# Patient Record
Sex: Female | Born: 1990 | Race: Black or African American | Hispanic: No | Marital: Single | Smoking: Current some day smoker
Health system: Southern US, Community
[De-identification: ages and names within clinical notes are randomized; demographics above are authoritative.]

## PROBLEM LIST (undated history)

## (undated) ENCOUNTER — Inpatient Hospital Stay (HOSPITAL_COMMUNITY): Payer: Self-pay

## (undated) DIAGNOSIS — O2340 Unspecified infection of urinary tract in pregnancy, unspecified trimester: Secondary | ICD-10-CM

## (undated) DIAGNOSIS — G8929 Other chronic pain: Secondary | ICD-10-CM

## (undated) DIAGNOSIS — F99 Mental disorder, not otherwise specified: Secondary | ICD-10-CM

## (undated) DIAGNOSIS — Z862 Personal history of diseases of the blood and blood-forming organs and certain disorders involving the immune mechanism: Secondary | ICD-10-CM

## (undated) DIAGNOSIS — IMO0001 Reserved for inherently not codable concepts without codable children: Secondary | ICD-10-CM

## (undated) DIAGNOSIS — F32A Depression, unspecified: Secondary | ICD-10-CM

## (undated) DIAGNOSIS — F329 Major depressive disorder, single episode, unspecified: Secondary | ICD-10-CM

## (undated) DIAGNOSIS — D649 Anemia, unspecified: Secondary | ICD-10-CM

## (undated) DIAGNOSIS — O139 Gestational [pregnancy-induced] hypertension without significant proteinuria, unspecified trimester: Secondary | ICD-10-CM

## (undated) DIAGNOSIS — M549 Dorsalgia, unspecified: Secondary | ICD-10-CM

## (undated) DIAGNOSIS — K219 Gastro-esophageal reflux disease without esophagitis: Secondary | ICD-10-CM

## (undated) DIAGNOSIS — Z5189 Encounter for other specified aftercare: Secondary | ICD-10-CM

## (undated) DIAGNOSIS — Z8759 Personal history of other complications of pregnancy, childbirth and the puerperium: Secondary | ICD-10-CM

## (undated) HISTORY — PX: FINGER SURGERY: SHX640

---
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
## 2011-01-03 IMAGING — US US OB COMP LESS 14 WK
1 series · 14 of 28 positions shown · non-contrast
Comparison: 09/24/2009

CLINICAL DATA: Early gestation with vaginal bleeding

OBSTETRIC <14 WK ULTRASOUND
TECHNIQUE: Transabdominal ultrasound was performed for evaluation
of the gestation as well as the maternal uterus and adnexal
regions.

[Series 1: us ob comp less 14 wk · 0.25mm/px · 14 of 34 slices shown]
[im 2/34]
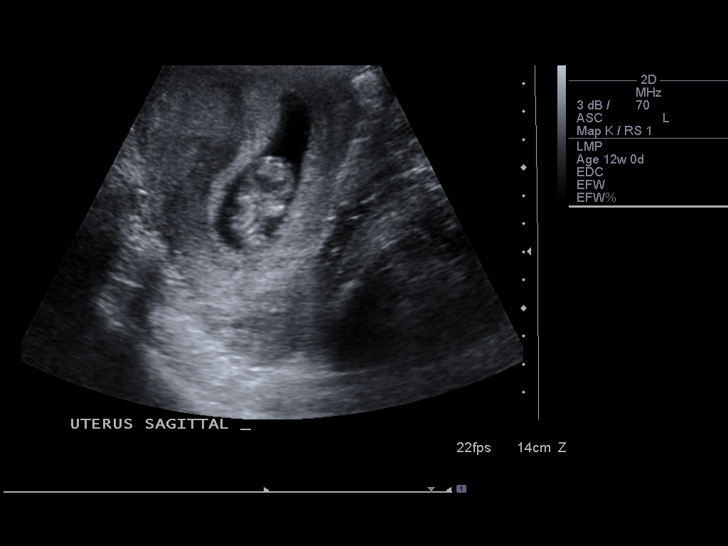
[im 4/34]
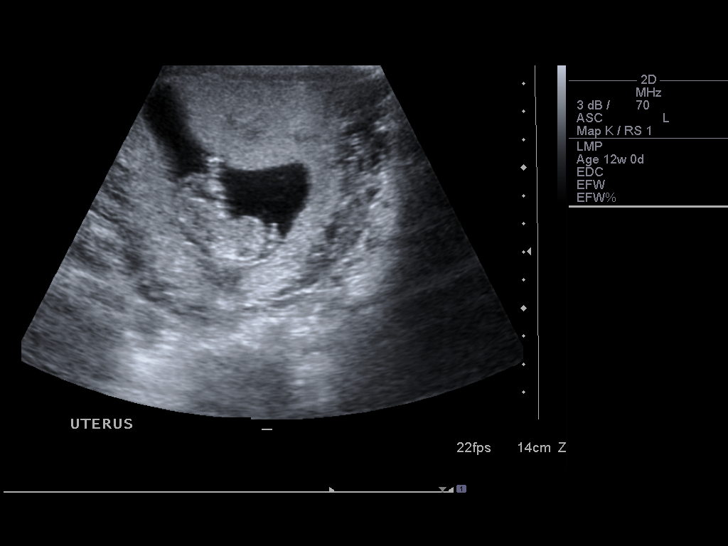
[im 7/34]
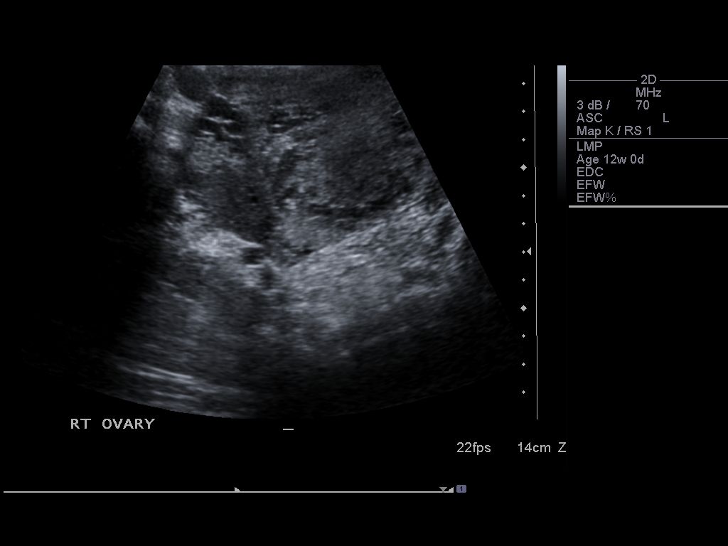
[im 9/34]
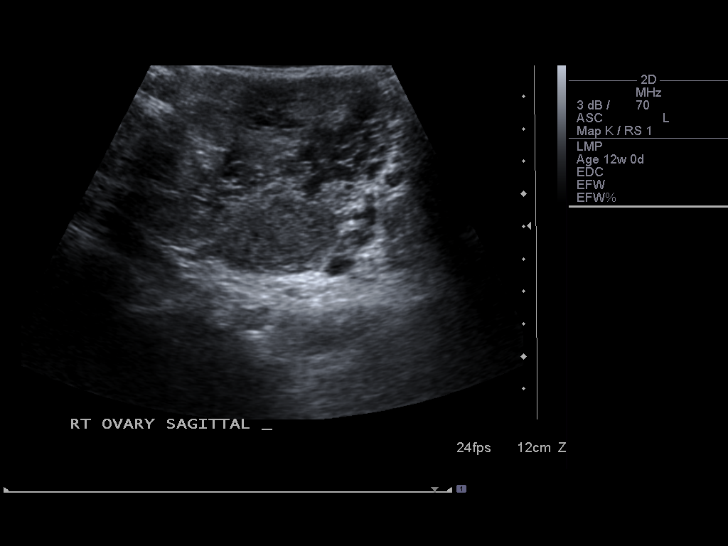
[im 12/34]
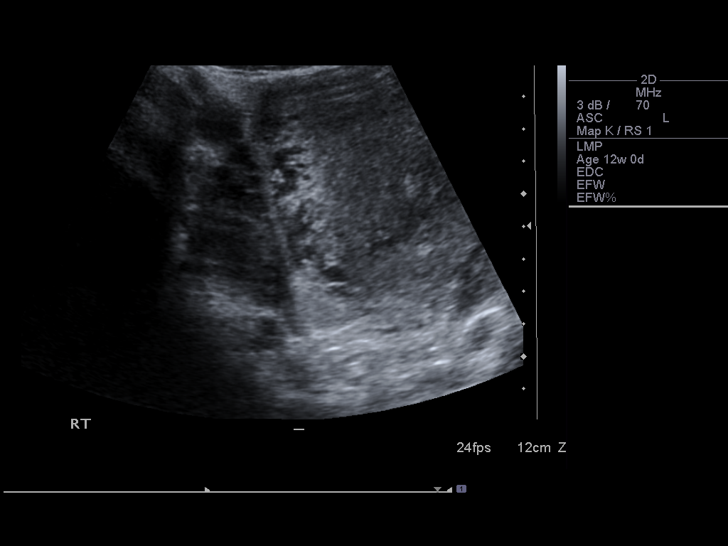
[im 14/34]
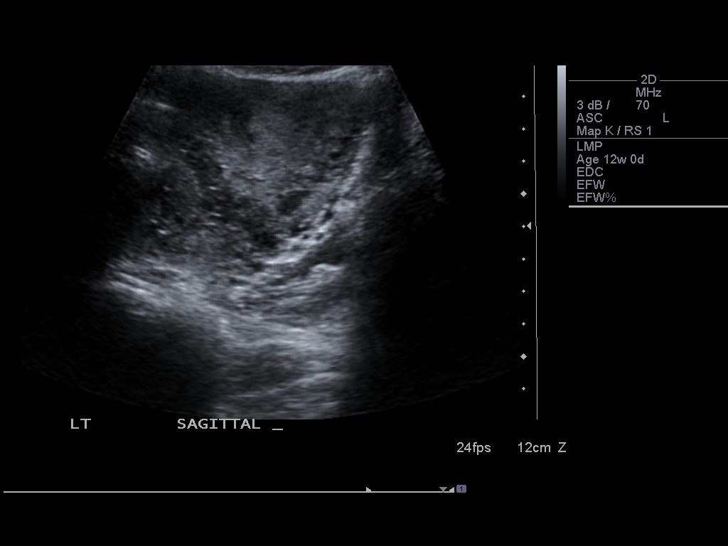
[im 16/34]
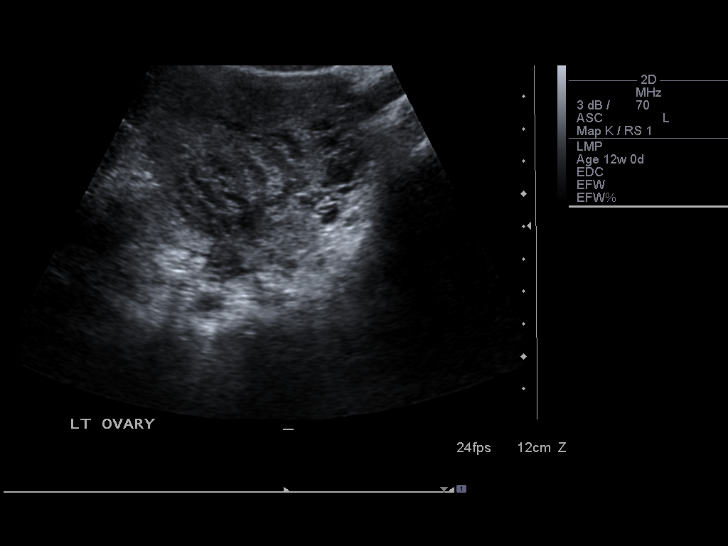
[im 19/34]
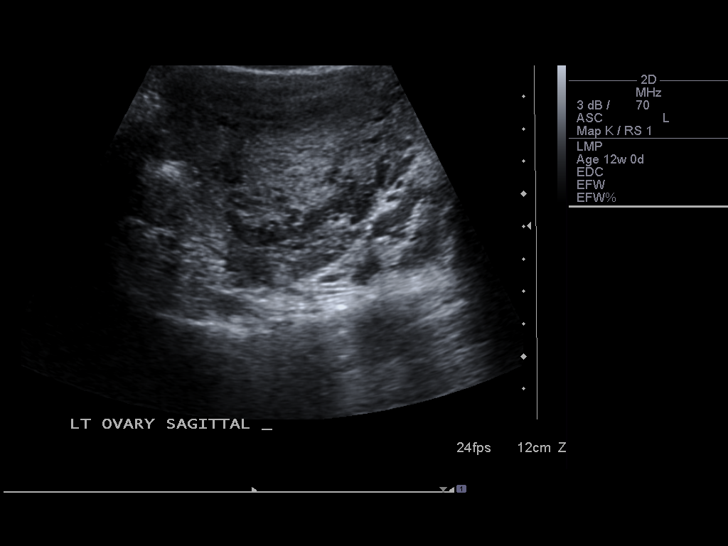
[im 21/34]
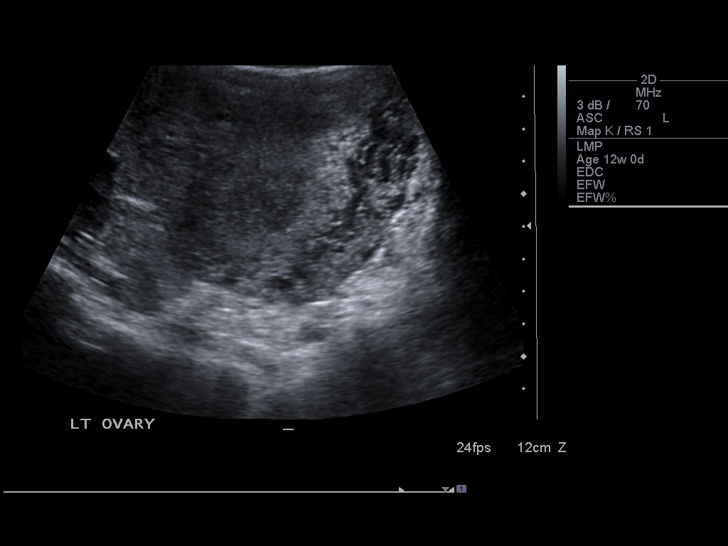
[im 24/34]
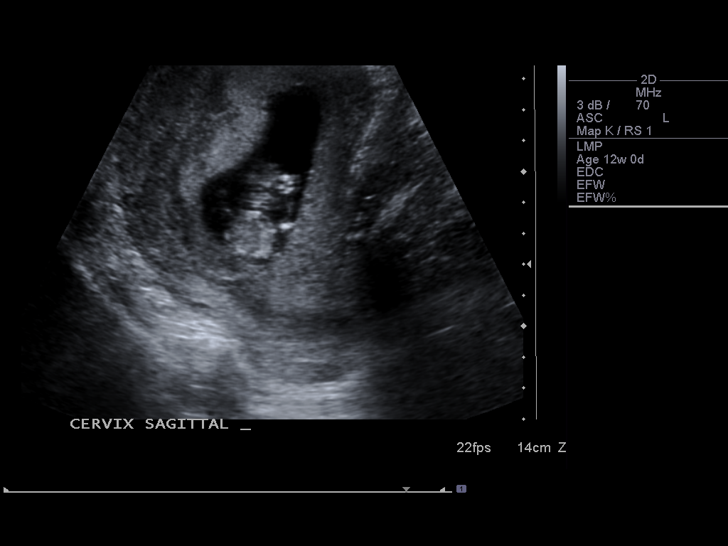
[im 26/34]
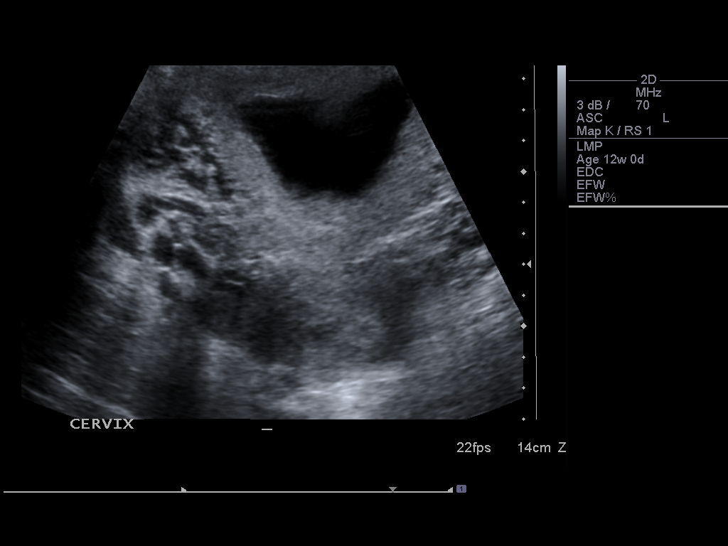
[im 29/34]
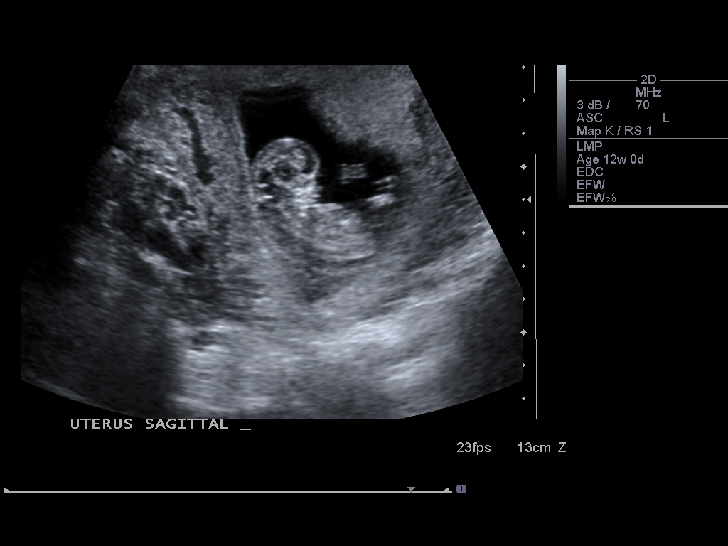
[im 31/34]
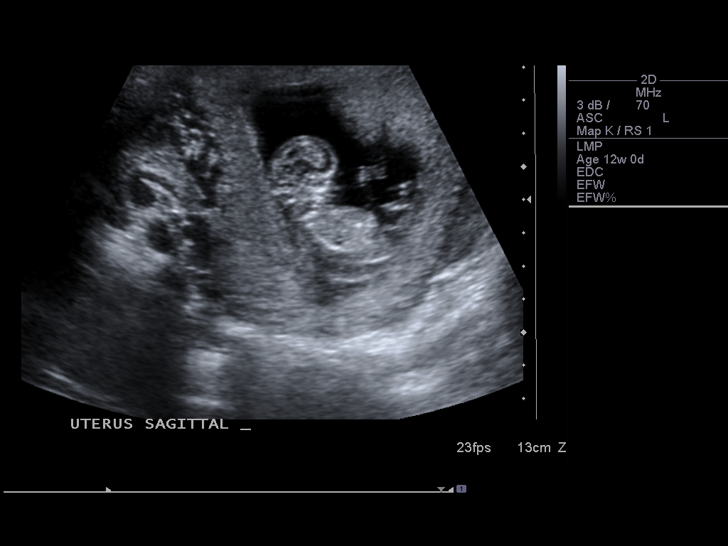
[im 34/34]
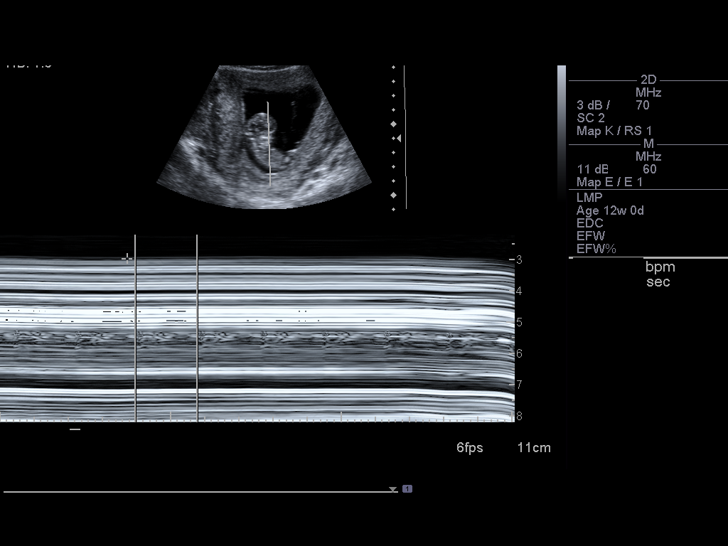

[14 of 28 positions shown; findings below may reference images not displayed]

Intrauterine gestational sac: Single
Yolk sac: Not seen
Embryo: Seen
Cardiac Activity: Seen
Heart Rate: 165 bpm

MSD:  mm  w  d
CRL:  44.8 mm         11w  2d       US EDC: 05/11/2010

Maternal uterus/Adnexae:
The ovaries appear normal with the left ovary measuring 3.5 x 1.1 x
.9 cm and the right ovary measuring 2.1 x 3.4 x 2.4 cm.  No pelvic
fluid is seen. No subchorionic hemorrhage is seen.
IMPRESSION: Single living intrauterine pregnancy demonstrating an estimated
gestational age by crown-rump length of 11 weeks 2 days.  This
correlates with expected EGA by LMP of 12w 0d.

No subchorionic hemorrhage is noted in this patient with history of
vaginal bleeding.

Normal ovaries.

## 2011-02-20 IMAGING — US US OB COMP +14 WK
1 series · 14 of 28 positions shown · non-contrast
Comparison: none

OBSTETRICAL ULTRASOUND:
 This ultrasound exam was performed in the [HOSPITAL] Ultrasound Department.  The OB US report was generated in the AS system, and faxed to the ordering physician.  This report is also available in [HOSPITAL]?s [employer] and in [REDACTED] PACS.

[Series 1: us ob comp +14 wk · 0.27mm/px · 61 acquisitions, 14 frames shown]
[im 3/61]
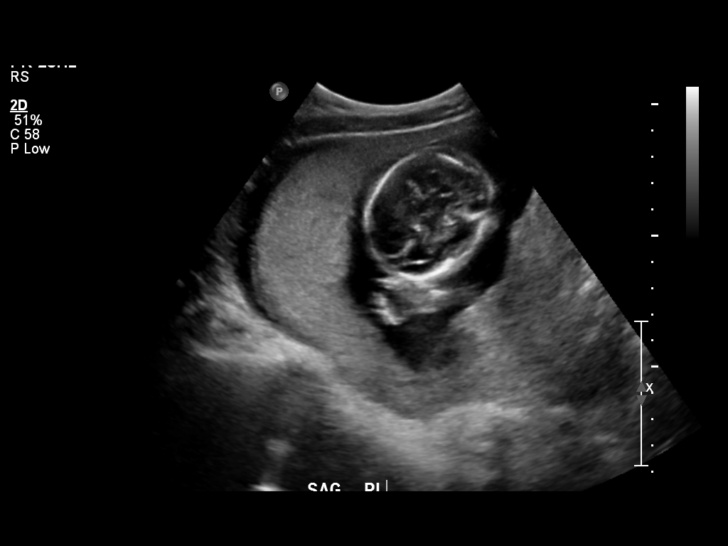
[im 7/61]
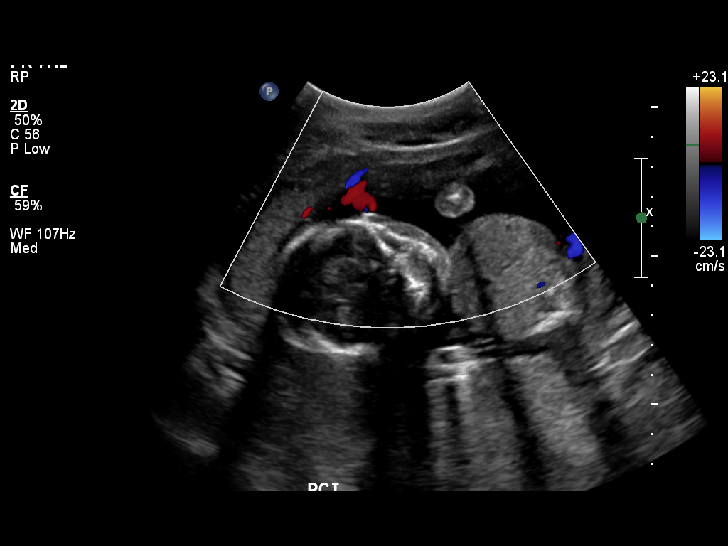
[im 12/61]
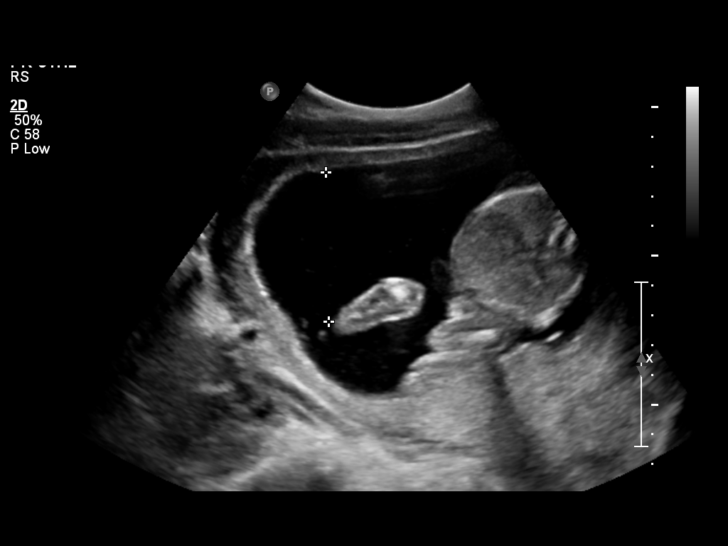
[im 16/61]
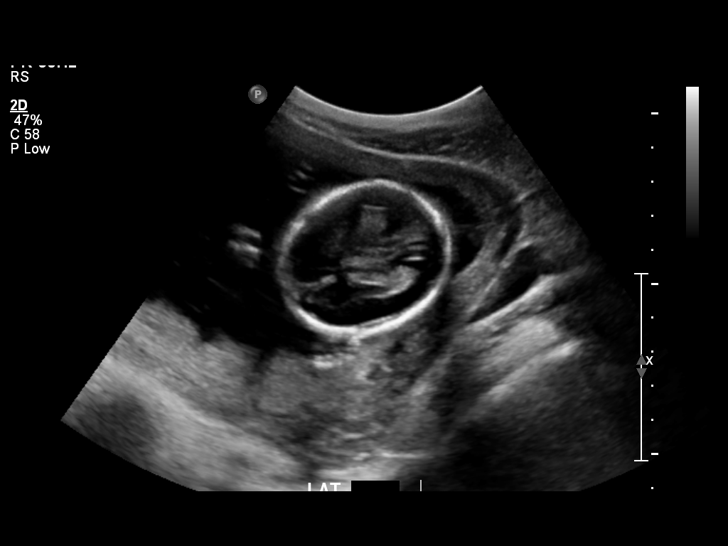
[im 21/61]
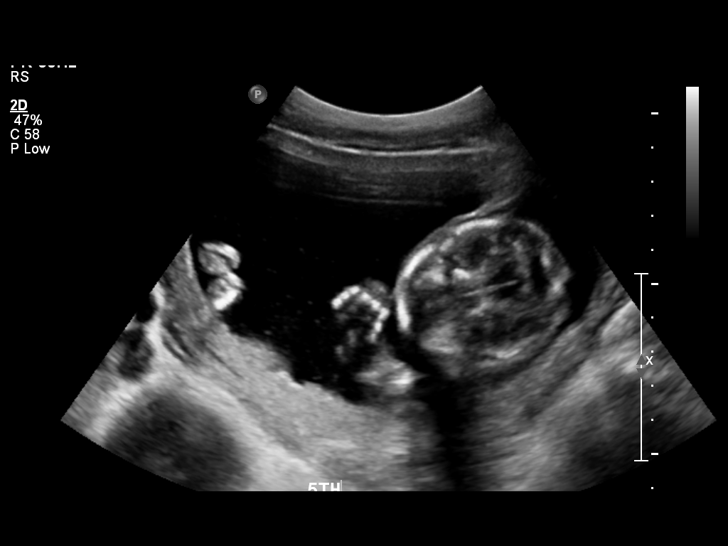
[im 25/61]
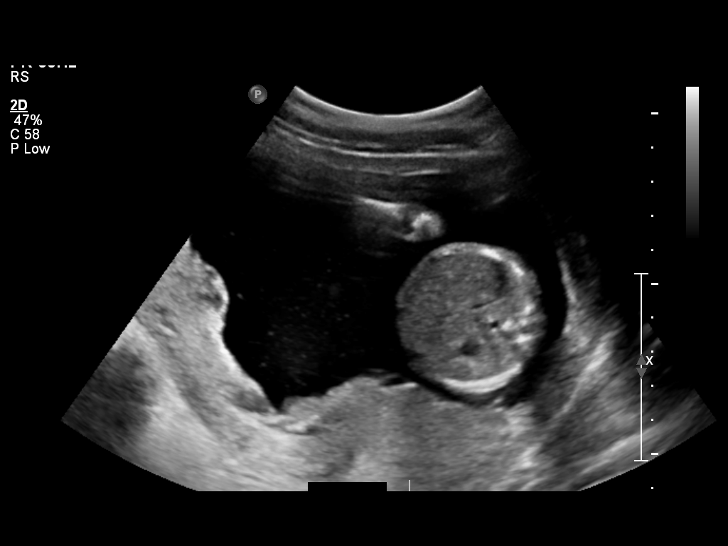
[im 29/61]
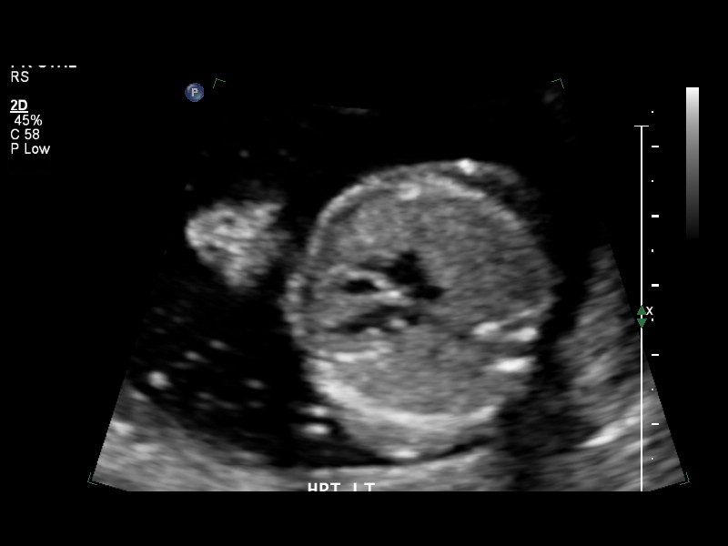
[im 34/61]
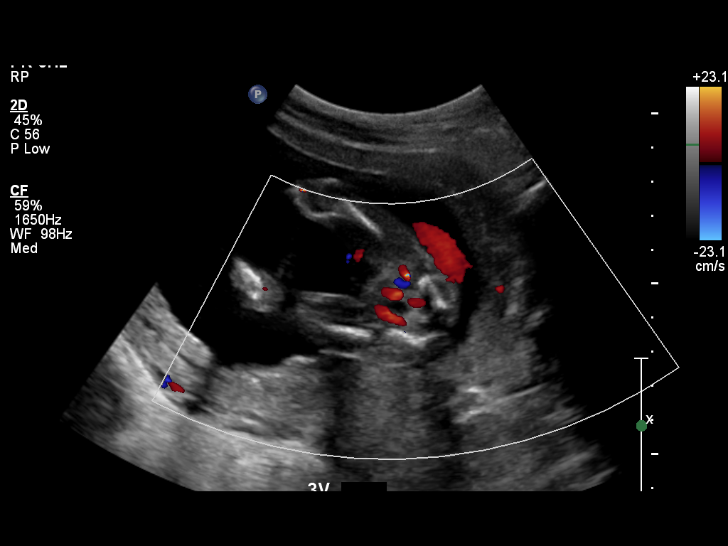
[im 38/61]
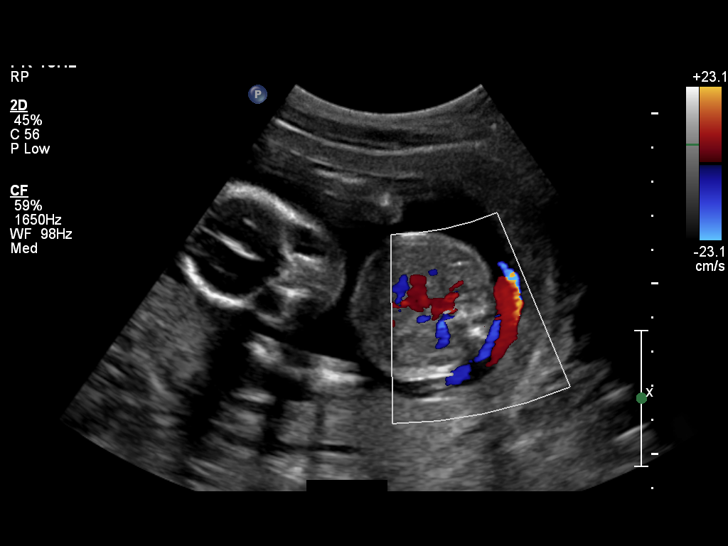
[im 43/61]
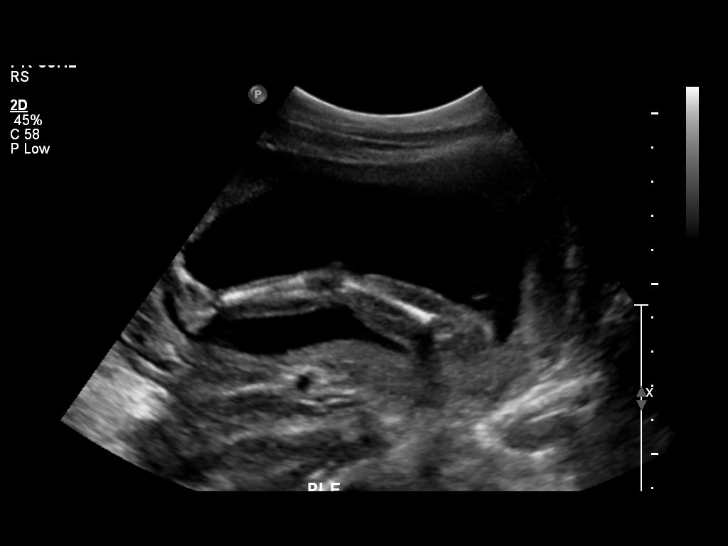
[im 47/61]
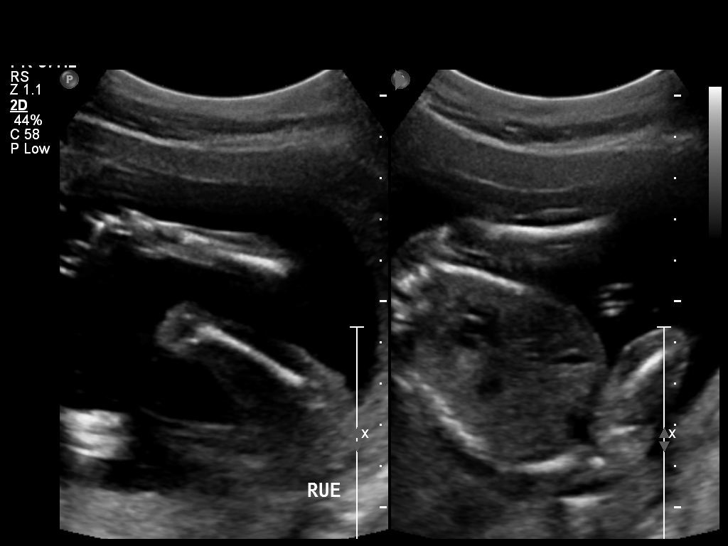
[im 52/61]
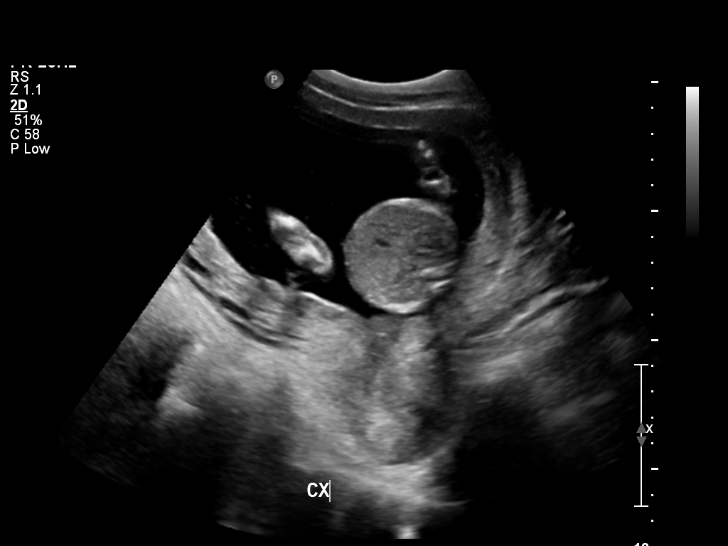
[im 56/61]
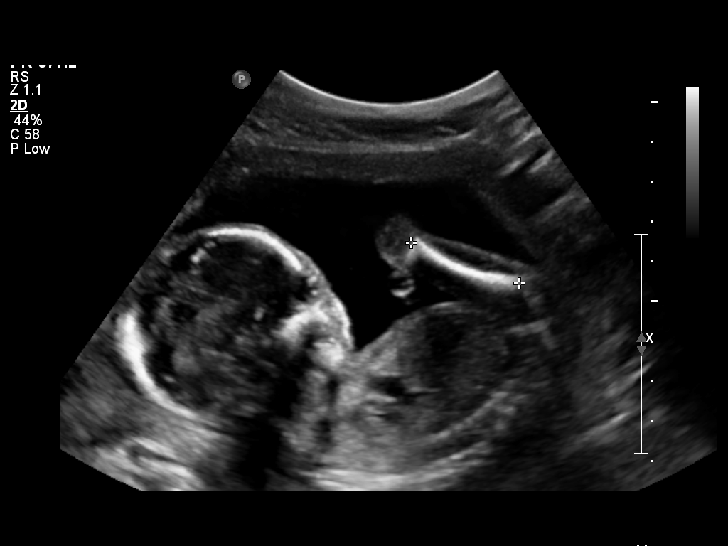
[im 61/61]
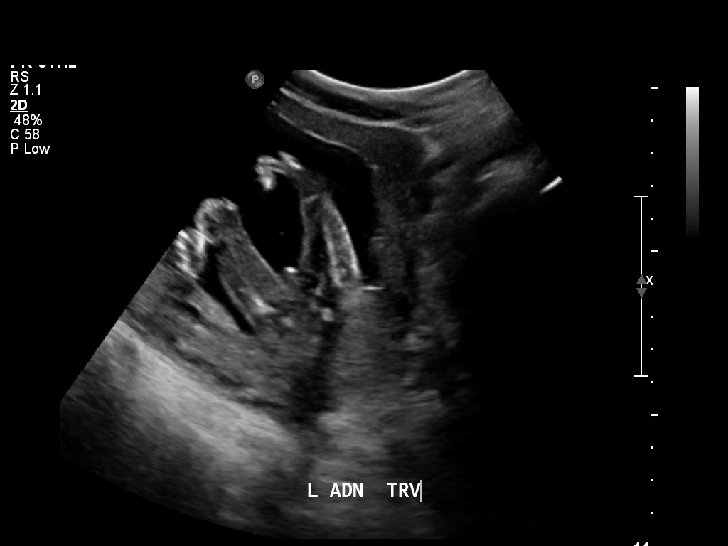

[14 of 28 positions shown; findings below may reference images not displayed]

IMPRESSION: See AS Obstetric US report.

## 2011-04-03 LAB — STREP A DNA PROBE

## 2011-04-08 LAB — URINE MICROSCOPIC-ADD ON

## 2011-04-08 LAB — URINALYSIS, ROUTINE W REFLEX MICROSCOPIC
Glucose, UA: NEGATIVE
Specific Gravity, Urine: 1.02
pH: 5.5

## 2011-04-08 LAB — DIFFERENTIAL
Basophils Relative: 0
Eosinophils Absolute: 0.1
Lymphs Abs: 2.9
Monocytes Absolute: 0.5
Monocytes Relative: 7
Neutro Abs: 3.4

## 2011-04-08 LAB — COMPREHENSIVE METABOLIC PANEL
ALT: 19
Albumin: 3.9
Alkaline Phosphatase: 97
Potassium: 3.9
Sodium: 137
Total Protein: 7.6

## 2011-04-08 LAB — CBC
Platelets: 313
RDW: 15.6 — ABNORMAL HIGH

## 2011-04-09 LAB — URINE MICROSCOPIC-ADD ON

## 2011-04-09 LAB — URINALYSIS, ROUTINE W REFLEX MICROSCOPIC
Glucose, UA: NEGATIVE
Hgb urine dipstick: NEGATIVE
Protein, ur: NEGATIVE
pH: 7

## 2011-04-15 LAB — URINALYSIS, ROUTINE W REFLEX MICROSCOPIC
Bilirubin Urine: NEGATIVE
Ketones, ur: NEGATIVE
Nitrite: NEGATIVE
Protein, ur: NEGATIVE
Urobilinogen, UA: 0.2
pH: 5.5

## 2011-06-20 ENCOUNTER — Emergency Department (HOSPITAL_COMMUNITY)
Admission: EM | Admit: 2011-06-20 | Discharge: 2011-06-21 | Discharge status: Against Medical Advice | Payer: Self-pay | Attending: Emergency Medicine | Admitting: Emergency Medicine

## 2011-06-20 ENCOUNTER — Encounter: Payer: Self-pay | Admitting: *Deleted

## 2011-06-20 DIAGNOSIS — M549 Dorsalgia, unspecified: Secondary | ICD-10-CM | POA: Insufficient documentation

## 2011-06-20 DIAGNOSIS — R109 Unspecified abdominal pain: Secondary | ICD-10-CM | POA: Insufficient documentation

## 2011-06-20 LAB — PREGNANCY, URINE: Preg Test, Ur: POSITIVE

## 2011-06-20 LAB — URINALYSIS, ROUTINE W REFLEX MICROSCOPIC
Bilirubin Urine: NEGATIVE
Glucose, UA: NEGATIVE mg/dL
Hgb urine dipstick: NEGATIVE
Specific Gravity, Urine: 1.024 (ref 1.005–1.030)
pH: 6 (ref 5.0–8.0)

## 2011-06-20 LAB — URINE MICROSCOPIC-ADD ON

## 2011-06-20 NOTE — ED Notes (Signed)
Pt c/o lower back and abd pain; denies dysuria; no period since Sept

## 2011-06-21 NOTE — ED Notes (Signed)
Pt called for rooming x5, no response.  Assumed that pt is in room as there is no response.  Advised that pt is not in room by Garrison, PA

## 2011-07-14 NOTE — L&D Delivery Note (Signed)
Delivery Note At 6:04 AM a viable and healthy female was delivered via Vaginal, Spontaneous Delivery (Presentation: ROA ).  APGAR: 8 ,9 ; weight pending .   Placenta status: Intact, Spontaneous.  Cord:  with the following complications:  Anesthesia:  None Episiotomy: None Lacerations: Intact Suture Repair: none Est. Blood Loss (mL): 300  Mom to postpartum.  Baby to nursery-stable.  Infant skin to skin with mother immediately following delivery.  LEFTWICH-KIRBY, Madhavi Hamblen 12/11/2011, 6:23 AM

## 2011-07-21 ENCOUNTER — Encounter (HOSPITAL_COMMUNITY): Payer: Self-pay

## 2011-07-21 ENCOUNTER — Inpatient Hospital Stay (HOSPITAL_COMMUNITY)
Admission: AD | Admit: 2011-07-21 | Discharge: 2011-07-21 | Disposition: A | Discharge status: Home / Self Care | Payer: Self-pay | Source: Ambulatory Visit | Attending: Obstetrics & Gynecology | Admitting: Obstetrics & Gynecology

## 2011-07-21 DIAGNOSIS — Z3201 Encounter for pregnancy test, result positive: Secondary | ICD-10-CM

## 2011-07-21 DIAGNOSIS — O2390 Unspecified genitourinary tract infection in pregnancy, unspecified trimester: Secondary | ICD-10-CM | POA: Insufficient documentation

## 2011-07-21 DIAGNOSIS — B9689 Other specified bacterial agents as the cause of diseases classified elsewhere: Secondary | ICD-10-CM

## 2011-07-21 DIAGNOSIS — R109 Unspecified abdominal pain: Secondary | ICD-10-CM | POA: Insufficient documentation

## 2011-07-21 DIAGNOSIS — A499 Bacterial infection, unspecified: Secondary | ICD-10-CM

## 2011-07-21 DIAGNOSIS — N760 Acute vaginitis: Secondary | ICD-10-CM

## 2011-07-21 HISTORY — DX: Gestational (pregnancy-induced) hypertension without significant proteinuria, unspecified trimester: O13.9

## 2011-07-21 LAB — URINE MICROSCOPIC-ADD ON

## 2011-07-21 LAB — URINALYSIS, ROUTINE W REFLEX MICROSCOPIC
Ketones, ur: NEGATIVE mg/dL
Nitrite: NEGATIVE
Protein, ur: NEGATIVE mg/dL
Urobilinogen, UA: 0.2 mg/dL (ref 0.0–1.0)

## 2011-07-21 LAB — URINE CULTURE: Culture  Setup Time: 201301090120

## 2011-07-21 LAB — WET PREP, GENITAL: Yeast Wet Prep HPF POC: NONE SEEN

## 2011-07-21 MED ORDER — METRONIDAZOLE 500 MG PO TABS: 500.0000 mg | ORAL_TABLET | Freq: Two times a day (BID) | ORAL | Status: AC

## 2011-07-21 NOTE — Progress Notes (Signed)
Patient states she has not had a period since September. Has been having lower abdominal pain off and on for three days. No bleeding or vaginal discharge at this time.

## 2011-07-21 NOTE — Progress Notes (Signed)
Pt states she has spotted intermittently since her LMP in September, last episode was Dec 2 when she spotted for 3 days, no bleeding today.
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
## 2011-07-22 NOTE — ED Provider Notes (Signed)
Attestation of Attending Supervision of Advanced Practitioner: Evaluation and management procedures were performed by the PA/NP/CNM/OB Fellow under my supervision/collaboration. Chart reviewed, and agree with management and plan.  Jaynie Collins, M.D. 07/22/2011 9:29 AM

## 2011-09-07 ENCOUNTER — Emergency Department (HOSPITAL_COMMUNITY): Payer: No Typology Code available for payment source

## 2011-09-07 ENCOUNTER — Emergency Department (HOSPITAL_COMMUNITY)
Admission: EM | Admit: 2011-09-07 | Discharge: 2011-09-07 | Disposition: A | Discharge status: Home / Self Care | Payer: No Typology Code available for payment source | Attending: Emergency Medicine | Admitting: Emergency Medicine

## 2011-09-07 ENCOUNTER — Encounter (HOSPITAL_COMMUNITY): Payer: Self-pay

## 2011-09-07 DIAGNOSIS — M545 Low back pain, unspecified: Secondary | ICD-10-CM | POA: Insufficient documentation

## 2011-09-07 DIAGNOSIS — M25559 Pain in unspecified hip: Secondary | ICD-10-CM | POA: Insufficient documentation

## 2011-09-07 DIAGNOSIS — M79609 Pain in unspecified limb: Secondary | ICD-10-CM | POA: Insufficient documentation

## 2011-09-07 DIAGNOSIS — M546 Pain in thoracic spine: Secondary | ICD-10-CM | POA: Insufficient documentation

## 2011-09-07 DIAGNOSIS — R109 Unspecified abdominal pain: Secondary | ICD-10-CM | POA: Insufficient documentation

## 2011-09-07 DIAGNOSIS — O99891 Other specified diseases and conditions complicating pregnancy: Secondary | ICD-10-CM | POA: Insufficient documentation

## 2011-09-07 DIAGNOSIS — R10819 Abdominal tenderness, unspecified site: Secondary | ICD-10-CM | POA: Insufficient documentation

## 2011-09-07 DIAGNOSIS — M542 Cervicalgia: Secondary | ICD-10-CM | POA: Insufficient documentation

## 2011-09-07 MED ORDER — ACETAMINOPHEN 325 MG PO TABS
650.0000 mg | ORAL_TABLET | Freq: Once | ORAL | Status: AC
  Administered 2011-09-07: 650 mg via ORAL
  Filled 2011-09-07: qty 2
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
## 2011-09-07 NOTE — ED Notes (Signed)
Ob nurse at bs. Pt fetal monitoring being done. Pt remains with c collar on. Voices no c/o any.
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
## 2011-09-07 NOTE — ED Notes (Signed)
Ob nurse to delivering fetal heart monitoring strip to ob MD.
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
## 2011-10-05 ENCOUNTER — Emergency Department (HOSPITAL_COMMUNITY)
Admission: EM | Admit: 2011-10-05 | Discharge: 2011-10-05 | Disposition: A | Discharge status: Home / Self Care | Payer: No Typology Code available for payment source | Attending: Emergency Medicine | Admitting: Emergency Medicine

## 2011-10-05 ENCOUNTER — Encounter (HOSPITAL_COMMUNITY): Payer: Self-pay | Admitting: Emergency Medicine

## 2011-10-05 DIAGNOSIS — F3289 Other specified depressive episodes: Secondary | ICD-10-CM | POA: Insufficient documentation

## 2011-10-05 DIAGNOSIS — R109 Unspecified abdominal pain: Secondary | ICD-10-CM

## 2011-10-05 DIAGNOSIS — F329 Major depressive disorder, single episode, unspecified: Secondary | ICD-10-CM | POA: Insufficient documentation

## 2011-10-05 DIAGNOSIS — R1110 Vomiting, unspecified: Secondary | ICD-10-CM | POA: Insufficient documentation

## 2011-10-05 DIAGNOSIS — O99891 Other specified diseases and conditions complicating pregnancy: Secondary | ICD-10-CM | POA: Insufficient documentation

## 2011-10-05 DIAGNOSIS — K219 Gastro-esophageal reflux disease without esophagitis: Secondary | ICD-10-CM | POA: Insufficient documentation

## 2011-10-05 HISTORY — DX: Depression, unspecified: F32.A

## 2011-10-05 HISTORY — DX: Major depressive disorder, single episode, unspecified: F32.9

## 2011-10-05 HISTORY — DX: Reserved for inherently not codable concepts without codable children: IMO0001

## 2011-10-05 HISTORY — DX: Gastro-esophageal reflux disease without esophagitis: K21.9

## 2011-10-05 HISTORY — DX: Encounter for other specified aftercare: Z51.89

## 2011-10-05 HISTORY — DX: Mental disorder, not otherwise specified: F99

## 2011-10-05 LAB — GLUCOSE, CAPILLARY: Glucose-Capillary: 119 mg/dL — ABNORMAL HIGH (ref 70–99)

## 2011-10-05 LAB — URINALYSIS, ROUTINE W REFLEX MICROSCOPIC
Bilirubin Urine: NEGATIVE
Glucose, UA: NEGATIVE mg/dL
Hgb urine dipstick: NEGATIVE
Ketones, ur: 15 mg/dL — AB
Nitrite: NEGATIVE
Protein, ur: NEGATIVE mg/dL
Specific Gravity, Urine: 1.024 (ref 1.005–1.030)
Urobilinogen, UA: 1 mg/dL (ref 0.0–1.0)
pH: 7.5 (ref 5.0–8.0)

## 2011-10-05 LAB — COMPREHENSIVE METABOLIC PANEL
ALT: 10 U/L (ref 0–35)
AST: 13 U/L (ref 0–37)
Albumin: 2.6 g/dL — ABNORMAL LOW (ref 3.5–5.2)
Alkaline Phosphatase: 82 U/L (ref 39–117)
BUN: 5 mg/dL — ABNORMAL LOW (ref 6–23)
CO2: 24 mEq/L (ref 19–32)
Calcium: 8.9 mg/dL (ref 8.4–10.5)
Chloride: 102 mEq/L (ref 96–112)
Creatinine, Ser: 0.52 mg/dL (ref 0.50–1.10)
GFR calc Af Amer: >90 mL/min (ref >90)
GFR calc non Af Amer: >90 mL/min (ref >90)
Glucose, Bld: 37 mg/dL — CL (ref 70–99)
Potassium: 3.4 mEq/L — ABNORMAL LOW (ref 3.5–5.1)
Sodium: 135 mEq/L (ref 135–145)
Total Bilirubin: 0.3 mg/dL (ref 0.3–1.2)
Total Protein: 6.6 g/dL (ref 6.0–8.3)

## 2011-10-05 LAB — CBC
HCT: 32.4 % — ABNORMAL LOW (ref 36.0–46.0)
Hemoglobin: 10.8 g/dL — ABNORMAL LOW (ref 12.0–15.0)
MCH: 27.1 pg (ref 26.0–34.0)
MCHC: 33.3 g/dL (ref 30.0–36.0)
MCV: 81.4 fL (ref 78.0–100.0)
Platelets: 230 10*3/uL (ref 150–400)
RBC: 3.98 MIL/uL (ref 3.87–5.11)
RDW: 14.6 % (ref 11.5–15.5)
WBC: 7 10*3/uL (ref 4.0–10.5)

## 2011-10-05 LAB — URINE MICROSCOPIC-ADD ON

## 2011-10-05 LAB — LIPASE, BLOOD: Lipase: 20 U/L (ref 11–59)

## 2011-10-05 MED ORDER — LIDOCAINE VISCOUS 2 % MT SOLN: 5.0000 mL | Freq: Once | OROMUCOSAL | Status: DC

## 2011-10-05 MED ORDER — CEPHALEXIN 500 MG PO CAPS: 500.0000 mg | ORAL_CAPSULE | Freq: Four times a day (QID) | ORAL | Status: AC

## 2011-10-05 MED ORDER — FAMOTIDINE IN NACL 20-0.9 MG/50ML-% IV SOLN: 20.0000 mg | Freq: Once | INTRAVENOUS | Status: DC

## 2011-10-05 MED ORDER — DEXTROSE 50 % IV SOLN
INTRAVENOUS | Status: AC
  Administered 2011-10-05: 50 mL via INTRAVENOUS
  Filled 2011-10-05: qty 50

## 2011-10-05 MED ORDER — MORPHINE SULFATE 4 MG/ML IJ SOLN: 6.0000 mg | Freq: Once | INTRAMUSCULAR | Status: DC

## 2011-10-05 MED ORDER — ACETAMINOPHEN 325 MG PO TABS
650.0000 mg | ORAL_TABLET | Freq: Once | ORAL | Status: AC
  Administered 2011-10-05: 650 mg via ORAL
  Filled 2011-10-05: qty 2

## 2011-10-05 MED ORDER — DEXTROSE 50 % IV SOLN
50.0000 mL | Freq: Once | INTRAVENOUS | Status: AC
  Administered 2011-10-05: 50 mL via INTRAVENOUS

## 2011-10-05 MED ORDER — ONDANSETRON HCL 4 MG/2ML IJ SOLN: 4.0000 mg | Freq: Once | INTRAMUSCULAR | Status: DC

## 2011-10-05 NOTE — ED Notes (Signed)
V.O taken from Dr. Judd Lien to give an amp of D50 for CBG of 37 per lab. Pt in no distress at this time. Vital signs stable.

## 2011-10-05 NOTE — ED Notes (Addendum)
Went to room to give pt medications and d/c papers and she was not in room.  Called to front desk, and they stated a pt with that description had left.

## 2011-10-05 NOTE — Progress Notes (Signed)
Plan of care discussed with pt at this time.  Pt's questions answered in full and to her verbalized understanding.  Pt verbalizes understanding of and agreement with her care plan. Pt instructed to keep her next regularly scheduled OB appointment.  Pt verbalizes intent to keep said appointment. PTL precautions and preeclampsia precautions reviewed with pt at this time.  Pt encouraged to go to [facility] to be evaluated should she experience any s/s of PTL, abdominal cramping, uterine contractions, vaginal bleeding, any leaking of fluid, or decreased fetal movement, sudden weight gain or swelling, HAs unrelieved by Tylenol, visual changes, epigastric pain unrelieved by antacids.  Pt verbalizes understanding of and agreement with both her dc instructions and her plan of care.

## 2011-10-05 NOTE — ED Notes (Signed)
Fetal Heart Rate monitored with doppler.  FHR ranged 140's to 160's.  Variability present. Accelerations present.
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
## 2011-10-05 NOTE — ED Provider Notes (Cosign Needed)
History    -year-old female with lower, pain. Gradual onset last night. Pain is crampy across lower abdomen and radiates to her left side into her back. Denies trauma. Patient is pregnant. Proximal to 20 weeks. No leakage of fluid or bleeding. Does not contractions. Is feeling the baby move. Patient has a history preterm labor. No urinary complaints. Nausea but no vomiting. No diarrhea. No sick contacts. CSN: [id number]  Arrival date & time 10/05/11  1308   First MD Initiated Contact with Patient 10/05/11 1600      Chief Complaint  Patient presents with  . Abdominal Pain    (Consider location/radiation/quality/duration/timing/severity/associated sxs/prior treatment) HPI  Past Medical History  Diagnosis Date  . Pregnancy induced hypertension     Past Surgical History  Procedure Date  . Finger surgery     History reviewed. No pertinent family history.  History  Substance Use Topics  . Smoking status: Never Smoker   . Smokeless tobacco: Not on file  . Alcohol Use: No     last alcohol in September    OB History    Grav Para Term Preterm Abortions TAB SAB Ect Mult Living   3 2 1 1      2       Review of Systems   Review of symptoms negative unless otherwise noted in HPI.   Allergies  Review of patient's allergies indicates no known allergies.  Home Medications   Current Outpatient Rx  Name Route Sig Dispense Refill  . ACETAMINOPHEN 325 MG PO TABS Oral Take 325 mg by mouth every 6 (six) hours as needed. As needed for pain.    Marland Kitchen PRENATAL MULTIVITAMIN CH Oral Take 1 tablet by mouth daily.      BP 129/64  Pulse 79  Temp(Src) 98.1 F (36.7 C) (Oral)  Resp 18  SpO2 100%  LMP 03/14/2011  Physical Exam  Nursing note and vitals reviewed. Constitutional: She appears well-developed and well-nourished. No distress.       Laying in bed. No acute distress.  HENT:  Head: Normocephalic and atraumatic.  Eyes: Conjunctivae are normal. Right eye exhibits no discharge.  Left eye exhibits no discharge.  Neck: Neck supple.  Cardiovascular: Normal rate, regular rhythm and normal heart sounds.  Exam reveals no gallop and no friction rub.   No murmur heard. Pulmonary/Chest: Effort normal and breath sounds normal. No respiratory distress.  Abdominal: Soft. There is tenderness.       Gravid uterus with fundus palpable above the umbilicus. Mild tenderness in the left lower quadrant without guarding or rebound.  Genitourinary:       No costovertebral angle tenderness to  Musculoskeletal: She exhibits no edema and no tenderness.  Neurological: She is alert.  Skin: Skin is warm and dry. She is not diaphoretic.  Psychiatric: She has a normal mood and affect. Her behavior is normal. Thought content normal.    ED Course  Procedures (including critical care time)  Labs Reviewed  URINALYSIS, ROUTINE W REFLEX MICROSCOPIC - Abnormal; Notable for the following:    APPearance TURBID (*)    Ketones, ur 15 (*)    Leukocytes, UA MODERATE (*)    All other components within normal limits  URINE MICROSCOPIC-ADD ON - Abnormal; Notable for the following:    Squamous Epithelial / LPF FEW (*)    Bacteria, UA FEW (*)    All other components within normal limits  CBC - Abnormal; Notable for the following:    Hemoglobin 10.8 (*)  HCT 32.4 (*)    All other components within normal limits  COMPREHENSIVE METABOLIC PANEL - Abnormal; Notable for the following:    Potassium 3.4 (*)    Glucose, Bld 37 (*)    BUN 5 (*)    Albumin 2.6 (*)    All other components within normal limits  GLUCOSE, CAPILLARY - Abnormal; Notable for the following:    Glucose-Capillary 119 (*)    All other components within normal limits  LIPASE, BLOOD  LAB REPORT - SCANNED   No results found.   1. Abdominal  pain, other specified site   2. Asymptomatic bacteriuria in pregnancy     5:52 PM Pt now with increased abdominal pain. L sided on initial exam but now currently more epigastric. Still  denies feeling contractions. No leakage of fluid or bleeding. RR team called to place on fetal monitoring. IV access. Labs ordered. Pain meds. Will move to CDU. Etiology unclear but will w/u further. Consider dissection given back pain initially and at increased risk for in pregnancy but suspicion still remains low.  Consider biliary colic. Doubt renal colic although no blood on UA. Remains normotensive with normal HR.   MDM  21 year old female with abdominal pain and back pain. Etiology not completely clear but very low suspicion for surgical abdomen. Patient is pregnant she is placed on monitoring for several hours in the emergency department and observation was reassuring. Reports feeling much better prior to discharge. No new complaints prior to DC. Abdominal exam markedly improved prior to DC. Term precautions were discussed.        Raeford Razor, MD 10/16/11 [id number]

## 2011-10-05 NOTE — Progress Notes (Addendum)
Call received at 1743.  Arrived to [facility] at 1759.  Pt awaiting bed.  Pt in no apparent distress at this time.  G3P2 EDC 12/19/11 [place] 29.2 PNC at [facility])  Last appt 09/25/2011.  Pt has appt scheduled for 04/13 but cannot remember date.  NKDA/NKFA/No latex allergies  Med hx:  Depression (not on meds)                Migraines (not on meds)                Blood Transfusion 2010  Surgeries:  Right thumb repair after nail was driven through it in childhood  OB/GYN:  SVD x 2                  PTD with first delivery (no infant complications)                  H/o PreE on mag with second pregnancy in 2010.  Pt states blood transfusion post delivery in 2010.  Meds:   PNV 1 PO daily last dose 10/05/11              Tylenol 1-2 tabs PO PRN for HA.Marland Kitchen  Last dose 10/04/11  Pt presents with c/o constant upper abdominal pain since 0930 this AM.  Pt states she does have a h/o GERD but has not taken any antacids and is not on any RX for GERD.  Pt states she vomited last night x 1, but has not had any nausea or vomiting since.  Abdomen soft and nontender upon palpation.  Pt states no visual disturbances, but does report a HA that was relieved by Tylenol last night.  No current HA.  DTRs 2+ BLE.  No clonus noted.  VSS.  Pt states normal fetal movement.  Pt denies UCs, abdominal cramping or tightness.  Pt denies LOF or vaginal bleeding.  FHT 125 upon placement of EFM.  Copious amounts of fetal movement noted upon palpation and audible with EFM.  RN inspection reveals no evidence of vaginal bleeding or LOF.  SVE deferred per RN judgement.  Dr Christell Constant updated re pt presence, status, OB/med hx, Meds, presenting complaint.  VSS.  Labs ordred.

## 2011-10-05 NOTE — Progress Notes (Signed)
Dr Christell Constant updated re FHT, absence of UCs, and lab results.  Pt cleared from OB standpoint by Dr Christell Constant at this time.

## 2011-10-05 NOTE — ED Notes (Signed)
Pt states she feels much better after D50.  ao x 4.  Given juice.  Awaiting d/c papers per Dr Juleen China.

## 2011-10-05 NOTE — ED Notes (Signed)
Papers taken to front desk

## 2011-10-05 NOTE — Discharge Instructions (Signed)
Abdominal Pain Abdominal pain can be caused by many things. Your caregiver decides the seriousness of your pain by an examination and possibly blood tests and X-rays. Many cases can be observed and treated at home. Most abdominal pain is not caused by a disease and will probably improve without treatment. However, in many cases, more time must pass before a clear cause of the pain can be found. Before that point, it may not be known if you need more testing, or if hospitalization or surgery is needed. HOME CARE INSTRUCTIONS   Do not take laxatives unless directed by your caregiver.   Take pain medicine only as directed by your caregiver.   Only take over-the-counter or prescription medicines for pain, discomfort, or fever as directed by your caregiver.   Try a clear liquid diet (broth, tea, or water) for as long as directed by your caregiver. Slowly move to a bland diet as tolerated.  SEEK IMMEDIATE MEDICAL CARE IF:   The pain does not go away.   You have a fever.   You keep throwing up (vomiting).   The pain is felt only in portions of the abdomen. Pain in the right side could possibly be appendicitis. In an adult, pain in the left lower portion of the abdomen could be colitis or diverticulitis.   You pass bloody or black tarry stools.  MAKE SURE YOU:   Understand these instructions.   Will watch your condition.   Will get help right away if you are not doing well or get worse.  Document Released: 04/08/2005 Document Revised: 06/18/2011 Document Reviewed: 02/15/2008 [facility] Patient Information 2012 [place], [place].Abdominal Pain During Pregnancy Belly (abdominal) pain is common during pregnancy. Most of the time, it is not a serious problem. Other times, it can be a sign that something is wrong with the pregnancy. Always tell your doctor if you have belly pain. HOME CARE For mild pain:  Do not have sex (intercourse) or put anything in your vagina until you feel better.   Rest  until your pain stops. If your pain lasts longer than 1 hour, call your doctor.   Drink clear fluids if you feel sick to your stomach (nauseous).   Do not eat solid food until you feel better.   Only take medicine as told by your doctor.   Keep all doctor visits as told.  GET HELP RIGHT AWAY IF:   You are bleeding, leaking fluid, or pieces of tissue come out of your vagina.   You have more pain or cramping.   You keep throwing up (vomiting).   You have pain when you pee (urinate) or have blood in your pee.   You have a fever.   You do not feel your baby moving as much.   You feel very weak or feel like passing out.   You have trouble breathing, with or without belly pain.   You have a very bad headache and belly pain.   You have fluid leaking from your vagina and belly pain.   You keep having watery poop (diarrhea).   Your belly pain does not go away after resting, or the pain gets worse.  MAKE SURE YOU:   Understand these instructions.   Will watch your condition.   Will get help right away if you are not doing well or get worse.  Document Released: 06/17/2009 Document Revised: 06/18/2011 Document Reviewed: 01/23/2011 [facility] Patient Information 2012 [place], [place].  RESOURCE GUIDE  Dental Problems  Patients with Medicaid:  [facility] [phone]                                           [id number] [employer] Phone:  [phone]                                                  Phone:  [phone]  If unable to pay or uninsured, contact:  [facility] or [facility]. to become qualified for the adult dental clinic.  Chronic Pain Problems Contact [facility]  [phone] Patients need to be referred by their primary care doctor.  Insufficient Money for Medicine Contact United Way:  call "211" or [facility] [phone].  No Primary Care Doctor Call [facility]  [phone] Other agencies that provide inexpensive medical care    [facility]  [phone]    [facility]  [phone]    [facility]  [phone]    [facility]  [phone]    [facility]  [phone]    [facility]  [phone]  Psychological Services [facility]  [phone] [facility]  [phone] [facility]   [phone] (emergency services [phone])  Substance Abuse Resources [facility] Drug Services  [phone] [facility] [phone] [facility] [place] [phone] Floydene Flock [phone] Residential & Outpatient Substance Abuse Program  [phone]  Abuse/Neglect [facility] Child Abuse Hotline [phone] [facility] Child Abuse Hotline 518-049-6010 (After Hours)  Emergency Shelter [facility] [phone]  Maternity Homes [facility] at the [place] [facility] [place] [phone] [facility] [phone]  MRSA Hotline #:   (630)555-9188    [facility] Resources  [facility]     [employer]                          [facility] Dept. [address] [place]                       [address]      [address]  Blondell Reveal Phone:  [phone]                                   Phone:  [phone]                 Phone:  [phone]  [facility] Mental Health Phone:  [phone]  [facility] Child Abuse Hotline 731-022-8030 [phone]  [phone] (After Hours)

## 2011-10-05 NOTE — ED Notes (Signed)
Called [facility] RN.  She will be here in about 15 minutes.

## 2011-10-16 ENCOUNTER — Encounter (HOSPITAL_COMMUNITY): Payer: Self-pay | Admitting: *Deleted

## 2011-10-16 ENCOUNTER — Inpatient Hospital Stay (HOSPITAL_COMMUNITY)
Admission: AD | Admit: 2011-10-16 | Discharge: 2011-10-18 | DRG: 778 | Disposition: A | Discharge status: Home / Self Care | Payer: Medicaid Other | Source: Ambulatory Visit | Attending: Obstetrics & Gynecology | Admitting: Obstetrics & Gynecology

## 2011-10-16 DIAGNOSIS — O4700 False labor before 37 completed weeks of gestation, unspecified trimester: Secondary | ICD-10-CM

## 2011-10-16 LAB — CBC
MCHC: 32.9 g/dL (ref 30.0–36.0)
Platelets: 274 10*3/uL (ref 150–400)
RDW: 14.3 % (ref 11.5–15.5)

## 2011-10-16 LAB — DIFFERENTIAL
Eosinophils Absolute: 0.1 10*3/uL (ref 0.0–0.7)
Lymphocytes Relative: 35 % (ref 12–46)
Lymphs Abs: 3.1 10*3/uL (ref 0.7–4.0)
Neutro Abs: 4.9 10*3/uL (ref 1.7–7.7)
Neutrophils Relative %: 56 % (ref 43–77)

## 2011-10-16 LAB — URINALYSIS, ROUTINE W REFLEX MICROSCOPIC
Protein, ur: NEGATIVE mg/dL
Urobilinogen, UA: 0.2 mg/dL (ref 0.0–1.0)

## 2011-10-16 LAB — WET PREP, GENITAL

## 2011-10-16 LAB — URINE MICROSCOPIC-ADD ON

## 2011-10-16 LAB — CULTURE, BETA STREP (GROUP B ONLY)

## 2011-10-16 MED ORDER — MAGNESIUM SULFATE BOLUS VIA INFUSION
4.0000 g | Freq: Once | INTRAVENOUS | Status: AC
  Administered 2011-10-16: 4 g via INTRAVENOUS
  Filled 2011-10-16: qty 500

## 2011-10-16 MED ORDER — MAGNESIUM SULFATE 40 G IN LACTATED RINGERS - SIMPLE
2.0000 g/h | INTRAVENOUS | Status: AC
  Filled 2011-10-16: qty 500

## 2011-10-16 MED ORDER — BETAMETHASONE SOD PHOS & ACET 6 (3-3) MG/ML IJ SUSP
12.0000 mg | INTRAMUSCULAR | Status: AC
  Administered 2011-10-16 – 2011-10-17 (×2): 12 mg via INTRAMUSCULAR
  Filled 2011-10-16 (×2): qty 2

## 2011-10-16 MED ORDER — DOCUSATE SODIUM 100 MG PO CAPS
100.0000 mg | ORAL_CAPSULE | Freq: Every day | ORAL | Status: DC
  Administered 2011-10-17 – 2011-10-18 (×2): 100 mg via ORAL
  Filled 2011-10-16 (×2): qty 1

## 2011-10-16 MED ORDER — LACTATED RINGERS IV SOLN
INTRAVENOUS | Status: DC
  Administered 2011-10-16 – 2011-10-18 (×5): via INTRAVENOUS

## 2011-10-16 MED ORDER — ACETAMINOPHEN 325 MG PO TABS: 650.0000 mg | ORAL_TABLET | ORAL | Status: DC | PRN

## 2011-10-16 MED ORDER — CALCIUM CARBONATE ANTACID 500 MG PO CHEW: 2.0000 | CHEWABLE_TABLET | ORAL | Status: DC | PRN

## 2011-10-16 MED ORDER — ZOLPIDEM TARTRATE 10 MG PO TABS
10.0000 mg | ORAL_TABLET | Freq: Every evening | ORAL | Status: DC | PRN
  Administered 2011-10-16: 10 mg via ORAL
  Filled 2011-10-16: qty 1

## 2011-10-16 MED ORDER — PRENATAL MULTIVITAMIN CH
1.0000 | ORAL_TABLET | Freq: Every day | ORAL | Status: DC
  Administered 2011-10-17 – 2011-10-18 (×2): 1 via ORAL
  Filled 2011-10-16 (×2): qty 1

## 2011-10-16 NOTE — H&P (Signed)
[name] is a 21 y.o. female presenting for preterm labor due to +FFM.  Maternal Medical History:  Reason for admission: Reason for Admission:   nauseaContractions and + FFN  Contractions: Onset was 6-12 hours ago.   Frequency: regular.   Perceived severity is moderate.   Contractions every 2-44min  Fetal activity: Perceived fetal activity is normal.   Last perceived fetal movement was within the past hour.    Prenatal complications: Preterm labor.   No substance abuse.     OB History    Grav Para Term Preterm Abortions TAB SAB Ect Mult Living   3 2 1 1      2      Past Medical History  Diagnosis Date  . Blood transfusion     2010 s/p SVD  . Preterm labor     first pregnancy  . Migraines     not on meds  . GERD (gastroesophageal reflux disease)   . Mental disorder   . Depression     not on meds  . Pregnancy induced hypertension 2010    required Mag Sulfate   Past Surgical History  Procedure Date  . Finger surgery     right thumb   Family History: family history includes Asthma in her mother; Diabetes in her father; Hypertension in her father; and Mental illness in her brother. Social History:  reports that she has never smoked. She has never used smokeless tobacco. She reports that she does not drink alcohol or use illicit drugs.  Review of Systems  Constitutional: Negative for fever and chills.  Eyes: Negative for blurred vision and double vision.  Respiratory: Negative for shortness of breath.   Cardiovascular: Negative for chest pain.  Gastrointestinal: Negative for nausea, vomiting, abdominal pain, diarrhea, constipation and blood in stool.  Genitourinary: Negative for dysuria.  Skin: Negative for rash.  Neurological: Negative for sensory change, speech change and headaches.    Dilation: 1.5 Effacement (%): Thick Station: Ballotable Exam by:: [name], CNM Blood pressure 120/68, pulse 79, temperature 98 F (36.7 C), temperature source Oral,  resp. rate 18, height 5\' 7"  (1.702 m), weight 77.111 kg (170 lb), last menstrual period 03/14/2011. Maternal Exam:  Abdomen: Patient reports no abdominal tenderness. Introitus: Normal vulva. Normal vagina.    Physical Exam  Physical Exam  BP 116/65  Pulse 76  Temp(Src) 98 F (36.7 C) (Oral)  Resp 18  Ht 5\' 7"  (1.702 m)  Wt 77.111 kg (170 lb)  BMI 26.63 kg/m2  LMP 03/14/2011  GENERAL: Well-developed, well-nourished female in no acute distress.  ABDOMEN: Soft, nontender, nondistended, gravid.  EXTREMITIES: Nontender, no edema, 2+ distal pulses.  (Below exam by Midwife [name] in MAU) SPECULUM EXAM: Cervix pink with strawberry-like red spots covering entire surface, moderate amount white discharge, vagina walls and external genitalia normal CERVIX: 1.5/long/high, posterior, soft  Prenatal labs: ABO, Rh:   O+ Antibody:    Rubella:   RPR:    HBsAg:    HIV:    GBS:     Assessment/Plan: 20yo [redacted]w[redacted]d w/ h/o previous preterm delivery here in labor w/ + FFN - Admit to Antenatal - Prenatal OB labs - GBS - Start Betamethasone - Start Mg Sulf for neuro prophylaxis - Consult Dr. Marice Sanchez.    [name] 10/16/2011, 6:16 PM

## 2011-10-16 NOTE — MAU Provider Note (Signed)
Chief Complaint:  Abdominal Pain   None    HPI  [name] is  21 y.o. [id number] at [redacted]w[redacted]d presents with cramping starting at 8 am today.  She reports the cramping was mild at first, then became more painful and more frequent but has started to ease off since arrival to MAU.  She reports good fetal movement, denies LOF, vaginal bleeding, vaginal itching/burning/unusual discharge, urinary symptoms, h/a, n/v, dizziness, or fever/chills.  She denies intercourse or anything in the vagina for >48 hours.  Pregnancy Course: uncomplicated  Past Medical History: Past Medical History  Diagnosis Date  . Blood transfusion     2010 s/p SVD  . Preterm labor     first pregnancy  . Migraines     not on meds  . GERD (gastroesophageal reflux disease)   . Mental disorder   . Depression     not on meds  . Pregnancy induced hypertension 2010    required Mag Sulfate    Past Surgical History: Past Surgical History  Procedure Date  . Finger surgery     right thumb    Family History: History reviewed. No pertinent family history.  Social History: History  Substance Use Topics  . Smoking status: Never Smoker   . Smokeless tobacco: Never Used  . Alcohol Use: No     last alcohol in September    Allergies: No Known Allergies  Meds:  Prescriptions prior to admission  Medication Sig Dispense Refill  . acetaminophen (TYLENOL) 325 MG tablet Take 325 mg by mouth every 6 (six) hours as needed. As needed for pain.      . Prenatal Vit-Fe Fumarate-FA (PRENATAL MULTIVITAMIN) TABS Take 1 tablet by mouth daily.      . cephALEXin (KEFLEX) 500 MG capsule Take 1 capsule (500 mg total) by mouth 4 (four) times daily.  20 capsule  0       Physical Exam  Blood pressure 118/66, pulse 84, last menstrual period 03/14/2011. GENERAL: Well-developed, well-nourished female in no acute distress.  ABDOMEN: Soft, nontender, nondistended, gravid.  EXTREMITIES: Nontender, no edema, 2+ distal  pulses. SPECULUM EXAM: Cervix pink with strawberry-like red spots covering entire surface, moderate amount white discharge, vagina walls and external genitalia normal CERVIX: 1.5/long/high, posterior, soft  FHT:  Baseline 135 , moderate variability, accelerations present, no decelerations Contractions: q 3-5 mins   Labs: Results for orders placed during the hospital encounter of 10/16/11 (from the past 24 hour(s))  URINALYSIS, ROUTINE W REFLEX MICROSCOPIC     Status: Abnormal   Collection Time   10/16/11  1:05 PM      Component Value Range   Color, Urine YELLOW  YELLOW    APPearance CLEAR  CLEAR    Specific Gravity, Urine 1.010  1.005 - 1.030    pH 7.5  5.0 - 8.0    Glucose, UA NEGATIVE  NEGATIVE (mg/dL)   Hgb urine dipstick NEGATIVE  NEGATIVE    Bilirubin Urine NEGATIVE  NEGATIVE    Ketones, ur NEGATIVE  NEGATIVE (mg/dL)   Protein, ur NEGATIVE  NEGATIVE (mg/dL)   Urobilinogen, UA 0.2  0.0 - 1.0 (mg/dL)   Nitrite NEGATIVE  NEGATIVE    Leukocytes, UA SMALL (*) NEGATIVE   URINE MICROSCOPIC-ADD ON     Status: Normal   Collection Time   10/16/11  1:05 PM      Component Value Range   Squamous Epithelial / LPF RARE  RARE    WBC, UA 0-2  <3 (WBC/hpf)  RBC / HPF 0-2  <3 (RBC/hpf)  FETAL FIBRONECTIN     Status: Abnormal   Collection Time   10/16/11  2:07 PM      Component Value Range   Fetal Fibronectin POSITIVE (*) NEGATIVE   WET PREP, GENITAL     Status: Abnormal   Collection Time   10/16/11  2:07 PM      Component Value Range   Yeast Wet Prep HPF POC NONE SEEN  NONE SEEN    Trich, Wet Prep NONE SEEN  NONE SEEN    Clue Cells Wet Prep HPF POC NONE SEEN  NONE SEEN    WBC, Wet Prep HPF POC TOO NUMEROUS TO COUNT (*) NONE SEEN    Care assumed by [place], CNM   LEFTWICH-KIRBY, Kody Vigil 4/5/20132:12 PM

## 2011-10-16 NOTE — MAU Note (Signed)
States Went to her mom's this morning, ate a corn dog around 0810, started playing a video game, then started having pain/tightening in her abdomen. States began getting worse and coming more, so she called EMS to bring her to MAU.

## 2011-10-16 NOTE — H&P (Signed)
I was present for the exam and agree with above.  Dorathy Kinsman 10/16/2011 6:54 PM

## 2011-10-17 ENCOUNTER — Inpatient Hospital Stay (HOSPITAL_COMMUNITY): Payer: Medicaid Other

## 2011-10-17 ENCOUNTER — Encounter (HOSPITAL_COMMUNITY): Payer: Self-pay | Admitting: Obstetrics and Gynecology

## 2011-10-17 DIAGNOSIS — O479 False labor, unspecified: Secondary | ICD-10-CM

## 2011-10-17 LAB — RPR: RPR Ser Ql: NONREACTIVE

## 2011-10-17 LAB — GC/CHLAMYDIA PROBE AMP, GENITAL
Chlamydia, DNA Probe: NEGATIVE
GC Probe Amp, Genital: NEGATIVE

## 2011-10-17 MED ORDER — NIFEDIPINE ER 30 MG PO TB24
30.0000 mg | ORAL_TABLET | Freq: Two times a day (BID) | ORAL | Status: DC
  Administered 2011-10-17 – 2011-10-18 (×3): 30 mg via ORAL
  Filled 2011-10-17 (×3): qty 1

## 2011-10-17 NOTE — Progress Notes (Signed)
FACULTY PRACTICE ANTEPARTUM(COMPREHENSIVE) NOTE  [name] is a 21 y.o. G3P1102 at [redacted]w[redacted]d by LMP, best clinical estimate who is admitted for Betamethasone Tx and Mg neuroprophylaxis. She Presented to MAU complaining of uterine tightening yesterday, and was admitted after +FFN obtained. Cervix was 1.5 cm/ long closed posterior   Fetal presentation is cephalic. Length of Stay:  1  Days  Subjective: Pt has had only one contraction while on Mag Sulfate. Patient reports the fetal movement as active. Patient reports uterine contraction  activity as only one overnight. Patient reports  vaginal bleeding as none. Patient describes fluid per vagina as None.  Vitals:  Blood pressure 137/53, pulse 91, temperature 98 F (36.7 C), temperature source Oral, resp. rate 18, height 5\' 7"  (1.702 m), weight 77.111 kg (170 lb), last menstrual period 03/14/2011. Physical Examination:  General appearance - alert, well appearing, and in no distress Heart - normal rate and regular rhythm Abdomen - soft, nontender, nondistended Fundal Height:  size equals dates Cervical Exam: Not evaluated. andpresentation is cephalic. Extremities: extremities normal, atraumatic, no cyanosis or edema and Homans sign is negative, no sign of DVT with DTRs 2+ bilaterally Membranes:intact  Fetal Monitoring:  Baseline: 140 bpm, Variability: Good {> 6 bpm), Accelerations: Reactive and Decelerations: Absent  Labs:  Recent Results (from the past 24 hour(s))  URINALYSIS, ROUTINE W REFLEX MICROSCOPIC   Collection Time   10/16/11  1:05 PM      Component Value Range   Color, Urine YELLOW  YELLOW    APPearance CLEAR  CLEAR    Specific Gravity, Urine 1.010  1.005 - 1.030    pH 7.5  5.0 - 8.0    Glucose, UA NEGATIVE  NEGATIVE (mg/dL)   Hgb urine dipstick NEGATIVE  NEGATIVE    Bilirubin Urine NEGATIVE  NEGATIVE    Ketones, ur NEGATIVE  NEGATIVE (mg/dL)   Protein, ur NEGATIVE  NEGATIVE (mg/dL)   Urobilinogen, UA 0.2  0.0 - 1.0  (mg/dL)   Nitrite NEGATIVE  NEGATIVE    Leukocytes, UA SMALL (*) NEGATIVE   URINE MICROSCOPIC-ADD ON   Collection Time   10/16/11  1:05 PM      Component Value Range   Squamous Epithelial / LPF RARE  RARE    WBC, UA 0-2  <3 (WBC/hpf)   RBC / HPF 0-2  <3 (RBC/hpf)  FETAL FIBRONECTIN   Collection Time   10/16/11  2:07 PM      Component Value Range   Fetal Fibronectin POSITIVE (*) NEGATIVE   WET PREP, GENITAL   Collection Time   10/16/11  2:07 PM      Component Value Range   Yeast Wet Prep HPF POC NONE SEEN  NONE SEEN    Trich, Wet Prep NONE SEEN  NONE SEEN    Clue Cells Wet Prep HPF POC NONE SEEN  NONE SEEN    WBC, Wet Prep HPF POC TOO NUMEROUS TO COUNT (*) NONE SEEN   CBC   Collection Time   10/16/11  6:18 PM      Component Value Range   WBC 8.9  4.0 - 10.5 (K/uL)   RBC 4.08  3.87 - 5.11 (MIL/uL)   Hemoglobin 11.0 (*) 12.0 - 15.0 (g/dL)   HCT 16.1 (*) 09.6 - 46.0 (%)   MCV 81.9  78.0 - 100.0 (fL)   MCH 27.0  26.0 - 34.0 (pg)   MCHC 32.9  30.0 - 36.0 (g/dL)   RDW 04.5  40.9 - 81.1 (%)  Platelets 274  150 - 400 (K/uL)  DIFFERENTIAL   Collection Time   10/16/11  6:18 PM      Component Value Range   Neutrophils Relative 56  43 - 77 (%)   Neutro Abs 4.9  1.7 - 7.7 (K/uL)   Lymphocytes Relative 35  12 - 46 (%)   Lymphs Abs 3.1  0.7 - 4.0 (K/uL)   Monocytes Relative 8  3 - 12 (%)   Monocytes Absolute 0.7  0.1 - 1.0 (K/uL)   Eosinophils Relative 1  0 - 5 (%)   Eosinophils Absolute 0.1  0.0 - 0.7 (K/uL)   Basophils Relative 0  0 - 1 (%)   Basophils Absolute 0.0  0.0 - 0.1 (K/uL)  HEPATITIS B SURFACE ANTIGEN   Collection Time   10/16/11  8:41 PM      Component Value Range   Hepatitis B Surface Ag NEGATIVE  NEGATIVE   RUBELLA SCREEN   Collection Time   10/16/11  8:41 PM      Component Value Range   Rubella 18.2 (*)   RPR   Collection Time   10/16/11  8:41 PM      Component Value Range   RPR NON REACTIVE  NON REACTIVE     Imaging Studies:    Marland Kitchen  Medications:  Scheduled     . betamethasone acetate-betamethasone sodium phosphate  12 mg Intramuscular Q24H  . docusate sodium  100 mg Oral Daily  . magnesium  4 g Intravenous Once  . NIFEdipine  30 mg Oral BID  . prenatal multivitamin  1 tablet Oral Daily   I have reviewed the patient's current medications.  ASSESSMENT: There is no problem list on file for this patient.   PLAN: D/c Mg sulfate now after 24 hrs, place on Procardia XL 30 bid,  Obtain ObU/s today, seek PNR from [facility] V 10/17/2011,6:52 AM

## 2011-10-17 NOTE — Progress Notes (Signed)
Pt up to shower

## 2011-10-18 DIAGNOSIS — O4700 False labor before 37 completed weeks of gestation, unspecified trimester: Secondary | ICD-10-CM | POA: Diagnosis present

## 2011-10-18 LAB — URINE CULTURE: Special Requests: NORMAL

## 2011-10-18 MED ORDER — NIFEDIPINE ER 30 MG PO TB24: 30.0000 mg | ORAL_TABLET | Freq: Two times a day (BID) | ORAL | Status: DC

## 2011-10-18 MED ORDER — PROGESTERONE 200 MG VA SUPP: 200.0000 mg | Freq: Every day | VAGINAL | Status: DC

## 2011-10-18 NOTE — Discharge Instructions (Signed)
Preventing Preterm Labor Preterm labor is when a pregnant woman has contractions that cause the cervix to open, shorten, and thin before 37 weeks of pregnancy. You will have regular contractions (tightening) 2 to 3 minutes apart. This usually causes discomfort or pain. HOME CARE  Eat a healthy diet.   Take your vitamins as told by your doctor.   Drink enough fluids to keep your pee (urine) clear or pale yellow every day.   Get rest and sleep.   Do not have sex if you are at high risk for preterm labor.   Follow your doctor's advice about activity, medicines, and tests.   Avoid stress.   Avoid hard labor or exercise that lasts for a long time.   Do not smoke.  GET HELP RIGHT AWAY IF:   You are having contractions.   You have belly (abdominal) pain.   You have bleeding from your vagina.   You have pain when you pee (urinate).   You have abnormal discharge from your vagina.   You have a temperature by mouth above 102 F (38.9 C).  MAKE SURE YOU:  Understand these instructions.   Will watch your condition.   Will get help if you are not doing well or get worse.  Document Released: 09/25/2008 Document Revised: 06/18/2011 Document Reviewed: 09/25/2008 Exit[employer] Patient Information 2012 [employer].  Preterm Labor Preterm labor is when labor starts at less than 37 weeks of pregnancy. The normal length of a pregnancy is 39 to 41 weeks. CAUSES Often, there is no identifiable underlying cause as to why a woman goes into preterm labor. However, one of the most common known causes of preterm labor is infection. Infections of the uterus, cervix, vagina, amniotic sac, bladder, kidney, or even the lungs (pneumonia) can cause labor to start. Other causes of preterm labor include:  Urogenital infections, such as yeast infections and bacterial vaginosis.   Uterine abnormalities (uterine shape, uterine septum, fibroids, bleeding from the placenta).   A cervix that has been  operated on and opens prematurely.   Malformations in the baby.   Multiple gestations (twins, triplets, and so on).   Breakage of the amniotic sac.  Additional risk factors for preterm labor include:  Previous history of preterm labor.   Premature rupture of membranes (PROM).   A placenta that covers the opening of the cervix (placenta previa).   A placenta that separates from the uterus (placenta abruption).   A cervix that is too weak to hold the baby in the uterus (incompetence cervix).   Having too much fluid in the amniotic sac (polyhydramnios).   Taking illegal drugs or smoking while pregnant.   Not gaining enough weight while pregnant.   Women younger than 18 and older than 20 years old.   Low socioeconomic status.   African-American ethnicity.  SYMPTOMS Signs and symptoms of preterm labor include:  Menstrual-like cramps.   Contractions that are 30 to 70 seconds apart, become very regular, closer together, and are more intense and painful.   Contractions that start on the top of the uterus and spread down to the lower abdomen and back.   A sense of increased pelvic pressure or back pain.   A watery or bloody discharge that comes from the vagina.  DIAGNOSIS  A diagnosis can be confirmed by:  A vaginal exam.   An ultrasound of the cervix.   Sampling (swabbing) cervico-vaginal secretions. These samples can be tested for the presence of fetal fibronectin. This is a   protein found in cervical discharge which is associated with preterm labor.   Fetal monitoring.  TREATMENT  Depending on the length of the pregnancy and other circumstances, a caregiver may suggest bed rest. If necessary, there are medicines that can be given to stop contractions and to quicken fetal lung maturity. If labor happens before 34 weeks of pregnancy, a prolonged hospital stay may be recommended. Treatment depends on the condition of both the mother and baby. PREVENTION There are some  things a mother can do to lower the risk of preterm labor in future pregnancies. A woman can:   Stop smoking.   Maintain healthy weight gain and avoid chemicals and drugs that are not necessary.   Be watchful for any type of infection.   Inform her caregiver if she has a known history of preterm labor.  Document Released: 09/19/2003 Document Revised: 06/18/2011 Document Reviewed: 10/24/2010 ExitCare Patient Information 2012 [employer]. 

## 2011-10-18 NOTE — Discharge Summary (Signed)
Antenatal Physician Discharge Summary  Patient ID: [name] MRN: [id number] DOB/AGE: July 19, 1990 20 y.o.  Admit date: 10/16/2011 Discharge date: 10/18/2011  Admission Diagnoses: Threatened preterm labor  Discharge Diagnoses:  The same  Prenatal Procedures: NST and ultrasound  Consults: None  Significant Diagnostic Studies: Results for orders placed during the hospital encounter of 10/16/11 (from the past 72 hour(s))  CULTURE, BETA STREP (GROUP B ONLY)     Status: Normal      Component Value Range Comment   Organism ID, Bacteria Pending     URINALYSIS, ROUTINE W REFLEX MICROSCOPIC     Status: Abnormal   Collection Time   10/16/11  1:05 PM      Component Value Range Comment   Color, Urine YELLOW  YELLOW     APPearance CLEAR  CLEAR     Specific Gravity, Urine 1.010  1.005 - 1.030     pH 7.5  5.0 - 8.0     Glucose, UA NEGATIVE  NEGATIVE (mg/dL)    Hgb urine dipstick NEGATIVE  NEGATIVE     Bilirubin Urine NEGATIVE  NEGATIVE     Ketones, ur NEGATIVE  NEGATIVE (mg/dL)    Protein, ur NEGATIVE  NEGATIVE (mg/dL)    Urobilinogen, UA 0.2  0.0 - 1.0 (mg/dL)    Nitrite NEGATIVE  NEGATIVE     Leukocytes, UA SMALL (*) NEGATIVE    URINE MICROSCOPIC-ADD ON     Status: Normal   Collection Time   10/16/11  1:05 PM      Component Value Range Comment   Squamous Epithelial / LPF RARE  RARE     WBC, UA 0-2  <3 (WBC/hpf)    RBC / HPF 0-2  <3 (RBC/hpf)   FETAL FIBRONECTIN     Status: Abnormal   Collection Time   10/16/11  2:07 PM      Component Value Range Comment   Fetal Fibronectin POSITIVE (*) NEGATIVE    WET PREP, GENITAL     Status: Abnormal   Collection Time   10/16/11  2:07 PM      Component Value Range Comment   Yeast Wet Prep HPF POC NONE SEEN  NONE SEEN     Trich, Wet Prep NONE SEEN  NONE SEEN     Clue Cells Wet Prep HPF POC NONE SEEN  NONE SEEN     WBC, Wet Prep HPF POC TOO NUMEROUS TO COUNT (*) NONE SEEN  MODERATE BACTERIA SEEN  GC/CHLAMYDIA PROBE AMP, GENITAL     Status: Normal    Collection Time   10/16/11  2:07 PM      Component Value Range Comment   GC Probe Amp, Genital NEGATIVE  NEGATIVE     Chlamydia, DNA Probe NEGATIVE  NEGATIVE    CBC     Status: Abnormal   Collection Time   10/16/11  6:18 PM      Component Value Range Comment   WBC 8.9  4.0 - 10.5 (K/uL)    RBC 4.08  3.87 - 5.11 (MIL/uL)    Hemoglobin 11.0 (*) 12.0 - 15.0 (g/dL)    HCT 82.9 (*) 56.2 - 46.0 (%)    MCV 81.9  78.0 - 100.0 (fL)    MCH 27.0  26.0 - 34.0 (pg)    MCHC 32.9  30.0 - 36.0 (g/dL)    RDW 13.0  86.5 - 78.4 (%)    Platelets 274  150 - 400 (K/uL)   DIFFERENTIAL     Status: Normal  Collection Time   10/16/11  6:18 PM      Component Value Range Comment   Neutrophils Relative 56  43 - 77 (%)    Neutro Abs 4.9  1.7 - 7.7 (K/uL)    Lymphocytes Relative 35  12 - 46 (%)    Lymphs Abs 3.1  0.7 - 4.0 (K/uL)    Monocytes Relative 8  3 - 12 (%)    Monocytes Absolute 0.7  0.1 - 1.0 (K/uL)    Eosinophils Relative 1  0 - 5 (%)    Eosinophils Absolute 0.1  0.0 - 0.7 (K/uL)    Basophils Relative 0  0 - 1 (%)    Basophils Absolute 0.0  0.0 - 0.1 (K/uL)   HEPATITIS B SURFACE ANTIGEN     Status: Normal   Collection Time   10/16/11  8:41 PM      Component Value Range Comment   Hepatitis B Surface Ag NEGATIVE  NEGATIVE    RUBELLA SCREEN     Status: Abnormal   Collection Time   10/16/11  8:41 PM      Component Value Range Comment   Rubella 18.2 (*)    RPR     Status: Normal   Collection Time   10/16/11  8:41 PM      Component Value Range Comment   RPR NON REACTIVE  NON REACTIVE    GBS pending  10/17/11 OB U/S EFW 1813g/61%, AFI 13.74, cephalic, 2.03 cm  Treatments: IV hydration, steroids: BMZ; Magnesium sulfate -> Procardia, Prometrium  Hospital Course:  This is a [name] y.o. Z6X0960 with IUP at [redacted]w[redacted]d admitted for concern about preterm labor. She was observed,gien steroids, started on magnesium sulfate for tocolysis/neuroprotection which was transitioned to Procardia.  The fetal  heart rate monitoring remained reassuring, and she had no signs/symptoms of progressing preterm labor or other maternal-fetal concerns. She was deemed stable for discharge to home with outpatient follow up.  Discharged Condition: stable  Disposition: 01-Home or Self Care  Discharge Orders    Future Orders Please Complete By Expires   Culture, beta strep (group b only)      Comments:   This external order was created through the Results Console.     Medication List  As of 10/18/2011  7:53 AM   STOP taking these medications         cephALEXin 500 MG capsule         TAKE these medications         acetaminophen 325 MG tablet   Commonly known as: TYLENOL   Take 325 mg by mouth every 6 (six) hours as needed. As needed for pain.      NIFEdipine 30 MG 24 hr tablet   Commonly known as: PROCARDIA-XL/ADALAT CC   Take 1 tablet (30 mg total) by mouth 2 (two) times daily.      prenatal multivitamin Tabs   Take 1 tablet by mouth daily.      progesterone 200 MG Supp   Place 1 suppository (200 mg total) vaginally at bedtime.           Follow-up Information    Follow up with [facility]. (You will be called with appointment time and date.  Come to MAU if symptoms worsen)    Contact information:   [address] [place] [address]          SignedTereso Newcomer 10/18/2011, 7:53 AM

## 2011-10-18 NOTE — Progress Notes (Signed)
FACULTY PRACTICE ANTEPARTUM(COMPREHENSIVE) NOTE  [name] is a 21 y.o. Z6X0960 at [redacted]w[redacted]d by LMP, best clinical estimate who is admitted for Betamethasone Tx and Mg neuroprophylaxis  For preterm labor. She presented to MAU on 10/16/11 complaining of uterine tightening yesterday, and was admitted after +FFN obtained. Cervix was 1.5 cm/ long closed posterior   Fetal presentation is cephalic. Length of Stay:  2  Days  Subjective: Pt has had only one contraction while on Mag Sulfate. Patient reports the fetal movement as active. Patient reports uterine contraction  activity as rare Patient reports  vaginal bleeding as none. Patient describes fluid per vagina as None.  Vitals:  Blood pressure 111/52, pulse 85, temperature 98 F (36.7 C), temperature source Oral, resp. rate 16, height 5\' 7"  (1.702 m), weight 77.111 kg (170 lb), last menstrual period 03/14/2011, unknown if currently breastfeeding. Physical Examination:  General appearance - alert, well appearing, and in no distress Heart - normal rate and regular rhythm Abdomen - soft, nontender, nondistended Fundal Height:  size equals dates Cervical Exam: FT/50/0 and presentation is cephalic. Extremities: extremities normal, atraumatic, no cyanosis or edema and Homans sign is negative, no sign of DVT with DTRs 2+ bilaterally Membranes:intact  Fetal Monitoring:  Baseline: 140 bpm, Variability: Good {> 6 bpm), Accelerations: Reactive and Decelerations: Absent  Labs:  No results found for this or any previous visit (from the past 24 hour(s)).  Imaging Studies:    10/17/11 OB U/S EFW 1813g/61%, AFI 13.74, cephalic, 2.03 cm  Medications:  Scheduled    . betamethasone acetate-betamethasone sodium phosphate  12 mg Intramuscular Q24H  . docusate sodium  100 mg Oral Daily  . NIFEdipine  30 mg Oral BID  . prenatal multivitamin  1 tablet Oral Daily   I have reviewed the patient's current medications.  ASSESSMENT: Threatened Preterm  labor No cervical change since admission  PLAN: Continue  Procardia XL 30 bid, start Prometrium for shortened cervix Discharge to home today with PTL/FM precautions  Kaien Pezzullo A 10/18/2011,7:36 AM

## 2011-10-19 ENCOUNTER — Telehealth: Payer: Self-pay | Admitting: *Deleted

## 2011-10-19 ENCOUNTER — Encounter: Payer: Self-pay | Admitting: *Deleted

## 2011-10-19 DIAGNOSIS — O4700 False labor before 37 completed weeks of gestation, unspecified trimester: Secondary | ICD-10-CM

## 2011-10-19 MED ORDER — PROGESTERONE MICRONIZED 200 MG PO CAPS: ORAL_CAPSULE | ORAL | Status: DC

## 2011-10-19 NOTE — Telephone Encounter (Signed)
Called patient and left a message we are returning your call , sorry that we missed you, we will call again at another time

## 2011-10-19 NOTE — Telephone Encounter (Signed)
Pt states that she was just discharged from the hospital  And was prescribed  Medicine to put in her vagina but she cannot afford it. Would like to know what she should do.

## 2011-10-19 NOTE — Telephone Encounter (Signed)
Called pt and notified her that the Rx (progesterone)  has been changed and should now be affordable.  If not, please call back to notify us.

## 2011-10-20 ENCOUNTER — Inpatient Hospital Stay (HOSPITAL_COMMUNITY): Payer: Medicaid Other

## 2011-10-20 ENCOUNTER — Observation Stay (HOSPITAL_COMMUNITY)
Admission: EM | Admit: 2011-10-20 | Discharge: 2011-10-21 | Disposition: A | Discharge status: Home / Self Care | Payer: Medicaid Other | Attending: Obstetrics & Gynecology | Admitting: Obstetrics & Gynecology

## 2011-10-20 ENCOUNTER — Encounter (HOSPITAL_COMMUNITY): Payer: Self-pay | Admitting: *Deleted

## 2011-10-20 DIAGNOSIS — Y92009 Unspecified place in unspecified non-institutional (private) residence as the place of occurrence of the external cause: Secondary | ICD-10-CM | POA: Insufficient documentation

## 2011-10-20 DIAGNOSIS — S3991XA Unspecified injury of abdomen, initial encounter: Secondary | ICD-10-CM | POA: Diagnosis present

## 2011-10-20 DIAGNOSIS — O368190 Decreased fetal movements, unspecified trimester, not applicable or unspecified: Principal | ICD-10-CM | POA: Insufficient documentation

## 2011-10-20 DIAGNOSIS — O99891 Other specified diseases and conditions complicating pregnancy: Secondary | ICD-10-CM | POA: Insufficient documentation

## 2011-10-20 DIAGNOSIS — O4700 False labor before 37 completed weeks of gestation, unspecified trimester: Secondary | ICD-10-CM

## 2011-10-20 HISTORY — DX: Anemia, unspecified: D64.9

## 2011-10-20 HISTORY — DX: Unspecified infection of urinary tract in pregnancy, unspecified trimester: O23.40

## 2011-10-20 LAB — DIFFERENTIAL
Basophils Absolute: 0 10*3/uL (ref 0.0–0.1)
Lymphocytes Relative: 36 % (ref 12–46)
Monocytes Absolute: 0.9 10*3/uL (ref 0.1–1.0)
Neutro Abs: 4.4 10*3/uL (ref 1.7–7.7)

## 2011-10-20 LAB — URINALYSIS, ROUTINE W REFLEX MICROSCOPIC
Bilirubin Urine: NEGATIVE
Protein, ur: 30 mg/dL — AB
Urobilinogen, UA: 0.2 mg/dL (ref 0.0–1.0)

## 2011-10-20 LAB — CBC
HCT: 34.1 % — ABNORMAL LOW (ref 36.0–46.0)
RBC: 4.18 MIL/uL (ref 3.87–5.11)
RDW: 14.5 % (ref 11.5–15.5)
WBC: 8.3 10*3/uL (ref 4.0–10.5)

## 2011-10-20 LAB — COMPREHENSIVE METABOLIC PANEL
ALT: 11 U/L (ref 0–35)
AST: 13 U/L (ref 0–37)
CO2: 21 mEq/L (ref 19–32)
Chloride: 105 mEq/L (ref 96–112)
GFR calc non Af Amer: >90 mL/min (ref >90)
Sodium: 136 mEq/L (ref 135–145)
Total Bilirubin: 0.2 mg/dL — ABNORMAL LOW (ref 0.3–1.2)

## 2011-10-20 LAB — KLEIHAUER-BETKE STAIN
Fetal Cells %: 0 %
Quantitation Fetal Hemoglobin: 0 mL

## 2011-10-20 LAB — URINE MICROSCOPIC-ADD ON

## 2011-10-20 LAB — APTT: aPTT: 27 seconds (ref 24–37)

## 2011-10-20 MED ORDER — PRENATAL MULTIVITAMIN CH
1.0000 | ORAL_TABLET | Freq: Every day | ORAL | Status: DC
  Administered 2011-10-21: 1 via ORAL
  Filled 2011-10-20: qty 1

## 2011-10-20 MED ORDER — DOCUSATE SODIUM 100 MG PO CAPS
100.0000 mg | ORAL_CAPSULE | Freq: Every day | ORAL | Status: DC
Start: 2011-10-20 — End: 2011-10-21
  Administered 2011-10-20 – 2011-10-21 (×2): 100 mg via ORAL
  Filled 2011-10-20 (×2): qty 1

## 2011-10-20 MED ORDER — CALCIUM CARBONATE ANTACID 500 MG PO CHEW: 2.0000 | CHEWABLE_TABLET | ORAL | Status: DC | PRN

## 2011-10-20 MED ORDER — ZOLPIDEM TARTRATE 10 MG PO TABS
10.0000 mg | ORAL_TABLET | Freq: Every evening | ORAL | Status: DC | PRN
  Filled 2011-10-20: qty 1

## 2011-10-20 MED ORDER — HYDROCODONE-ACETAMINOPHEN 5-325 MG PO TABS
1.0000 | ORAL_TABLET | Freq: Once | ORAL | Status: AC
  Administered 2011-10-20: 1 via ORAL
  Filled 2011-10-20: qty 1

## 2011-10-20 MED ORDER — ACETAMINOPHEN 325 MG PO TABS
650.0000 mg | ORAL_TABLET | ORAL | Status: DC | PRN
  Filled 2011-10-20: qty 2

## 2011-10-20 NOTE — ED Notes (Signed)
[facility] has been called, await them at this time
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
## 2011-10-20 NOTE — ED Notes (Signed)
Pt is being seen and assessed by OB RN, she is completing a medical history at this time.
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
## 2011-10-20 NOTE — Progress Notes (Signed)
Report to [facility]  
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
## 2011-10-20 NOTE — ED Notes (Signed)
Dr. Brooke Dare and Dr. Criss Alvine in the room on pt arrival at 16:04, they were paged at 15:42
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
## 2011-10-20 NOTE — ED Notes (Signed)
[facility] is here to take pt to [facility]

## 2011-10-20 NOTE — ED Notes (Addendum)
[employer] officer is at the bedside.  Also OB RN and ED RN

## 2011-10-20 NOTE — Progress Notes (Signed)
Removed from E[facility]/toco for transport to MAU via [facility]
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
## 2011-10-21 DIAGNOSIS — S3991XA Unspecified injury of abdomen, initial encounter: Secondary | ICD-10-CM

## 2011-10-21 DIAGNOSIS — S3690XA Unspecified injury of unspecified intra-abdominal organ, initial encounter: Secondary | ICD-10-CM

## 2011-10-21 HISTORY — DX: Unspecified injury of abdomen, initial encounter: S39.91XA

## 2011-10-21 LAB — URINE CULTURE
Colony Count: NO GROWTH
Culture  Setup Time: 201304100153
Culture: NO GROWTH
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
## 2011-10-21 NOTE — Progress Notes (Signed)
UR chart review completed.  

## 2011-10-22 ENCOUNTER — Encounter: Payer: Self-pay | Admitting: Obstetrics & Gynecology

## 2011-10-22 ENCOUNTER — Encounter: Payer: Self-pay | Admitting: *Deleted

## 2011-10-22 ENCOUNTER — Ambulatory Visit (INDEPENDENT_AMBULATORY_CARE_PROVIDER_SITE_OTHER): Payer: Medicaid Other | Admitting: Obstetrics & Gynecology

## 2011-10-22 VITALS — Temp 97.7°F | Wt 166.0 lb

## 2011-10-22 DIAGNOSIS — K029 Dental caries, unspecified: Secondary | ICD-10-CM

## 2011-10-22 DIAGNOSIS — O4700 False labor before 37 completed weeks of gestation, unspecified trimester: Secondary | ICD-10-CM

## 2011-10-22 DIAGNOSIS — O0990 Supervision of high risk pregnancy, unspecified, unspecified trimester: Secondary | ICD-10-CM | POA: Insufficient documentation

## 2011-10-22 DIAGNOSIS — O09899 Supervision of other high risk pregnancies, unspecified trimester: Secondary | ICD-10-CM

## 2011-10-22 DIAGNOSIS — O99820 Streptococcus B carrier state complicating pregnancy: Secondary | ICD-10-CM | POA: Insufficient documentation

## 2011-10-22 DIAGNOSIS — O09219 Supervision of pregnancy with history of pre-term labor, unspecified trimester: Secondary | ICD-10-CM

## 2011-10-22 HISTORY — DX: Dental caries, unspecified: K02.9

## 2011-10-22 HISTORY — DX: Supervision of other high risk pregnancies, unspecified trimester: O09.899

## 2011-10-22 HISTORY — DX: Supervision of high risk pregnancy, unspecified, unspecified trimester: O09.90

## 2011-10-22 LAB — POCT URINALYSIS DIP (DEVICE)
Protein, ur: 30 mg/dL — AB
Specific Gravity, Urine: 1.025 (ref 1.005–1.030)
Urobilinogen, UA: 0.2 mg/dL (ref 0.0–1.0)
pH: 6 (ref 5.0–8.0)

## 2011-10-22 LAB — CULTURE, BETA STREP (GROUP B ONLY)

## 2011-10-22 MED ORDER — HYDROCODONE-ACETAMINOPHEN 5-500 MG PO TABS: 1.0000 | ORAL_TABLET | Freq: Four times a day (QID) | ORAL | Status: AC | PRN

## 2011-10-22 MED ORDER — NIFEDIPINE 20 MG PO CAPS: 20.0000 mg | ORAL_CAPSULE | Freq: Two times a day (BID) | ORAL | Status: DC

## 2011-10-22 NOTE — Progress Notes (Signed)
Rx Procardia 20mg  BID pending start of Prometrium

## 2011-10-22 NOTE — Progress Notes (Signed)
Large carie in left lower molar, dental pain. Rx vicodin for pain, refer to dentist for eval.

## 2011-10-22 NOTE — Progress Notes (Incomplete)
Nutrition Note:  (1st consult) Dx. Hx of PTD at 34 weeks, depression, hx PIH, current- preterm labor. Consult initiated.  Pt begin to cry and would not communicate fully with me due to severe tooth pain.  Did report good appetite before tooth pain started in past 2-3 days.   Current wt gain 31# @ [redacted]w[redacted]d gestation plots 4 # > expected.  No more information obtained. Follow up if referred.  Cy Blamer, RD

## 2011-10-22 NOTE — Progress Notes (Signed)
Pt complains of headaches and toothaches. No vaginal discharge. Pulse 92. Pt to see Child psychotherapist and nutrition today.

## 2011-10-22 NOTE — Progress Notes (Signed)
Addended by: Adam Phenix on: 10/22/2011 11:46 AM   Modules accepted: Orders
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
## 2011-10-22 NOTE — Patient Instructions (Signed)

## 2011-10-22 NOTE — Progress Notes (Signed)
Addended by: Adam Phenix on: 10/22/2011 10:46 AM   Modules accepted: Orders, SmartSet

## 2011-10-26 ENCOUNTER — Encounter: Payer: No Typology Code available for payment source | Admitting: Obstetrics & Gynecology

## 2011-10-29 ENCOUNTER — Ambulatory Visit (INDEPENDENT_AMBULATORY_CARE_PROVIDER_SITE_OTHER): Payer: Self-pay | Admitting: Obstetrics and Gynecology

## 2011-10-29 VITALS — BP 113/66 | Temp 98.0°F | Wt 164.6 lb

## 2011-10-29 DIAGNOSIS — O09219 Supervision of pregnancy with history of pre-term labor, unspecified trimester: Secondary | ICD-10-CM

## 2011-10-29 LAB — POCT URINALYSIS DIP (DEVICE)
Hgb urine dipstick: NEGATIVE
Ketones, ur: NEGATIVE mg/dL
Protein, ur: NEGATIVE mg/dL
Specific Gravity, Urine: 1.015 (ref 1.005–1.030)
Urobilinogen, UA: 0.2 mg/dL (ref 0.0–1.0)
pH: 7 (ref 5.0–8.0)

## 2011-10-29 NOTE — Progress Notes (Signed)
Pulse: 86

## 2011-10-29 NOTE — Progress Notes (Signed)
Never filled Procardia or Prometrium Rx - see SW.  Not compliant with pelvic rest - encouraged to start. Tooth pain much improved, no longer on Vicodin. RLP discussed. Occ UC. [place] 4/10: cx 2.3 cm long with mild funneling (previously 2.1 cm)
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
## 2011-11-09 ENCOUNTER — Encounter: Payer: Self-pay | Admitting: Obstetrics & Gynecology

## 2011-11-09 NOTE — Progress Notes (Signed)
UR chart review completed.  

## 2011-11-16 ENCOUNTER — Encounter: Payer: Self-pay | Admitting: Advanced Practice Midwife

## 2011-11-23 ENCOUNTER — Encounter: Payer: Self-pay | Admitting: Family Medicine

## 2011-11-24 ENCOUNTER — Telehealth: Payer: Self-pay | Admitting: *Deleted

## 2011-11-24 ENCOUNTER — Inpatient Hospital Stay (HOSPITAL_COMMUNITY): Payer: Medicaid Other | Admitting: Anesthesiology

## 2011-11-24 ENCOUNTER — Inpatient Hospital Stay (HOSPITAL_COMMUNITY)
Admission: AD | Admit: 2011-11-24 | Discharge: 2011-11-24 | DRG: 778 | Disposition: A | Discharge status: Home / Self Care | Payer: Medicaid Other | Source: Ambulatory Visit | Attending: Obstetrics & Gynecology | Admitting: Obstetrics & Gynecology

## 2011-11-24 ENCOUNTER — Encounter (HOSPITAL_COMMUNITY): Payer: Self-pay | Admitting: Anesthesiology

## 2011-11-24 ENCOUNTER — Encounter (HOSPITAL_COMMUNITY): Payer: Self-pay | Admitting: *Deleted

## 2011-11-24 DIAGNOSIS — O09899 Supervision of other high risk pregnancies, unspecified trimester: Secondary | ICD-10-CM | POA: Diagnosis present

## 2011-11-24 DIAGNOSIS — O0930 Supervision of pregnancy with insufficient antenatal care, unspecified trimester: Secondary | ICD-10-CM

## 2011-11-24 DIAGNOSIS — O4700 False labor before 37 completed weeks of gestation, unspecified trimester: Secondary | ICD-10-CM

## 2011-11-24 DIAGNOSIS — IMO0002 Reserved for concepts with insufficient information to code with codable children: Secondary | ICD-10-CM

## 2011-11-24 DIAGNOSIS — O99820 Streptococcus B carrier state complicating pregnancy: Secondary | ICD-10-CM

## 2011-11-24 LAB — CBC
Hemoglobin: 10.6 g/dL — ABNORMAL LOW (ref 12.0–15.0)
MCH: 25.7 pg — ABNORMAL LOW (ref 26.0–34.0)
MCHC: 32.5 g/dL (ref 30.0–36.0)
Platelets: 248 10*3/uL (ref 150–400)
RDW: 15.1 % (ref 11.5–15.5)

## 2011-11-24 LAB — RPR: RPR Ser Ql: NONREACTIVE

## 2011-11-24 LAB — OB RESULTS CONSOLE GBS: GBS: POSITIVE

## 2011-11-24 LAB — OB RESULTS CONSOLE HIV ANTIBODY (ROUTINE TESTING): HIV: NONREACTIVE

## 2011-11-24 MED ORDER — LIDOCAINE HCL (PF) 1 % IJ SOLN
INTRAMUSCULAR | Status: DC | PRN
  Administered 2011-11-24 (×2): 5 mL

## 2011-11-24 MED ORDER — IBUPROFEN 600 MG PO TABS: 600.0000 mg | ORAL_TABLET | Freq: Four times a day (QID) | ORAL | Status: DC | PRN

## 2011-11-24 MED ORDER — OXYTOCIN BOLUS FROM INFUSION
500.0000 mL | Freq: Once | INTRAVENOUS | Status: DC
  Filled 2011-11-24: qty 500

## 2011-11-24 MED ORDER — PENICILLIN G POTASSIUM 5000000 UNITS IJ SOLR
2.5000 10*6.[IU] | INTRAVENOUS | Status: DC
  Administered 2011-11-24: 2.5 10*6.[IU] via INTRAVENOUS
  Filled 2011-11-24 (×4): qty 2.5

## 2011-11-24 MED ORDER — EPHEDRINE 5 MG/ML INJ
10.0000 mg | INTRAVENOUS | Status: DC | PRN
  Filled 2011-11-24: qty 4

## 2011-11-24 MED ORDER — PENICILLIN G POTASSIUM 5000000 UNITS IJ SOLR
5.0000 10*6.[IU] | Freq: Once | INTRAVENOUS | Status: AC
  Administered 2011-11-24: 5 10*6.[IU] via INTRAVENOUS
  Filled 2011-11-24: qty 5

## 2011-11-24 MED ORDER — DIPHENHYDRAMINE HCL 50 MG/ML IJ SOLN: 12.5000 mg | INTRAMUSCULAR | Status: DC | PRN

## 2011-11-24 MED ORDER — ACETAMINOPHEN 325 MG PO TABS: 650.0000 mg | ORAL_TABLET | ORAL | Status: DC | PRN

## 2011-11-24 MED ORDER — LACTATED RINGERS IV SOLN: 500.0000 mL | Freq: Once | INTRAVENOUS | Status: DC

## 2011-11-24 MED ORDER — CITRIC ACID-SODIUM CITRATE 334-500 MG/5ML PO SOLN: 30.0000 mL | ORAL | Status: DC | PRN

## 2011-11-24 MED ORDER — EPHEDRINE 5 MG/ML INJ: 10.0000 mg | INTRAVENOUS | Status: DC | PRN

## 2011-11-24 MED ORDER — FLEET ENEMA 7-19 GM/118ML RE ENEM: 1.0000 | ENEMA | RECTAL | Status: DC | PRN

## 2011-11-24 MED ORDER — FENTANYL 2.5 MCG/ML BUPIVACAINE 1/10 % EPIDURAL INFUSION (WH - ANES)
14.0000 mL/h | INTRAMUSCULAR | Status: DC
  Administered 2011-11-24: 14 mL/h via EPIDURAL
  Filled 2011-11-24: qty 60

## 2011-11-24 MED ORDER — BUTORPHANOL TARTRATE 2 MG/ML IJ SOLN: 1.0000 mg | INTRAMUSCULAR | Status: DC | PRN

## 2011-11-24 MED ORDER — OXYCODONE-ACETAMINOPHEN 5-325 MG PO TABS
1.0000 | ORAL_TABLET | ORAL | Status: DC | PRN
Start: 2011-11-24 — End: 2011-11-24

## 2011-11-24 MED ORDER — HYDROXYZINE HCL 50 MG PO TABS: 50.0000 mg | ORAL_TABLET | Freq: Four times a day (QID) | ORAL | Status: DC | PRN

## 2011-11-24 MED ORDER — OXYTOCIN 20 UNITS IN LACTATED RINGERS INFUSION - SIMPLE: 125.0000 mL/h | Freq: Once | INTRAVENOUS | Status: DC

## 2011-11-24 MED ORDER — LACTATED RINGERS IV SOLN
INTRAVENOUS | Status: DC
  Administered 2011-11-24 (×4): via INTRAVENOUS

## 2011-11-24 MED ORDER — PHENYLEPHRINE 40 MCG/ML (10ML) SYRINGE FOR IV PUSH (FOR BLOOD PRESSURE SUPPORT): 80.0000 ug | PREFILLED_SYRINGE | INTRAVENOUS | Status: DC | PRN

## 2011-11-24 MED ORDER — LACTATED RINGERS IV SOLN: 500.0000 mL | INTRAVENOUS | Status: DC | PRN

## 2011-11-24 MED ORDER — ONDANSETRON HCL 4 MG/2ML IJ SOLN: 4.0000 mg | Freq: Four times a day (QID) | INTRAMUSCULAR | Status: DC | PRN

## 2011-11-24 MED ORDER — PHENYLEPHRINE 40 MCG/ML (10ML) SYRINGE FOR IV PUSH (FOR BLOOD PRESSURE SUPPORT)
80.0000 ug | PREFILLED_SYRINGE | INTRAVENOUS | Status: DC | PRN
  Filled 2011-11-24: qty 5

## 2011-11-24 MED ORDER — HYDROXYZINE HCL 50 MG/ML IM SOLN
50.0000 mg | Freq: Four times a day (QID) | INTRAMUSCULAR | Status: DC | PRN
  Filled 2011-11-24: qty 1

## 2011-11-24 NOTE — Progress Notes (Signed)
Pt ready for discharge per Philipp Deputy CNM. Pincus Badder CNM reviewed FHR strip and stated no further monitoring needed before discharge. Reviewed discharge instructions with patient, removed IV and epidural. Pt ready for discharge at 2130, At this time pt stated she did not have a ride home. Remaining time patient on unit attempts were made to find transportation home. Ultimately bus fare gotten for pt to leave via bus. Pt in good condition, denied pain, with no further questions and ambulatory upon departure at 22:40

## 2011-11-24 NOTE — Telephone Encounter (Signed)
Pt left message stating that she is having pain in abd and back. She also has pelvic pressure and feels that she cannot close her legs. Please call back. I returned pt call and she said that the pain started yesterday, it is intermittent and is stronger that yesterday. She cannot say how far apart the pains are but when asked if they were occurring more that 6 times in an hour, she responded yes. She also states that the pelvic pressure started today and also is getting stronger. I advised pt to go to MAU for evaluation of possible labor. Pt states that she has someone who can bring her to the hospital.  I also informed the pt that we will need to see her next week for prenatal visit if she has not had the baby- she has missed several appts. I asked her to call us if she is sent home from MAU in order to schedule follow up appt. Pt voiced understanding.
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
## 2011-11-24 NOTE — Anesthesia Procedure Notes (Signed)
Epidural Patient location during procedure: OB Start time: 11/24/2011 2:14 PM  Staffing Anesthesiologist: Brayton Caves R Performed by: anesthesiologist   Preanesthetic Checklist Completed: patient identified, site marked, surgical consent, pre-op evaluation, timeout performed, IV checked, risks and benefits discussed and monitors and equipment checked  Epidural Patient position: sitting Prep: site prepped and draped and DuraPrep Patient monitoring: continuous pulse ox and blood pressure Approach: midline Injection technique: LOR air and LOR saline  Needle:  Needle type: Tuohy  Needle gauge: 17 G Needle length: 9 cm Needle insertion depth: 5 cm cm Catheter type: closed end flexible Catheter size: 19 Gauge Catheter at skin depth: 10 cm Test dose: negative  Assessment Events: blood not aspirated, injection not painful, no injection resistance, negative IV test and no paresthesia  Additional Notes Patient identified.  Risk benefits discussed including failed block, incomplete pain control, headache, nerve damage, paralysis, blood pressure changes, nausea, vomiting, reactions to medication both toxic or allergic, and postpartum back pain.  Patient expressed understanding and wished to proceed.  All questions were answered.  Sterile technique used throughout procedure and epidural site dressed with sterile barrier dressing. No paresthesia or other complications noted.The patient did not experience any signs of intravascular injection such as tinnitus or metallic taste in mouth nor signs of intrathecal spread such as rapid motor block. Please see nursing notes for vital signs.

## 2011-11-24 NOTE — Progress Notes (Signed)
ALLEYNE LAC is a 21 y.o. [id number] at [redacted]w[redacted]d   Subjective: Doing well. Pain well controlled on epidural  Objective: BP 133/82  Pulse 98  Temp(Src) 98.2 F (36.8 C) (Oral)  Resp 18  Ht 5\' 7"  (1.702 m)  Wt 77.111 kg (170 lb)  BMI 26.63 kg/m2  SpO2 100%  LMP 03/14/2011      FHT:  FHR: 130s bpm, variability: moderate,  accelerations:  Present,  decelerations:  Absent UC:   none SVE:   Dilation: 4 Effacement (%): 60 Station: -2 Exam by:: Raliegh Ip RN  Labs: Lab Results  Component Value Date   WBC 5.6 11/24/2011   HGB 10.6* 11/24/2011   HCT 32.6* 11/24/2011   MCV 79.1 11/24/2011   PLT 248 11/24/2011    Assessment / Plan: 21yo [id number] [redacted]w[redacted]d w/ epidural w/o cervical change.  - continue to monitor - recheck in 2-3hrs - possible DC epidural and DC home if not progressing  Kalista Laguardia, MD 11/24/2011, 3:28 PM
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
## 2011-11-24 NOTE — Anesthesia Preprocedure Evaluation (Signed)
Anesthesia Evaluation  Patient identified by MRN, date of birth, ID band Patient awake    Reviewed: Allergy & Precautions, H&P , Patient's Chart, lab work & pertinent test results  Airway Mallampati: II TM Distance: >3 FB Neck ROM: full    Dental No notable dental hx.    Pulmonary neg pulmonary ROS,  breath sounds clear to auscultation  Pulmonary exam normal       Cardiovascular negative cardio ROS  Rhythm:regular Rate:Normal     Neuro/Psych  Headaches, negative neurological ROS  negative psych ROS   GI/Hepatic negative GI ROS, Neg liver ROS, GERD-  ,  Endo/Other  negative endocrine ROS  Renal/GU negative Renal ROS     Musculoskeletal   Abdominal   Peds  Hematology negative hematology ROS (+)   Anesthesia Other Findings Blood transfusion   2010 s/p SVD Migraines   not on meds    GERD (gastroesophageal reflux disease)     Mental disorder        Depression   not on meds Pregnancy induced hypertension 2010 required Mag Sulfate    Preterm labor   first pregnancy/current pregnancy Anemia        UTI (urinary tract infection) in pregnancy, antepartum    Reproductive/Obstetrics (+) Pregnancy                           Anesthesia Physical Anesthesia Plan  ASA: II  Anesthesia Plan: Epidural   Post-op Pain Management:    Induction:   Airway Management Planned:   Additional Equipment:   Intra-op Plan:   Post-operative Plan:   Informed Consent: I have reviewed the patients History and Physical, chart, labs and discussed the procedure including the risks, benefits and alternatives for the proposed anesthesia with the patient or authorized representative who has indicated his/her understanding and acceptance.     Plan Discussed with:   Anesthesia Plan Comments:         Anesthesia Quick Evaluation

## 2011-11-24 NOTE — Progress Notes (Signed)
[name] is a 21 y.o. Z6X0960 at [redacted]w[redacted]d   Subjective: Comfortable with epid  Objective: BP 117/68  Pulse 91  Temp(Src) 98.8 F (37.1 C) (Oral)  Resp 20  Ht 5\' 7"  (1.702 m)  Wt 77.111 kg (170 lb)  BMI 26.63 kg/m2  SpO2 100%  LMP 03/14/2011      FHT:  FHR: 145 bpm, variability: moderate,  accelerations:  Present,  decelerations:  Absent UC:   irregular, every 4-11+ minutes SVE:   Dilation: 4 Effacement (%): 60 Station: -2 Exam by:: Pincus Badder CNM  Labs: Lab Results  Component Value Date   WBC 5.6 11/24/2011   HGB 10.6* 11/24/2011   HCT 32.6* 11/24/2011   MCV 79.1 11/24/2011   PLT 248 11/24/2011    Assessment / Plan: IUP at 36.3 False labor  Discussed turning off epidural and allowing to eat/ambulate, then reeval for cx change. If no change, will d/c home as we cannot induce labor at this gestation. Pt agreeable.  Cam Hai 11/24/2011, 6:04 PM

## 2011-11-24 NOTE — Discharge Summary (Signed)
Attestation of Attending Supervision of Advanced Practitioner: Evaluation and management procedures were performed by the OB Fellow/PA/CNM/NP under my supervision and collaboration. Chart reviewed, and agree with management and plan.  Sherial Ebrahim, M.D. 11/24/2011 10:37 PM   

## 2011-11-24 NOTE — Discharge Instructions (Signed)
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
## 2011-11-25 NOTE — Anesthesia Postprocedure Evaluation (Signed)
Anesthesia Post Note  Patient: [name]  Procedure(s) Performed: * No procedures listed *  Anesthesia type: Epidural  Patient location: L and D  Post pain: Pain level controlled  Post assessment: Post-op Vital signs reviewed  Last Vitals: There were no vitals filed for this visit.  Post vital signs: Reviewed  Level of consciousness: awake  Complications: No apparent anesthesia complications. Patient not in labor.  Epidural DCd for discharge home

## 2011-11-26 ENCOUNTER — Encounter (HOSPITAL_COMMUNITY): Payer: Self-pay | Admitting: *Deleted

## 2011-11-26 ENCOUNTER — Inpatient Hospital Stay (HOSPITAL_COMMUNITY)
Admission: AD | Admit: 2011-11-26 | Discharge: 2011-11-26 | Disposition: A | Discharge status: Home / Self Care | Payer: Medicaid Other | Source: Ambulatory Visit | Attending: Obstetrics and Gynecology | Admitting: Obstetrics and Gynecology

## 2011-11-26 DIAGNOSIS — K219 Gastro-esophageal reflux disease without esophagitis: Secondary | ICD-10-CM | POA: Insufficient documentation

## 2011-11-26 DIAGNOSIS — O4700 False labor before 37 completed weeks of gestation, unspecified trimester: Secondary | ICD-10-CM

## 2011-11-26 DIAGNOSIS — O99891 Other specified diseases and conditions complicating pregnancy: Secondary | ICD-10-CM | POA: Insufficient documentation

## 2011-11-26 LAB — URINALYSIS, ROUTINE W REFLEX MICROSCOPIC
Ketones, ur: NEGATIVE mg/dL
Nitrite: NEGATIVE
Specific Gravity, Urine: 1.01 (ref 1.005–1.030)
pH: 6.5 (ref 5.0–8.0)

## 2011-11-26 LAB — URINE MICROSCOPIC-ADD ON

## 2011-11-26 MED ORDER — ACETAMINOPHEN 325 MG PO TABS
650.0000 mg | ORAL_TABLET | Freq: Once | ORAL | Status: AC
  Administered 2011-11-26: 650 mg via ORAL
  Filled 2011-11-26: qty 2

## 2011-11-26 MED ORDER — ACETAMINOPHEN 325 MG PO TABS: 650.0000 mg | ORAL_TABLET | Freq: Once | ORAL | Status: DC

## 2011-11-26 MED ORDER — CYCLOBENZAPRINE HCL 10 MG PO TABS
10.0000 mg | ORAL_TABLET | Freq: Once | ORAL | Status: AC
  Administered 2011-11-26: 10 mg via ORAL
  Filled 2011-11-26: qty 1

## 2011-11-26 MED ORDER — GI COCKTAIL ~~LOC~~
30.0000 mL | Freq: Once | ORAL | Status: AC
  Administered 2011-11-26: 30 mL via ORAL
  Filled 2011-11-26: qty 30

## 2011-11-26 MED ORDER — FAMOTIDINE 40 MG PO TABS: 40.0000 mg | ORAL_TABLET | Freq: Two times a day (BID) | ORAL | Status: DC | PRN

## 2011-11-26 MED ORDER — GI COCKTAIL ~~LOC~~: 30.0000 mL | Freq: Once | ORAL | Status: DC

## 2011-11-26 NOTE — MAU Note (Signed)
Patient was seen on 5/14 dilated to 4 cm spotting [redacted]w[redacted]d.

## 2011-11-26 NOTE — Discharge Instructions (Signed)
Follow up with your [facility] provider as previously scheduled.  Please see attached references for more information.  I have sent in a prescription to your pharmacy for Pepcid.  This will help your heart burn and works better if you take it on a regular basis before your heartburn is severe.  You may also take tums (anti-acids) as needed
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
## 2011-11-26 NOTE — MAU Note (Signed)
Was here on Tues, had started dilated, abd pain has increased and now feeling pressure.

## 2011-11-27 NOTE — MAU Provider Note (Signed)
Agree with above note.  [name] 11/27/2011 9:00 AM

## 2011-11-28 LAB — TYPE AND SCREEN
Antibody Screen: POSITIVE
DAT, IgG: NEGATIVE

## 2011-11-29 ENCOUNTER — Inpatient Hospital Stay (HOSPITAL_COMMUNITY)
Admission: AD | Admit: 2011-11-29 | Discharge: 2011-11-30 | DRG: 780 | Disposition: A | Discharge status: Home / Self Care | Payer: Medicaid Other | Source: Ambulatory Visit | Attending: Obstetrics and Gynecology | Admitting: Obstetrics and Gynecology

## 2011-11-29 ENCOUNTER — Encounter (HOSPITAL_COMMUNITY): Payer: Self-pay | Admitting: *Deleted

## 2011-11-29 DIAGNOSIS — O0990 Supervision of high risk pregnancy, unspecified, unspecified trimester: Secondary | ICD-10-CM

## 2011-11-29 DIAGNOSIS — O479 False labor, unspecified: Secondary | ICD-10-CM

## 2011-11-29 LAB — CBC
MCH: 25.7 pg — ABNORMAL LOW (ref 26.0–34.0)
MCHC: 32.2 g/dL (ref 30.0–36.0)
MCV: 79.6 fL (ref 78.0–100.0)
Platelets: 221 10*3/uL (ref 150–400)
RDW: 15.3 % (ref 11.5–15.5)

## 2011-11-29 MED ORDER — LACTATED RINGERS IV SOLN: 500.0000 mL | INTRAVENOUS | Status: DC | PRN

## 2011-11-29 MED ORDER — EPHEDRINE 5 MG/ML INJ: 10.0000 mg | INTRAVENOUS | Status: DC | PRN

## 2011-11-29 MED ORDER — DIPHENHYDRAMINE HCL 50 MG/ML IJ SOLN: 12.5000 mg | INTRAMUSCULAR | Status: DC | PRN

## 2011-11-29 MED ORDER — ACETAMINOPHEN 325 MG PO TABS
650.0000 mg | ORAL_TABLET | ORAL | Status: DC | PRN
  Administered 2011-11-30: 650 mg via ORAL
  Filled 2011-11-29: qty 2

## 2011-11-29 MED ORDER — ONDANSETRON HCL 4 MG/2ML IJ SOLN: 4.0000 mg | Freq: Four times a day (QID) | INTRAMUSCULAR | Status: DC | PRN

## 2011-11-29 MED ORDER — PHENYLEPHRINE 40 MCG/ML (10ML) SYRINGE FOR IV PUSH (FOR BLOOD PRESSURE SUPPORT): 80.0000 ug | PREFILLED_SYRINGE | INTRAVENOUS | Status: DC | PRN

## 2011-11-29 MED ORDER — FENTANYL 2.5 MCG/ML BUPIVACAINE 1/10 % EPIDURAL INFUSION (WH - ANES): 14.0000 mL/h | INTRAMUSCULAR | Status: DC

## 2011-11-29 MED ORDER — FLEET ENEMA 7-19 GM/118ML RE ENEM: 1.0000 | ENEMA | RECTAL | Status: DC | PRN

## 2011-11-29 MED ORDER — OXYTOCIN BOLUS FROM INFUSION
500.0000 mL | Freq: Once | INTRAVENOUS | Status: DC
  Filled 2011-11-29: qty 500

## 2011-11-29 MED ORDER — LACTATED RINGERS IV SOLN
INTRAVENOUS | Status: DC
  Administered 2011-11-29 – 2011-11-30 (×2): via INTRAVENOUS

## 2011-11-29 MED ORDER — PENICILLIN G POTASSIUM 5000000 UNITS IJ SOLR
5.0000 10*6.[IU] | Freq: Once | INTRAMUSCULAR | Status: AC
  Administered 2011-11-30: 5 10*6.[IU] via INTRAVENOUS
  Filled 2011-11-29: qty 5

## 2011-11-29 MED ORDER — OXYTOCIN 20 UNITS IN LACTATED RINGERS INFUSION - SIMPLE: 125.0000 mL/h | Freq: Once | INTRAVENOUS | Status: DC

## 2011-11-29 MED ORDER — CITRIC ACID-SODIUM CITRATE 334-500 MG/5ML PO SOLN: 30.0000 mL | ORAL | Status: DC | PRN

## 2011-11-29 MED ORDER — PENICILLIN G POTASSIUM 5000000 UNITS IJ SOLR
2.5000 10*6.[IU] | INTRAVENOUS | Status: DC
  Administered 2011-11-30 (×2): 2.5 10*6.[IU] via INTRAVENOUS
  Filled 2011-11-29 (×4): qty 2.5

## 2011-11-29 MED ORDER — LACTATED RINGERS IV SOLN: 500.0000 mL | Freq: Once | INTRAVENOUS | Status: DC

## 2011-11-29 MED ORDER — OXYCODONE-ACETAMINOPHEN 5-325 MG PO TABS: 1.0000 | ORAL_TABLET | ORAL | Status: DC | PRN

## 2011-11-29 MED ORDER — IBUPROFEN 600 MG PO TABS: 600.0000 mg | ORAL_TABLET | Freq: Four times a day (QID) | ORAL | Status: DC | PRN

## 2011-11-29 MED ORDER — LIDOCAINE HCL (PF) 1 % IJ SOLN: 30.0000 mL | INTRAMUSCULAR | Status: DC | PRN

## 2011-11-29 MED ORDER — NALBUPHINE SYRINGE 5 MG/0.5 ML: 10.0000 mg | INJECTION | INTRAMUSCULAR | Status: DC | PRN

## 2011-11-29 NOTE — MAU Note (Signed)
Pt G3 P2 at 37.1wks having contractions and bloody show.  Arrived via EMS.

## 2011-11-29 NOTE — MAU Note (Signed)
Pt up to walk.

## 2011-11-29 NOTE — MAU Provider Note (Signed)
  History     CSN: [id number]  Arrival date and time: 11/29/11 2039   None     Chief Complaint  Patient presents with  . Labor Eval   HPI [id number]@ 37 2 in with c/o abd pain and contractions.  OB History    Grav Para Term Preterm Abortions TAB SAB Ect Mult Living   3 2 1 1  0 0 0 0 0 2      Past Medical History  Diagnosis Date  . Blood transfusion     2010 s/p SVD  . GERD (gastroesophageal reflux disease)   . Mental disorder   . Pregnancy induced hypertension 2010    required Mag Sulfate  . Preterm labor     first pregnancy/current pregnancy  . Anemia   . UTI (urinary tract infection) in pregnancy, antepartum     09/2011 tx with abx  . Depression     not on meds    Past Surgical History  Procedure Date  . Finger surgery     right thumb in childhood    Family History  Problem Relation Age of Onset  . Asthma Mother   . Diabetes Father   . Hypertension Father   . Mental illness Brother   . Anesthesia problems Neg Hx     History  Substance Use Topics  . Smoking status: Never Smoker   . Smokeless tobacco: Never Used  . Alcohol Use: No     last alcohol in September    Allergies: No Known Allergies  Prescriptions prior to admission  Medication Sig Dispense Refill  . acetaminophen (TYLENOL) 325 MG tablet Take 650 mg by mouth every 6 (six) hours as needed. For pain.      . Prenatal Vit-Fe Fumarate-FA (PRENATAL MULTIVITAMIN) TABS Take 1 tablet by mouth every morning.       . famotidine (PEPCID) 40 MG tablet Take 1 tablet (40 mg total) by mouth 2 (two) times daily as needed for heartburn.  60 tablet  0    Review of Systems  Constitutional: Negative.   HENT: Negative.   Eyes: Negative.   Respiratory: Negative.   Cardiovascular: Negative.   Gastrointestinal: Positive for abdominal pain.  Genitourinary: Negative.   Musculoskeletal: Negative.   Skin: Negative.   Neurological: Negative.   Endo/Heme/Allergies: Negative.   Psychiatric/Behavioral:  Negative.    Physical Exam   Blood pressure 118/73, pulse 108, temperature 98.1 F (36.7 C), temperature source Oral, resp. rate 20, last menstrual period 03/14/2011, unknown if currently breastfeeding.  Physical Exam  Constitutional: She is oriented to person, place, and time. She appears well-developed and well-nourished.  HENT:  Head: Normocephalic.  Cardiovascular: Normal rate, regular rhythm, normal heart sounds and intact distal pulses.   Respiratory: Effort normal and breath sounds normal.  GI: Soft. Bowel sounds are normal.  Genitourinary: Vagina normal and uterus normal.  Musculoskeletal: Normal range of motion.  Neurological: She is alert and oriented to person, place, and time. She has normal reflexes.  Skin: Skin is warm and dry.  Psychiatric: She has a normal mood and affect. Her behavior is normal. Judgment and thought content normal.    MAU Course  Procedures  MDM No change in cervical exam. Will ambulate and re evaluate.  Assessment and Plan  Ambulate and re evaluate.  Nashayla Telleria DARLENE 11/29/2011, 9:00 PM

## 2011-11-29 NOTE — MAU Note (Signed)
D. Lawson CNM notified of pt.  

## 2011-11-29 NOTE — H&P (Signed)
CSN: [id number]  Arrival date and time: 11/29/11 2039  None  Chief Complaint   Patient presents with   .  Labor Eval    HPI  [id number]@ 37 2 in with c/o abd pain and contractions.  OB History    Grav  Para  Term  Preterm  Abortions  TAB  SAB  Ect  Mult  Living    3  2  1  1   0  0  0  0  0  2      Past Medical History   Diagnosis  Date   .  Blood transfusion      2010 s/p SVD   .  GERD (gastroesophageal reflux disease)    .  Mental disorder    .  Pregnancy induced hypertension  2010     required Mag Sulfate   .  Preterm labor      first pregnancy/current pregnancy   .  Anemia    .  UTI (urinary tract infection) in pregnancy, antepartum      09/2011 tx with abx   .  Depression      not on meds    Past Surgical History   Procedure  Date   .  Finger surgery      right thumb in childhood    Family History   Problem  Relation  Age of Onset   .  Asthma  Mother    .  Diabetes  Father    .  Hypertension  Father    .  Mental illness  Brother    .  Anesthesia problems  Neg Hx     History   Substance Use Topics   .  Smoking status:  Never Smoker   .  Smokeless tobacco:  Never Used   .  Alcohol Use:  No      last alcohol in September    Allergies: No Known Allergies  Prescriptions prior to admission   Medication  Sig  Dispense  Refill   .  acetaminophen (TYLENOL) 325 MG tablet  Take 650 mg by mouth every 6 (six) hours as needed. For pain.     .  Prenatal Vit-Fe Fumarate-FA (PRENATAL MULTIVITAMIN) TABS  Take 1 tablet by mouth every morning.     .  famotidine (PEPCID) 40 MG tablet  Take 1 tablet (40 mg total) by mouth 2 (two) times daily as needed for heartburn.  60 tablet  0    Review of Systems  Constitutional: Negative.  HENT: Negative.  Eyes: Negative.  Respiratory: Negative.  Cardiovascular: Negative.  Gastrointestinal: Positive for abdominal pain.  Genitourinary: Negative.  Musculoskeletal: Negative.  Skin: Negative.  Neurological: Negative.    Endo/Heme/Allergies: Negative.  Psychiatric/Behavioral: Negative.   Physical Exam   Blood pressure 118/73, pulse 108, temperature 98.1 F (36.7 C), temperature source Oral, resp. rate 20, last menstrual period 03/14/2011, unknown if currently breastfeeding.  Physical Exam  Constitutional: She is oriented to person, place, and time. She appears well-developed and well-nourished.  HENT:  Head: Normocephalic.  Cardiovascular: Normal rate, regular rhythm, normal heart sounds and intact distal pulses.  Respiratory: Effort normal and breath sounds normal.  GI: Soft. Bowel sounds are normal.  Genitourinary: Vagina normal and uterus normal.  Musculoskeletal: Normal range of motion.  Neurological: She is alert and oriented to person, place, and time. She has normal reflexes.  Skin: Skin is warm and dry.  Psychiatric: She has a normal mood and affect. Her behavior is normal.  Judgment and thought content normal.   MAU Course   Procedures  MDM  No change in cervical exam. Will ambulate and re evaluate.  Assessment and Plan   Ambulate and re evaluate. SVE 5/80/-1 cervical change noted will admit pt. Maliek Schellhorn DARLENE  11/29/2011, 9:00 PM

## 2011-11-29 NOTE — MAU Note (Signed)
Marlynn Perking in to see pt.

## 2011-11-30 DIAGNOSIS — O479 False labor, unspecified: Secondary | ICD-10-CM | POA: Diagnosis present

## 2011-11-30 LAB — RPR: RPR Ser Ql: NONREACTIVE

## 2011-11-30 MED ORDER — HYDROCODONE-ACETAMINOPHEN 5-500 MG PO TABS: 1.0000 | ORAL_TABLET | Freq: Four times a day (QID) | ORAL | Status: AC | PRN
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
## 2011-11-30 NOTE — Progress Notes (Signed)
[name] is a 21 y.o. [id number] at [redacted]w[redacted]d by ultrasound admitted for active labor  Subjective:   Objective: BP 130/71  Pulse 93  Temp(Src) 98.1 F (36.7 C) (Oral)  Resp 18  Ht 5\' 7"  (1.702 m)  Wt 173 lb (78.472 kg)  BMI 27.10 kg/m2  LMP 03/14/2011  Breastfeeding? Unknown      FHT:  FHR: 130's bpm, variability: moderate,  accelerations:  Present,  decelerations:  Absent UC:  Reg 2 2-3 SVE:   Dilation: 5 Effacement (%): 80 Station: -1 Exam by:: Marlynn Perking CNM  Labs: Lab Results  Component Value Date   WBC 9.1 11/29/2011   HGB 9.7* 11/29/2011   HCT 30.1* 11/29/2011   MCV 79.6 11/29/2011   PLT 221 11/29/2011    Assessment / Plan: Protracted active phase  Labor: Progressing normally Preeclampsia:  no signs or symptoms of toxicity Fetal Wellbeing:  Category I Pain Control:  Labor support without medications I/D:  n/a Anticipated MOD:  NSVD  Kristene Liberati DARLENE 11/30/2011, 1:29 AM

## 2011-11-30 NOTE — MAU Provider Note (Signed)
Agree with above note.  [name] 11/30/2011 6:12 AM   

## 2011-11-30 NOTE — H&P (Signed)
Agree with above note.  [name] 11/30/2011 6:12 AM

## 2011-11-30 NOTE — Discharge Instructions (Signed)

## 2011-11-30 NOTE — Progress Notes (Signed)
1050  Discharge instructions given and pt verbalized understanding.  Pt waiting on ride to come for discharge.  Instructed to notify staff when her ride arrives and she is leaving.  Pt expressed understanding.

## 2011-11-30 NOTE — Progress Notes (Signed)
Patient ID: [name], female   DOB: 1991/04/25, 21 y.o.   MRN: [id number] Contractions have spaced out during course of night. FHR pattern remains reassuring with accels noted no decels. Will discuss POC with attending.

## 2011-12-03 ENCOUNTER — Encounter: Payer: Self-pay | Admitting: Obstetrics and Gynecology

## 2011-12-03 ENCOUNTER — Encounter: Payer: Self-pay | Admitting: Obstetrics & Gynecology

## 2011-12-11 ENCOUNTER — Encounter (HOSPITAL_COMMUNITY): Payer: Self-pay | Admitting: *Deleted

## 2011-12-11 ENCOUNTER — Inpatient Hospital Stay (HOSPITAL_COMMUNITY)
Admission: AD | Admit: 2011-12-11 | Discharge: 2011-12-12 | DRG: 775 | Disposition: A | Discharge status: Home / Self Care | Payer: Medicaid Other | Source: Ambulatory Visit | Attending: Obstetrics & Gynecology | Admitting: Obstetrics & Gynecology

## 2011-12-11 DIAGNOSIS — O479 False labor, unspecified: Secondary | ICD-10-CM

## 2011-12-11 DIAGNOSIS — O9989 Other specified diseases and conditions complicating pregnancy, childbirth and the puerperium: Secondary | ICD-10-CM

## 2011-12-11 DIAGNOSIS — O4700 False labor before 37 completed weeks of gestation, unspecified trimester: Secondary | ICD-10-CM

## 2011-12-11 DIAGNOSIS — O99892 Other specified diseases and conditions complicating childbirth: Principal | ICD-10-CM | POA: Diagnosis present

## 2011-12-11 DIAGNOSIS — Z22330 Carrier of Group B streptococcus: Secondary | ICD-10-CM

## 2011-12-11 DIAGNOSIS — O99820 Streptococcus B carrier state complicating pregnancy: Secondary | ICD-10-CM

## 2011-12-11 DIAGNOSIS — IMO0002 Reserved for concepts with insufficient information to code with codable children: Secondary | ICD-10-CM

## 2011-12-11 DIAGNOSIS — O0930 Supervision of pregnancy with insufficient antenatal care, unspecified trimester: Secondary | ICD-10-CM

## 2011-12-11 LAB — CBC
MCH: 24.6 pg — ABNORMAL LOW (ref 26.0–34.0)
MCV: 78.9 fL (ref 78.0–100.0)
Platelets: 302 10*3/uL (ref 150–400)
RDW: 15.4 % (ref 11.5–15.5)

## 2011-12-11 MED ORDER — CITRIC ACID-SODIUM CITRATE 334-500 MG/5ML PO SOLN: 30.0000 mL | ORAL | Status: DC | PRN

## 2011-12-11 MED ORDER — LANOLIN HYDROUS EX OINT: TOPICAL_OINTMENT | CUTANEOUS | Status: DC | PRN

## 2011-12-11 MED ORDER — LACTATED RINGERS IV SOLN: 500.0000 mL | INTRAVENOUS | Status: DC | PRN

## 2011-12-11 MED ORDER — ONDANSETRON HCL 4 MG/2ML IJ SOLN: 4.0000 mg | Freq: Four times a day (QID) | INTRAMUSCULAR | Status: DC | PRN

## 2011-12-11 MED ORDER — OXYTOCIN 10 UNIT/ML IJ SOLN: 10.0000 [IU] | Freq: Once | INTRAMUSCULAR | Status: DC

## 2011-12-11 MED ORDER — LACTATED RINGERS IV SOLN: INTRAVENOUS | Status: DC

## 2011-12-11 MED ORDER — SIMETHICONE 80 MG PO CHEW: 80.0000 mg | CHEWABLE_TABLET | ORAL | Status: DC | PRN

## 2011-12-11 MED ORDER — OXYCODONE-ACETAMINOPHEN 5-325 MG PO TABS
1.0000 | ORAL_TABLET | ORAL | Status: DC | PRN
  Administered 2011-12-11 – 2011-12-12 (×2): 1 via ORAL
  Filled 2011-12-11 (×2): qty 1

## 2011-12-11 MED ORDER — IBUPROFEN 600 MG PO TABS: 600.0000 mg | ORAL_TABLET | Freq: Four times a day (QID) | ORAL | Status: DC | PRN

## 2011-12-11 MED ORDER — IBUPROFEN 600 MG PO TABS
600.0000 mg | ORAL_TABLET | Freq: Four times a day (QID) | ORAL | Status: DC | PRN
  Administered 2011-12-11: 600 mg via ORAL
  Filled 2011-12-11: qty 1

## 2011-12-11 MED ORDER — BENZOCAINE-MENTHOL 20-0.5 % EX AERO
1.0000 "application " | INHALATION_SPRAY | CUTANEOUS | Status: DC | PRN
  Filled 2011-12-11 (×2): qty 56

## 2011-12-11 MED ORDER — ACETAMINOPHEN 325 MG PO TABS: 650.0000 mg | ORAL_TABLET | ORAL | Status: DC | PRN

## 2011-12-11 MED ORDER — OXYCODONE-ACETAMINOPHEN 5-325 MG PO TABS: 1.0000 | ORAL_TABLET | ORAL | Status: DC | PRN

## 2011-12-11 MED ORDER — OXYTOCIN 20 UNITS IN LACTATED RINGERS INFUSION - SIMPLE: 125.0000 mL/h | Freq: Once | INTRAVENOUS | Status: DC

## 2011-12-11 MED ORDER — PRENATAL MULTIVITAMIN CH
1.0000 | ORAL_TABLET | Freq: Every day | ORAL | Status: DC
  Administered 2011-12-11 – 2011-12-12 (×2): 1 via ORAL
  Filled 2011-12-11 (×2): qty 1

## 2011-12-11 MED ORDER — WITCH HAZEL-GLYCERIN EX PADS: 1.0000 "application " | MEDICATED_PAD | CUTANEOUS | Status: DC | PRN

## 2011-12-11 MED ORDER — ONDANSETRON HCL 4 MG/2ML IJ SOLN: 4.0000 mg | INTRAMUSCULAR | Status: DC | PRN

## 2011-12-11 MED ORDER — OXYTOCIN BOLUS FROM INFUSION
500.0000 mL | Freq: Once | INTRAVENOUS | Status: DC
  Filled 2011-12-11: qty 500

## 2011-12-11 MED ORDER — LIDOCAINE HCL (PF) 1 % IJ SOLN: 30.0000 mL | INTRAMUSCULAR | Status: DC | PRN

## 2011-12-11 MED ORDER — IBUPROFEN 600 MG PO TABS
600.0000 mg | ORAL_TABLET | Freq: Four times a day (QID) | ORAL | Status: DC
  Administered 2011-12-11 – 2011-12-12 (×5): 600 mg via ORAL
  Filled 2011-12-11 (×5): qty 1

## 2011-12-11 MED ORDER — FLEET ENEMA 7-19 GM/118ML RE ENEM: 1.0000 | ENEMA | RECTAL | Status: DC | PRN

## 2011-12-11 MED ORDER — ZOLPIDEM TARTRATE 5 MG PO TABS: 5.0000 mg | ORAL_TABLET | Freq: Every evening | ORAL | Status: DC | PRN

## 2011-12-11 MED ORDER — DIPHENHYDRAMINE HCL 25 MG PO CAPS: 25.0000 mg | ORAL_CAPSULE | Freq: Four times a day (QID) | ORAL | Status: DC | PRN

## 2011-12-11 MED ORDER — DIBUCAINE 1 % RE OINT
1.0000 "application " | TOPICAL_OINTMENT | RECTAL | Status: DC | PRN
  Filled 2011-12-11: qty 28

## 2011-12-11 MED ORDER — ONDANSETRON HCL 4 MG PO TABS: 4.0000 mg | ORAL_TABLET | ORAL | Status: DC | PRN

## 2011-12-11 MED ORDER — TETANUS-DIPHTH-ACELL PERTUSSIS 5-2.5-18.5 LF-MCG/0.5 IM SUSP
0.5000 mL | Freq: Once | INTRAMUSCULAR | Status: AC
Start: 2011-12-12 — End: 2011-12-12
  Administered 2011-12-12: 0.5 mL via INTRAMUSCULAR
  Filled 2011-12-11: qty 0.5

## 2011-12-11 MED ORDER — FENTANYL CITRATE 0.05 MG/ML IJ SOLN: 100.0000 ug | INTRAMUSCULAR | Status: AC

## 2011-12-11 MED ORDER — OXYCODONE-ACETAMINOPHEN 5-325 MG PO TABS
1.0000 | ORAL_TABLET | ORAL | Status: DC | PRN
  Administered 2011-12-11: 2 via ORAL
  Filled 2011-12-11: qty 2

## 2011-12-11 MED ORDER — SENNOSIDES-DOCUSATE SODIUM 8.6-50 MG PO TABS
2.0000 | ORAL_TABLET | Freq: Every day | ORAL | Status: DC
  Administered 2011-12-11: 2 via ORAL

## 2011-12-11 NOTE — MAU Note (Signed)
PT ARRIVED VIA EMS - HOLL[place].  ON MONITOR- VE , IV

## 2011-12-11 NOTE — Progress Notes (Signed)
UR chart review completed.  

## 2011-12-11 NOTE — H&P (Signed)
Agree with above note.  Ysmael Hires H. 12/11/2011 8:00 AM  

## 2011-12-11 NOTE — H&P (Signed)
[name] is a 21 y.o. female presenting for contractions at term.  She reports onset of painful contractions 1 hour ago and is breathing with contractions and crying upon arrival to the MAU via EMS.  She reports good fetal movement, denies LOF, vaginal bleeding, vaginal itching/burning, urinary symptoms, h/a, dizziness, n/v, or fever/chills.    Maternal Medical History:  Reason for admission: Reason for admission: contractions.  Reason for Admission:   nauseaContractions: Onset was less than 1 hour ago.   Frequency: regular.   Duration is approximately 1 minute.   Perceived severity is strong.    Fetal activity: Perceived fetal activity is normal.   Last perceived fetal movement was within the past hour.    Prenatal complications: Preterm labor.   Prenatal Complications - Diabetes: none.    OB History    Grav Para Term Preterm Abortions TAB SAB Ect Mult Living   3 3 2 1  0 0 0 0 0 3     Past Medical History  Diagnosis Date  . Blood transfusion     2010 s/p SVD  . GERD (gastroesophageal reflux disease)   . Mental disorder   . Pregnancy induced hypertension 2010    required Mag Sulfate  . Preterm labor     first pregnancy/current pregnancy  . Anemia   . UTI (urinary tract infection) in pregnancy, antepartum     09/2011 tx with abx  . Depression     not on meds   Past Surgical History  Procedure Date  . Finger surgery     right thumb in childhood   Family History: family history includes Asthma in her mother; Diabetes in her father; Hypertension in her father; and Mental illness in her brother.  There is no history of Anesthesia problems. Social History:  reports that she has never smoked. She has never used smokeless tobacco. She reports that she does not drink alcohol or use illicit drugs.  Review of Systems  Constitutional: Negative for fever, chills and malaise/fatigue.  Eyes: Negative for blurred vision.  Respiratory: Negative for cough and shortness of  breath.   Cardiovascular: Negative for chest pain.  Gastrointestinal: Negative for heartburn, nausea and vomiting.  Genitourinary: Negative for dysuria, urgency and frequency.  Musculoskeletal: Negative.   Neurological: Negative for dizziness and headaches.  Psychiatric/Behavioral: Negative for depression.    Dilation: 7 Exam by:: T. STALLING, RN Blood pressure 127/86, pulse 84, resp. rate 16, height 5\' 7"  (1.702 m), last menstrual period 03/14/2011, unknown if currently breastfeeding. Maternal Exam:  Uterine Assessment: Contraction strength is firm.  Contraction duration is 1 minute. Contraction frequency is regular.   Abdomen: Patient reports no abdominal tenderness. Fetal presentation: vertex  Pelvis: adequate for delivery.   Cervix: Cervix evaluated by digital exam.     Fetal Exam Fetal Monitor Review: Mode: ultrasound.   Baseline rate: 135.  Variability: moderate (6-25 bpm).   Pattern: no accelerations and no decelerations.    Fetal State Assessment: Category I - tracings are normal.     Physical Exam  Nursing note and vitals reviewed. Constitutional: She is oriented to person, place, and time. She appears well-developed and well-nourished.  Neck: Normal range of motion.  Cardiovascular: Normal rate.   Respiratory: Effort normal.  GI: Soft.  Musculoskeletal: Normal range of motion.  Neurological: She is alert and oriented to person, place, and time.  Skin: Skin is warm and dry.  Psychiatric: She has a normal mood and affect. Her behavior is normal. Judgment and thought  content normal.   Dilation: 7 Presentation: Vertex Exam by:: Cline Crock, RN  Prenatal labs: ABO, Rh: --/--/O POS (05/14 1442) Antibody: POS (05/14 1442) Rubella: Immune (05/14 0000) RPR: NON REACTIVE (05/19 2300)  HBsAg: NEGATIVE (04/05 2041)  HIV: Non-reactive (05/14 0000)  GBS: Positive (05/14 0000)   Assessment/Plan: Active labor GBS positive  Admit to labor and delivery PCN for GBS  prophylaxis Anticipate NSVD   LEFTWICH-KIRBY, Aerion Bagdasarian 12/11/2011, 7:30 AM
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
## 2011-12-12 LAB — CBC
MCHC: 31.8 g/dL (ref 30.0–36.0)
Platelets: 271 10*3/uL (ref 150–400)
RDW: 15.4 % (ref 11.5–15.5)
WBC: 9.5 10*3/uL (ref 4.0–10.5)

## 2011-12-12 LAB — RAPID URINE DRUG SCREEN, HOSP PERFORMED
Benzodiazepines: NOT DETECTED
Cocaine: NOT DETECTED

## 2011-12-12 MED ORDER — IBUPROFEN 600 MG PO TABS: 600.0000 mg | ORAL_TABLET | Freq: Four times a day (QID) | ORAL | Status: AC

## 2011-12-12 NOTE — Discharge Instructions (Signed)

## 2011-12-12 NOTE — Progress Notes (Signed)
Post Partum Day 1 Subjective: no complaints  Objective: Blood pressure 115/74, pulse 71, temperature 98 F (36.7 C), temperature source Oral, resp. rate 16, height 5\' 7"  (1.702 m), last menstrual period 03/14/2011, SpO2 99.00%, unknown if currently breastfeeding.  Physical Exam:  General: alert, cooperative and appears stated age Lochia: appropriate Uterine Fundus: firm DVT Evaluation: No evidence of DVT seen on physical exam.   Basename 12/12/11 0525 12/11/11 0525  HGB 9.2* 10.7*  HCT 28.9* 34.3*    Assessment/Plan: Discharge home, Breastfeeding and Contraception Depo   LOS: 1 day   [name] S 12/12/2011, 9:22 AM

## 2011-12-12 NOTE — Discharge Summary (Signed)
Obstetric Discharge Summary Reason for Admission: onset of labor Prenatal Procedures: none Intrapartum Procedures: spontaneous vaginal delivery Postpartum Procedures: none Complications-Operative and Postpartum: none Hemoglobin  Date Value Range Status  12/12/2011 9.2* 12.0-15.0 (g/dL) Final     HCT  Date Value Range Status  12/12/2011 28.9* 36.0-46.0 (%) Final    Physical Exam:  General: alert, cooperative and appears stated age 21: appropriate Uterine Fundus: firm DVT Evaluation: No evidence of DVT seen on physical exam.  Discharge Diagnoses: Term Pregnancy-delivered  Discharge Information: Date: 12/12/2011 Activity: pelvic rest Diet: routine Medications: PNV and Ibuprofen Condition: stable Instructions: refer to practice specific booklet Discharge to: home Follow-up Information    Follow up with [facility] in 4 weeks. (pp check--return next week for DepoProvera, they will call you)    Contact information:   [address] [place], [place]  [address] [phone]         Newborn Data: Live born female  Birth Weight: 7 lb 10.2 oz (3464 g) APGAR: 8, 9  Home with mother.  Rein Popov S 12/12/2011, 9:29 AM

## 2011-12-18 ENCOUNTER — Ambulatory Visit: Payer: Self-pay

## 2012-01-13 ENCOUNTER — Ambulatory Visit: Payer: Self-pay | Admitting: Physician Assistant

## 2012-03-16 ENCOUNTER — Ambulatory Visit: Payer: Medicaid Other | Admitting: Advanced Practice Midwife

## 2012-04-06 ENCOUNTER — Ambulatory Visit: Payer: Medicaid Other | Admitting: Advanced Practice Midwife

## 2012-06-15 ENCOUNTER — Other Ambulatory Visit (HOSPITAL_COMMUNITY)
Admission: RE | Admit: 2012-06-15 | Discharge: 2012-06-15 | Disposition: A | Discharge status: Home / Self Care | Payer: Medicaid Other | Source: Ambulatory Visit | Attending: Obstetrics and Gynecology | Admitting: Obstetrics and Gynecology

## 2012-06-15 ENCOUNTER — Other Ambulatory Visit: Payer: Self-pay | Admitting: Obstetrics and Gynecology

## 2012-06-15 ENCOUNTER — Ambulatory Visit (INDEPENDENT_AMBULATORY_CARE_PROVIDER_SITE_OTHER): Payer: Medicaid Other | Admitting: Obstetrics and Gynecology

## 2012-06-15 ENCOUNTER — Encounter: Payer: Self-pay | Admitting: Obstetrics and Gynecology

## 2012-06-15 VITALS — BP 121/71 | HR 70 | Temp 98.4°F | Resp 20 | Ht 67.0 in | Wt 153.6 lb

## 2012-06-15 DIAGNOSIS — Z3049 Encounter for surveillance of other contraceptives: Secondary | ICD-10-CM

## 2012-06-15 DIAGNOSIS — R102 Pelvic and perineal pain: Secondary | ICD-10-CM | POA: Insufficient documentation

## 2012-06-15 DIAGNOSIS — Z113 Encounter for screening for infections with a predominantly sexual mode of transmission: Secondary | ICD-10-CM | POA: Insufficient documentation

## 2012-06-15 DIAGNOSIS — Z3042 Encounter for surveillance of injectable contraceptive: Secondary | ICD-10-CM | POA: Insufficient documentation

## 2012-06-15 DIAGNOSIS — N949 Unspecified condition associated with female genital organs and menstrual cycle: Secondary | ICD-10-CM

## 2012-06-15 HISTORY — DX: Pelvic and perineal pain: R10.2

## 2012-06-15 HISTORY — DX: Encounter for surveillance of injectable contraceptive: Z30.42

## 2012-06-15 MED ORDER — INFLUENZA VIRUS VACC SPLIT PF IM SUSP: 0.5000 mL | Freq: Once | INTRAMUSCULAR | Status: DC

## 2012-06-15 MED ORDER — IBUPROFEN 800 MG PO TABS: 800.0000 mg | ORAL_TABLET | Freq: Three times a day (TID) | ORAL | Status: DC | PRN

## 2012-06-15 MED ORDER — MEDROXYPROGESTERONE ACETATE 150 MG/ML IM SUSP
150.0000 mg | INTRAMUSCULAR | Status: DC
  Administered 2012-06-15: 150 mg via INTRAMUSCULAR

## 2012-06-15 NOTE — Progress Notes (Signed)
CC: Contraception and Pelvic Pain   HPI [name] is a 21 y.o. [phone] who presents with onset over a week ago of sharp right suprapubic pelvic pain. She describes it as being continuous but waxing and waning and feeling knifelike in nature she notices it most when she is at rest it is fleeting and does not radiate. She denies dysuria, hematuria, frequency or urgency of urination. She's never had a similar episode. She denies nausea vomiting diarrhea. Menses are regular LMP 06/12/2012. Denies abnormal bleeding. She desires to start on Depo-Provera for contraception. She has been on in the past and was satisfied. She was amenorrheic on Depo.  Past Medical History  Diagnosis Date  . Blood transfusion     2010 s/p SVD  . GERD (gastroesophageal reflux disease)   . Mental disorder   . Preterm labor     first pregnancy/current pregnancy  . Anemia   . UTI (urinary tract infection) in pregnancy, antepartum     09/2011 tx with abx  . Depression     not on meds  . Pregnancy induced hypertension 2010    required Mag Sulfate    OB History    Grav Para Term Preterm Abortions TAB SAB Ect Mult Living   3 3 2 1  0 0 0 0 0 3     # Outc Date GA Lbr Len/2nd Wgt Sex Del Anes PTL Lv   1 TRM 4/10 [redacted]w[redacted]d   F SVD   Yes   2 PRE 9/11 [redacted]w[redacted]d   M SVD   Yes   Comments: PPROM at 55 wk,+GBS, IOL at 34 wk   3 TRM 5/13 [redacted]w[redacted]d 273:55 / 00:09 7lb10.2oz(3.465kg) M SVD None  Yes      Past Surgical History  Procedure Date  . Finger surgery     right thumb in childhood    History   Social History  . Marital Status: Single    Spouse Name: N/A    Number of Children: N/A  . Years of Education: N/A   Occupational History  . Not on file.   Social History Main Topics  . Smoking status: Never Smoker   . Smokeless tobacco: Never Used  . Alcohol Use: No     Comment: last alcohol in September  . Drug Use: No  . Sexually Active: Yes    Birth Control/ Protection: None   Other Topics Concern  . Not  on file   Social History Narrative  . No narrative on file    Current Outpatient Prescriptions on File Prior to Visit  Medication Sig Dispense Refill  . acetaminophen (TYLENOL) 325 MG tablet Take 650 mg by mouth every 6 (six) hours as needed. For pain.      . Prenatal Vit-Fe Fumarate-FA (PRENATAL MULTIVITAMIN) TABS Take 1 tablet by mouth every morning.       . famotidine (PEPCID) 40 MG tablet Take 1 tablet (40 mg total) by mouth 2 (two) times daily as needed for heartburn.  60 tablet  0    No Known Allergies  ROS Pertinent items in HPI  PHYSICAL EXAM Filed Vitals:   06/15/12 1336  BP: 121/71  Pulse: 70  Temp: 98.4 F (36.9 C)  Resp: 20   General: Well nourished, well developed female in no acute distress Cardiovascular: Normal rate Respiratory: Normal effort Abdomen: Soft, flat, ND, no guarding or rebound. NT at McBurney's point, mild-mod tenderness mid to right suprapubic Back: No CVAT Extremities: No edema Neurologic: Alert and oriented  Speculum exam: NEFG; vagina with physiologic discharge, scant blood; cervix clean Bimanual exam: cervix closed, minimal CMT; uterus normal size, mobile, sl tender; marked right adnexal tenderness with voluntary guarding, ? Sl thickening, no defined adnexal masses  LAB RESULTS No results found for this or any previous visit (from the past 24 hour(s)).  IMAGING No results found.  ASSESSMENT  1. Encounter for Depo-Provera contraception   2. Pelvic pain in female     PLAN GC/CT sent. Scheduled pelvic US for right adnexal pain Rx ibuprofen  If neg UPT will administer Depoprovera See AVS for patient education. F/U 2 weeks [place] results      Med List    Warning       Cannot display discharge medications because this is not an admission.           Danae Orleans, CNM 06/15/2012 2:26 PM

## 2012-06-15 NOTE — Patient Instructions (Addendum)
Pelvic Pain Pelvic pain is pain below the belly button and located between your hips. Acute pain may last a few hours or days. Chronic pelvic pain may last weeks and months. The cause may be different for different types of pain. The pain may be dull or sharp, mild or severe and can interfere with your daily activities. Write down and tell your caregiver:   Exactly where the pain is located.  If it comes and goes or is there all the time.  When it happens (with sex, urination, bowel movement, etc.)  If the pain is related to your menstrual period or stress. Your caregiver will take a full history and do a complete physical exam and Pap test. CAUSES   Painful menstrual periods (dysmenorrhea).  Normal ovulation (Mittelschmertz) that occurs in the middle of the menstrual cycle every month.  The pelvic organs get engorged with blood just before the menstrual period (pelvic congestive syndrome).  Scar tissue from an infection or past surgery (pelvic adhesions).  Cancer of the female pelvic organs. When there is pain with cancer, it has been there for a long time.  The lining of the uterus (endometrium) abnormally grows in places like the pelvis and on the pelvic organs (endometriosis).  A form of endometriosis with the lining of the uterus present inside of the muscle tissue of the uterus (adenomyosis).  Fibroid tumor (noncancerous) in the uterus.  Bladder problems such as infection, bladder spasms of the muscle tissue of the bladder.  Intestinal problems (irritable bowel syndrome, colitis, an ulcer or gastrointestinal infection).  Polyps of the cervix or uterus.  Pregnancy in the tube (ectopic pregnancy).  The opening of the cervix is too small for the menstrual blood to flow through it (cervical stenosis).  Physical or sexual abuse (past or present).  Musculo-skeletal problems from poor posture, problems with the vertebrae of the lower back or the uterine pelvic muscles falling  (prolapse).  Psychological problems such as depression or stress.  IUD (intrauterine device) in the uterus. DIAGNOSIS  Tests to make a diagnosis depends on the type, location, severity and what causes the pain to occur. Tests that may be needed include:  Blood tests.  Urine tests  Ultrasound.  X-rays.  CT Scan.  MRI.  Laparoscopy.  Major surgery. TREATMENT  Treatment will depend on the cause of the pain, which includes:  Prescription or over-the-counter pain medication.  Antibiotics.  Birth control pills.  Hormone treatment.  Nerve blocking injections.  Physical therapy.  Antidepressants.  Counseling with a psychiatrist or psychologist.  Minor or major surgery. HOME CARE INSTRUCTIONS   Only take over-the-counter or prescription medicines for pain, discomfort or fever as directed by your caregiver.  Follow your caregiver's advice to treat your pain.  Rest.  Avoid sexual intercourse if it causes the pain.  Apply warm or cold compresses (which ever works best) to the pain area.  Do relaxation exercises such as yoga or meditation.  Try acupuncture.  Avoid stressful situations.  Try group therapy.  If the pain is because of a stomach/intestinal upset, drink clear liquids, eat a bland light food diet until the symptoms go away. SEEK MEDICAL CARE IF:   You need stronger prescription pain medication.  You develop pain with sexual intercourse.  You have pain with urination.  You develop a temperature of 102 F (38.9 C) with the pain.  You are still in pain after 4 hours of taking prescription medication for the pain.  You need depression medication.    Your IUD is causing pain and you want it removed. SEEK IMMEDIATE MEDICAL CARE IF:  You develop very severe pain or tenderness.  You faint, have chills, severe weakness or dehydration.  You develop heavy vaginal bleeding or passing solid tissue.  You develop a temperature of 102 F (38.9 C)  with the pain.  You have blood in the urine.  You are being physically or sexually abused.  You have uncontrolled vomiting and diarrhea.  You are depressed and afraid of harming yourself or someone else. Document Released: 08/06/2004 Document Revised: 09/21/2011 Document Reviewed: 05/03/2008 [facility] Patient Information 2013 [place], [place]. Medroxyprogesterone injection [Contraceptive] What is this medicine? MEDROXYPROGESTERONE (me DROX ee proe JES te rone) contraceptive injections prevent pregnancy. They provide effective birth control for 3 months. Depo-subQ Provera 104 is also used for treating pain related to endometriosis. This medicine may be used for other purposes; ask your health care provider or pharmacist if you have questions. What should I tell my health care provider before I take this medicine? They need to know if you have any of these conditions: -frequently drink alcohol -asthma -blood vessel disease or a history of a blood clot in the lungs or legs -bone disease such as osteoporosis -breast cancer -diabetes -eating disorder (anorexia nervosa or bulimia) -high blood pressure -HIV infection or AIDS -kidney disease -liver disease -mental depression -migraine -seizures (convulsions) -stroke -tobacco smoker -vaginal bleeding -an unusual or allergic reaction to medroxyprogesterone, other hormones, medicines, foods, dyes, or preservatives -pregnant or trying to get pregnant -breast-feeding How should I use this medicine? Depo-Provera Contraceptive injection is given into a muscle. Depo-subQ Provera 104 injection is given under the skin. These injections are given by a health care professional. You must not be pregnant before getting an injection. The injection is usually given during the first 5 days after the start of a menstrual period or 6 weeks after delivery of a baby. Talk to your pediatrician regarding the use of this medicine in children. Special care may  be needed. These injections have been used in female children who have started having menstrual periods. Overdosage: If you think you have taken too much of this medicine contact a poison control center or emergency room at once. NOTE: This medicine is only for you. Do not share this medicine with others. What if I miss a dose? Try not to miss a dose. You must get an injection once every 3 months to maintain birth control. If you cannot keep an appointment, call and reschedule it. If you wait longer than 13 weeks between Depo-Provera contraceptive injections or longer than 14 weeks between Depo-subQ Provera 104 injections, you could get pregnant. Use another method for birth control if you miss your appointment. You may also need a pregnancy test before receiving another injection. What may interact with this medicine? Do not take this medicine with any of the following medications: -bosentan This medicine may also interact with the following medications: -aminoglutethimide -antibiotics or medicines for infections, especially rifampin, rifabutin, rifapentine, and griseofulvin -aprepitant -barbiturate medicines such as phenobarbital or primidone -bexarotene -carbamazepine -medicines for seizures like ethotoin, felbamate, oxcarbazepine, phenytoin, topiramate -modafinil -St. John's wort This list may not describe all possible interactions. Give your health care provider a list of all the medicines, herbs, non-prescription drugs, or dietary supplements you use. Also tell them if you smoke, drink alcohol, or use illegal drugs. Some items may interact with your medicine. What should I watch for while using this medicine? This drug does not protect  you against HIV infection (AIDS) or other sexually transmitted diseases. Use of this product may cause you to lose calcium from your bones. Loss of calcium may cause weak bones (osteoporosis). Only use this product for more than 2 years if other forms of  birth control are not right for you. The longer you use this product for birth control the more likely you will be at risk for weak bones. Ask your health care professional how you can keep strong bones. You may have a change in bleeding pattern or irregular periods. Many females stop having periods while taking this drug. If you have received your injections on time, your chance of being pregnant is very low. If you think you may be pregnant, see your health care professional as soon as possible. Tell your health care professional if you want to get pregnant within the next year. The effect of this medicine may last a long time after you get your last injection. What side effects may I notice from receiving this medicine? Side effects that you should report to your doctor or health care professional as soon as possible: -allergic reactions like skin rash, itching or hives, swelling of the face, lips, or tongue -breast tenderness or discharge -breathing problems -changes in vision -depression -feeling faint or lightheaded, falls -fever -pain in the abdomen, chest, groin, or leg -problems with balance, talking, walking -unusually weak or tired -yellowing of the eyes or skin Side effects that usually do not require medical attention (report to your doctor or health care professional if they continue or are bothersome): -acne -fluid retention and swelling -headache -irregular periods, spotting, or absent periods -temporary pain, itching, or skin reaction at site where injected -weight gain This list may not describe all possible side effects. Call your doctor for medical advice about side effects. You may report side effects to FDA at 1-800-FDA-1088. Where should I keep my medicine? This does not apply. The injection will be given to you by a health care professional. NOTE: This sheet is a summary. It may not cover all possible information. If you have questions about this medicine, talk to  your doctor, pharmacist, or health care provider.  2013, [employer]. (07/20/2008 6:37:56 PM)

## 2012-06-15 NOTE — Progress Notes (Signed)
Pt would like Depo Provera for birth control

## 2012-06-17 ENCOUNTER — Telehealth: Payer: Self-pay | Admitting: *Deleted

## 2012-06-17 NOTE — Telephone Encounter (Signed)
Called patient , unable to leave message. Sent [facility] form, need to refax after patient reached and complete date.

## 2012-06-17 NOTE — Telephone Encounter (Signed)
Message copied by Gerome Apley on Fri Jun 17, 2012 10:40 AM ------      Message from: Danae Orleans      Created: Fri Jun 17, 2012  7:36 AM       Please treat pos chlamydia      Zithromax 1 Gm po

## 2012-06-20 ENCOUNTER — Ambulatory Visit (HOSPITAL_COMMUNITY): Payer: Medicaid Other | Attending: Obstetrics and Gynecology

## 2012-06-22 NOTE — Telephone Encounter (Addendum)
Called pt and left message that I am calling with test result information and it is very important for her to return the call. Please call back and state when she can be reached or if we may leave the results information on her voice mail. 12/16  1620- called pt and left message to call our office for important test result information.  Certified letter sent.

## 2012-06-27 ENCOUNTER — Encounter: Payer: Self-pay | Admitting: *Deleted

## 2012-08-31 ENCOUNTER — Ambulatory Visit: Payer: Medicaid Other

## 2012-11-10 ENCOUNTER — Ambulatory Visit: Payer: Medicaid Other | Admitting: Obstetrics & Gynecology

## 2012-11-10 ENCOUNTER — Ambulatory Visit: Payer: Medicaid Other | Admitting: Medical

## 2013-06-21 ENCOUNTER — Emergency Department (HOSPITAL_COMMUNITY)
Admission: EM | Admit: 2013-06-21 | Discharge: 2013-06-21 | Disposition: A | Discharge status: Home / Self Care | Payer: Medicaid Other | Attending: Emergency Medicine | Admitting: Emergency Medicine

## 2013-06-21 ENCOUNTER — Encounter (HOSPITAL_COMMUNITY): Payer: Self-pay | Admitting: Emergency Medicine

## 2013-06-21 DIAGNOSIS — R509 Fever, unspecified: Secondary | ICD-10-CM | POA: Insufficient documentation

## 2013-06-21 DIAGNOSIS — B349 Viral infection, unspecified: Secondary | ICD-10-CM

## 2013-06-21 DIAGNOSIS — IMO0001 Reserved for inherently not codable concepts without codable children: Secondary | ICD-10-CM | POA: Insufficient documentation

## 2013-06-21 DIAGNOSIS — Z8719 Personal history of other diseases of the digestive system: Secondary | ICD-10-CM | POA: Insufficient documentation

## 2013-06-21 DIAGNOSIS — R52 Pain, unspecified: Secondary | ICD-10-CM | POA: Insufficient documentation

## 2013-06-21 DIAGNOSIS — R51 Headache: Secondary | ICD-10-CM | POA: Insufficient documentation

## 2013-06-21 DIAGNOSIS — D649 Anemia, unspecified: Secondary | ICD-10-CM | POA: Insufficient documentation

## 2013-06-21 DIAGNOSIS — Z8659 Personal history of other mental and behavioral disorders: Secondary | ICD-10-CM | POA: Insufficient documentation

## 2013-06-21 DIAGNOSIS — R000 Tachycardia, unspecified: Secondary | ICD-10-CM | POA: Insufficient documentation

## 2013-06-21 DIAGNOSIS — B9789 Other viral agents as the cause of diseases classified elsewhere: Secondary | ICD-10-CM | POA: Insufficient documentation

## 2013-06-21 DIAGNOSIS — Z79899 Other long term (current) drug therapy: Secondary | ICD-10-CM | POA: Insufficient documentation

## 2013-06-21 DIAGNOSIS — Z87440 Personal history of urinary (tract) infections: Secondary | ICD-10-CM | POA: Insufficient documentation

## 2013-06-21 MED ORDER — ACETAMINOPHEN 500 MG PO TABS
1000.0000 mg | ORAL_TABLET | Freq: Once | ORAL | Status: AC
  Administered 2013-06-21: 1000 mg via ORAL
  Filled 2013-06-21: qty 2

## 2013-06-21 MED ORDER — ACETAMINOPHEN 325 MG PO TABS: 650.0000 mg | ORAL_TABLET | Freq: Once | ORAL | Status: DC

## 2013-06-21 NOTE — ED Provider Notes (Signed)
CSN: [id number]     Arrival date & time 06/21/13  1818 History   First MD Initiated Contact with Patient 06/21/13 1958     Chief Complaint  Patient presents with  . Fever  . Chills  . Generalized Body Aches   (Consider location/radiation/quality/duration/timing/severity/associated sxs/prior Treatment) HPI Comments: 22 year old female presents with complaints of fever chills and body aches for 3 days. States symptoms have been waxing and waning for several days.Has been taking NyQuil at night no other treatments tried. No neck stiffness. No rashes. No dysuria.   Patient is a 22 y.o. female presenting with fever.  Fever Max temp prior to arrival:  Subjective Temp source:  Subjective Severity:  Moderate Onset quality:  Gradual Duration:  3 days Timing:  Constant Progression:  Unchanged Chronicity:  New Relieved by:  Nothing Worsened by:  Nothing tried Ineffective treatments:  None tried Associated symptoms: chills, headaches and myalgias   Associated symptoms: no chest pain, no cough, no dysuria, no rash and no sore throat     Past Medical History  Diagnosis Date  . Blood transfusion     2010 s/p SVD  . GERD (gastroesophageal reflux disease)   . Mental disorder   . Preterm labor     first pregnancy/current pregnancy  . Anemia   . UTI (urinary tract infection) in pregnancy, antepartum     09/2011 tx with abx  . Depression     not on meds  . Pregnancy induced hypertension 2010    required Mag Sulfate   Past Surgical History  Procedure Laterality Date  . Finger surgery      right thumb in childhood   Family History  Problem Relation Age of Onset  . Asthma Mother   . Cancer Mother     colon  . Diabetes Father   . Hypertension Father   . Mental illness Brother     bipolar, schizophrenia  . Anesthesia problems Neg Hx    History  Substance Use Topics  . Smoking status: Never Smoker   . Smokeless tobacco: Never Used  . Alcohol Use: No     Comment: last alcohol  in September   OB History   Grav Para Term Preterm Abortions TAB SAB Ect Mult Living   3 3 2 1  0 0 0 0 0 3     Review of Systems  Constitutional: Positive for fever and chills.  HENT: Negative for sore throat.   Eyes: Negative for visual disturbance.  Respiratory: Negative for cough and shortness of breath.   Cardiovascular: Negative for chest pain.  Gastrointestinal: Negative for abdominal pain.  Genitourinary: Negative for dysuria.  Musculoskeletal: Positive for myalgias.  Skin: Negative for rash.  Neurological: Positive for headaches.  All other systems reviewed and are negative.    Allergies  Review of patient's allergies indicates no known allergies.  Home Medications   Current Outpatient Rx  Name  Route  Sig  Dispense  Refill  . Pseudoeph-Doxylamine-DM-APAP (NYQUIL MULTI-SYMPTOM PO)   Oral   Take 2 capsules by mouth daily as needed (for cold).         Di Kindle SULFATE PO   Oral   Take 1 tablet by mouth daily.          BP 119/63  Pulse 90  Temp(Src) 99.4 F (37.4 C) (Oral)  Resp 16  SpO2 97% Physical Exam  Nursing note and vitals reviewed. Constitutional: She is oriented to person, place, and time. She appears well-developed and well-nourished.  HENT:  Head: Normocephalic and atraumatic.  Mouth/Throat: Oropharynx is clear and moist. No oropharyngeal exudate.  Eyes: EOM are normal. Pupils are equal, round, and reactive to light.  Neck: Normal range of motion.  Cardiovascular: Regular rhythm and intact distal pulses.   Tachycardic  Pulmonary/Chest: Effort normal and breath sounds normal. No respiratory distress. She has no wheezes. She exhibits no tenderness.  Clear to auscultation bilaterally  Abdominal: Soft. She exhibits no distension. There is no tenderness. There is no rebound and no guarding.  Musculoskeletal: Normal range of motion. She exhibits no edema.  Subjective myalgias throughout. No rashes, erythema  Lymphadenopathy:    She has no  cervical adenopathy.  Neurological: She is alert and oriented to person, place, and time. No cranial nerve deficit. She exhibits normal muscle tone. Coordination normal.  Skin: Skin is warm and dry. No rash noted. No erythema.  Psychiatric: She has a normal mood and affect.    ED Course  Procedures (including critical care time) Labs Review Labs Reviewed - No data to display Imaging Review No results found.  EKG Interpretation   None       MDM   1. Viral illness   2. Fever     22 year old female signs and symptoms consistent with a viral prodrome. Patient with subjective fevers, sweats, chills and diffuse muscle aches. On arrival patient initially tachycardic however she is also febrile to 103. She was given Tylenol for this. On exam patient has no concerning findings. Lungs are clear to auscultation bilaterally. She has no wheezing. No history of cough. Patient denies any dysuria and has no CVA tenderness. No signs of meningismus on exam. No abdominal pain. Benign abdominal exam with no signs of peritonitis, rebound or guarding. No influenza testing in this patient as she is otherwise healthy. Not in the window for Tamiflu treatment. No concerning signs on exam. Patiently this is a viral prodrome. Instructed to increase by mouth fluid intakes. Patient has also been taking only NyQuil once a day for the symptoms. Have recommended she alternate Tylenol and ibuprofen every 3-4 hours. Also cautioned against taking Tylenol and NyQuil as NyQuil contains acetaminophen. Discussed the patient fevers and viral illnesses often last up to one week. Return precautions were discussed and patient mother was understanding. Patient remained stable with tachycardia resolving with Tylenol. Appropriate for discharge at this time.    Bridgett Larsson, MD 06/21/13 [id number]  Bridgett Larsson, MD 06/21/13 2028

## 2013-06-21 NOTE — ED Notes (Signed)
PT ambulated with baseline gait; VSS; A&Ox3; no signs of distress; respirations even and unlabored; skin warm and dry; no questions upon discharge.  

## 2013-06-21 NOTE — ED Notes (Signed)
Pt presents to department for evaluation of fever, chills, and body aches. Ongoing x3 days. States symptoms became worse today.

## 2013-06-22 NOTE — ED Provider Notes (Signed)
I saw and evaluated the patient, reviewed the resident's note and I agree with the findings and plan.  EKG Interpretation   None       Patient is well appearing with no focal findings. Mild headache but is associated with fever and myalgias, likely a viral syndrome. Exam is benign, doubt meningitis, PNA, UTI or abd process. Will treat symptomatically.  Audree Camel, MD 06/22/13 1257

## 2014-02-05 ENCOUNTER — Encounter (HOSPITAL_COMMUNITY): Payer: Self-pay | Admitting: Emergency Medicine

## 2014-02-05 ENCOUNTER — Emergency Department (HOSPITAL_COMMUNITY)
Admission: EM | Admit: 2014-02-05 | Discharge: 2014-02-05 | Disposition: A | Discharge status: Home / Self Care | Payer: Medicaid Other | Attending: Emergency Medicine | Admitting: Emergency Medicine

## 2014-02-05 DIAGNOSIS — Z8659 Personal history of other mental and behavioral disorders: Secondary | ICD-10-CM | POA: Insufficient documentation

## 2014-02-05 DIAGNOSIS — Z87440 Personal history of urinary (tract) infections: Secondary | ICD-10-CM | POA: Diagnosis not present

## 2014-02-05 DIAGNOSIS — M549 Dorsalgia, unspecified: Secondary | ICD-10-CM | POA: Insufficient documentation

## 2014-02-05 DIAGNOSIS — N898 Other specified noninflammatory disorders of vagina: Secondary | ICD-10-CM | POA: Diagnosis not present

## 2014-02-05 DIAGNOSIS — Z862 Personal history of diseases of the blood and blood-forming organs and certain disorders involving the immune mechanism: Secondary | ICD-10-CM | POA: Insufficient documentation

## 2014-02-05 DIAGNOSIS — Z3202 Encounter for pregnancy test, result negative: Secondary | ICD-10-CM | POA: Insufficient documentation

## 2014-02-05 DIAGNOSIS — Z8719 Personal history of other diseases of the digestive system: Secondary | ICD-10-CM | POA: Diagnosis not present

## 2014-02-05 DIAGNOSIS — N12 Tubulo-interstitial nephritis, not specified as acute or chronic: Secondary | ICD-10-CM | POA: Insufficient documentation

## 2014-02-05 LAB — I-STAT CHEM 8, ED
BUN: 6 mg/dL (ref 6–23)
CALCIUM ION: 1.24 mmol/L — AB (ref 1.12–1.23)
Chloride: 106 mEq/L (ref 96–112)
Creatinine, Ser: 0.8 mg/dL (ref 0.50–1.10)
Glucose, Bld: 82 mg/dL (ref 70–99)
HCT: 42 % (ref 36.0–46.0)
Hemoglobin: 14.3 g/dL (ref 12.0–15.0)
Potassium: 3.9 mEq/L (ref 3.7–5.3)
Sodium: 139 mEq/L (ref 137–147)
TCO2: 22 mmol/L (ref 0–100)

## 2014-02-05 LAB — URINALYSIS, ROUTINE W REFLEX MICROSCOPIC
BILIRUBIN URINE: NEGATIVE
Bilirubin Urine: NEGATIVE
Glucose, UA: NEGATIVE mg/dL
Glucose, UA: NEGATIVE mg/dL
Ketones, ur: NEGATIVE mg/dL
Ketones, ur: NEGATIVE mg/dL
NITRITE: NEGATIVE
Nitrite: NEGATIVE
PH: 6 (ref 5.0–8.0)
PROTEIN: 100 mg/dL — AB
PROTEIN: NEGATIVE mg/dL
Specific Gravity, Urine: 1.023 (ref 1.005–1.030)
Specific Gravity, Urine: 1.01 (ref 1.005–1.030)
UROBILINOGEN UA: 0.2 mg/dL (ref 0.0–1.0)
Urobilinogen, UA: 0.2 mg/dL (ref 0.0–1.0)
pH: 6 (ref 5.0–8.0)

## 2014-02-05 LAB — WET PREP, GENITAL
CLUE CELLS WET PREP: NONE SEEN
Trich, Wet Prep: NONE SEEN
Yeast Wet Prep HPF POC: NONE SEEN

## 2014-02-05 LAB — POC URINE PREG, ED: PREG TEST UR: NEGATIVE

## 2014-02-05 LAB — URINE MICROSCOPIC-ADD ON

## 2014-02-05 LAB — HIV ANTIBODY (ROUTINE TESTING W REFLEX): HIV 1&2 Ab, 4th Generation: NONREACTIVE

## 2014-02-05 LAB — RPR

## 2014-02-05 MED ORDER — DEXTROSE 5 % IV SOLN
1.0000 g | Freq: Once | INTRAVENOUS | Status: AC
  Administered 2014-02-05: 1 g via INTRAVENOUS
  Filled 2014-02-05: qty 10

## 2014-02-05 MED ORDER — PHENAZOPYRIDINE HCL 200 MG PO TABS: 200.0000 mg | ORAL_TABLET | Freq: Three times a day (TID) | ORAL | Status: DC

## 2014-02-05 MED ORDER — ONDANSETRON 4 MG PO TBDP
4.0000 mg | ORAL_TABLET | Freq: Once | ORAL | Status: AC
  Administered 2014-02-05: 4 mg via ORAL
  Filled 2014-02-05: qty 1

## 2014-02-05 MED ORDER — OXYCODONE-ACETAMINOPHEN 5-325 MG PO TABS
1.0000 | ORAL_TABLET | Freq: Once | ORAL | Status: AC
  Administered 2014-02-05: 1 via ORAL
  Filled 2014-02-05: qty 1

## 2014-02-05 MED ORDER — KETOROLAC TROMETHAMINE 30 MG/ML IJ SOLN
30.0000 mg | Freq: Once | INTRAMUSCULAR | Status: AC
  Administered 2014-02-05: 30 mg via INTRAMUSCULAR
  Filled 2014-02-05: qty 1

## 2014-02-05 MED ORDER — PROMETHAZINE HCL 25 MG PO TABS: 25.0000 mg | ORAL_TABLET | Freq: Four times a day (QID) | ORAL | Status: DC | PRN

## 2014-02-05 MED ORDER — CIPROFLOXACIN HCL 500 MG PO TABS: 500.0000 mg | ORAL_TABLET | Freq: Two times a day (BID) | ORAL | Status: DC

## 2014-02-05 MED ORDER — TRAMADOL HCL 50 MG PO TABS
50.0000 mg | ORAL_TABLET | Freq: Four times a day (QID) | ORAL | Status: DC | PRN
Start: 2014-02-05 — End: 2014-04-18

## 2014-02-05 NOTE — ED Provider Notes (Signed)
CSN: [id number]     Arrival date & time 02/05/14  1007 History   First MD Initiated Contact with Patient 02/05/14 1009     Chief Complaint  Patient presents with  . Urinary Frequency  . Back Pain  . Vaginal Bleeding     (Consider location/radiation/quality/duration/timing/severity/associated sxs/prior Treatment) HPI  Patient to ER with complaints of "early period" 1.5 weeks early, urine incontinence and low back pain. The symptoms started last night followed by the back pain this morning- low and bilateral. She reports that she could be pregnant and isn't sure. She denies having any pain when she urinates, denies fevers, nausea, vomiting, diarrhea, weakness, confusion. She has PMH of GERD, UTI, depression and hypertension. Her vital signs are all within normal limits at this time,  Past Medical History  Diagnosis Date  . Blood transfusion     2010 s/p SVD  . GERD (gastroesophageal reflux disease)   . Mental disorder   . Preterm labor     first pregnancy/current pregnancy  . Anemia   . UTI (urinary tract infection) in pregnancy, antepartum     09/2011 tx with abx  . Depression     not on meds  . Pregnancy induced hypertension 2010    required Mag Sulfate   Past Surgical History  Procedure Laterality Date  . Finger surgery      right thumb in childhood   Family History  Problem Relation Age of Onset  . Asthma Mother   . Cancer Mother     colon  . Diabetes Father   . Hypertension Father   . Mental illness Brother     bipolar, schizophrenia  . Anesthesia problems Neg Hx    History  Substance Use Topics  . Smoking status: Never Smoker   . Smokeless tobacco: Never Used  . Alcohol Use: No     Comment: last alcohol in September   OB History   Grav Para Term Preterm Abortions TAB SAB Ect Mult Living   3 3 2 1  0 0 0 0 0 3     Review of Systems   Review of Systems  Gen: no weight loss, fevers, chills, night sweats  Eyes: no discharge or drainage, no occular pain  or visual changes  Nose: no epistaxis or rhinorrhea  Mouth: no dental pain, no sore throat  Neck: no neck pain  Lungs:No wheezing or hemoptysis No coughing CV:  No palpitations, dependent edema or orthopnea. No chest pain Abd: no diarrhea. No nausea or vomiting, No abdominal pain  GU: no dysuria or gross hematuria + urine incontinence and vaginal bleeding MSK:  No muscle weakness, + Low back  pain Neuro: no headache, no focal neurologic deficits  Skin: no rash , no wounds Psyche: no complaints    Allergies  Review of patient's allergies indicates no known allergies.  Home Medications   Prior to Admission medications   Medication Sig Start Date End Date Taking? Authorizing Provider  acetaminophen (TYLENOL) 325 MG tablet Take 650 mg by mouth every 6 (six) hours as needed for moderate pain.   Yes Historical Provider, MD  ibuprofen (ADVIL,MOTRIN) 800 MG tablet Take 800 mg by mouth every 8 (eight) hours as needed for moderate pain.   Yes Historical Provider, MD  ciprofloxacin (CIPRO) 500 MG tablet Take 1 tablet (500 mg total) by mouth 2 (two) times daily. 02/05/14   Jaxzen Vanhorn Irine SealG Taiwan Millon, PA-C  phenazopyridine (PYRIDIUM) 200 MG tablet Take 1 tablet (200 mg total) by mouth 3 (three)  times daily. 02/05/14   Dagon Budai Irine Seal, PA-C  promethazine (PHENERGAN) 25 MG tablet Take 1 tablet (25 mg total) by mouth every 6 (six) hours as needed for nausea or vomiting. 02/05/14   Dorthula Matas, PA-C  traMADol (ULTRAM) 50 MG tablet Take 1 tablet (50 mg total) by mouth every 6 (six) hours as needed. 02/05/14   Ziv Welchel Irine Seal, PA-C   BP 102/57  Pulse 64  Temp(Src) 97.8 F (36.6 C) (Oral)  Resp 18  Ht 5\' 8"  (1.727 m)  Wt 152 lb (68.947 kg)  BMI 23.12 kg/m2  SpO2 100%  LMP 01/16/2014 Physical Exam  Nursing note and vitals reviewed. Constitutional: She appears well-developed and well-nourished. No distress.  HENT:  Head: Normocephalic and atraumatic.  Eyes: Pupils are equal, round, and reactive to  light.  Neck: Normal range of motion. Neck supple.  Cardiovascular: Normal rate and regular rhythm.   Pulmonary/Chest: Effort normal.  Abdominal: Soft. Bowel sounds are normal. There is tenderness in the suprapubic area. There is CVA tenderness. There is no rigidity, no rebound and no guarding.    Genitourinary: Uterus normal. Cervix exhibits no motion tenderness, no discharge and no friability. Right adnexum displays no mass, no tenderness and no fullness. Left adnexum displays no mass, no tenderness and no fullness. There is bleeding around the vagina.  Musculoskeletal:       Back:  Neurological: She is alert.  Skin: Skin is warm and dry.    ED Course  Procedures (including critical care time) Labs Review Labs Reviewed  WET PREP, GENITAL - Abnormal; Notable for the following:    WBC, Wet Prep HPF POC FEW (*)    All other components within normal limits  URINALYSIS, ROUTINE W REFLEX MICROSCOPIC - Abnormal; Notable for the following:    APPearance TURBID (*)    Hgb urine dipstick LARGE (*)    Protein, ur 100 (*)    Leukocytes, UA SMALL (*)    All other components within normal limits  URINE MICROSCOPIC-ADD ON - Abnormal; Notable for the following:    Squamous Epithelial / LPF MANY (*)    Bacteria, UA MANY (*)    All other components within normal limits  URINALYSIS, ROUTINE W REFLEX MICROSCOPIC - Abnormal; Notable for the following:    Hgb urine dipstick LARGE (*)    Leukocytes, UA MODERATE (*)    All other components within normal limits  URINE MICROSCOPIC-ADD ON - Abnormal; Notable for the following:    Squamous Epithelial / LPF FEW (*)    Bacteria, UA FEW (*)    All other components within normal limits  I-STAT CHEM 8, ED - Abnormal; Notable for the following:    Calcium, Ion 1.24 (*)    All other components within normal limits  GC/CHLAMYDIA PROBE AMP  URINE CULTURE  HIV ANTIBODY (ROUTINE TESTING)  RPR  POC URINE PREG, ED    Imaging Review No results found.    EKG Interpretation None      MDM   Final diagnoses:  Pyelonephritis    Patient having vaginal bleeding, urine incontinence and low back pain for < 24 hours.   Patients first urine was contaminated, I suggested in and out cath but she refused. She said she would try to do a cleaner sample herself first. Her pelvic was unremarkable. Her urine came back showing leukocytes, clinically she is presenting like a pyelo. Will give 1 gram IV Rocephin. Cipro BID for two weeks, pain medication, nausea medications.  Patient  most likely started her menstrual cycle early, referral to [facility]' outpatient clinic for follow-up  23 y.o.[name]'s evaluation in the Emergency Department is complete. It has been determined that no acute conditions requiring further emergency intervention are present at this time. The patient/guardian have been advised of the diagnosis and plan. We have discussed signs and symptoms that warrant return to the ED, such as changes or worsening in symptoms.  Vital signs are stable at discharge. Filed Vitals:   02/05/14 1449  BP:   Pulse:   Temp: 97.8 F (36.6 C)  Resp:     Patient/guardian has voiced understanding and agreed to follow-up with the PCP or specialist.     Dorthula Matas, PA-C 02/05/14 1536

## 2014-02-05 NOTE — ED Notes (Signed)
Pt. Stated, I started having problems with urine.  I peed on myself last night and this morning and I don't know it. My back is hurting also.

## 2014-02-05 NOTE — Discharge Instructions (Signed)
Pyelonephritis, Adult °Pyelonephritis is a kidney infection. In general, there are 2 main types of pyelonephritis: °· Infections that come on quickly without any warning (acute pyelonephritis). °· Infections that persist for a long period of time (chronic pyelonephritis). °CAUSES  °Two main causes of pyelonephritis are: °· Bacteria traveling from the bladder to the kidney. This is a problem especially in pregnant women. The urine in the bladder can become filled with bacteria from multiple causes, including: °¨ Inflammation of the prostate gland (prostatitis). °¨ Sexual intercourse in females. °¨ Bladder infection (cystitis). °· Bacteria traveling from the bloodstream to the tissue part of the kidney. °Problems that may increase your risk of getting a kidney infection include: °· Diabetes. °· Kidney stones or bladder stones. °· Cancer. °· Catheters placed in the bladder. °· Other abnormalities of the kidney or ureter. °SYMPTOMS  °· Abdominal pain. °· Pain in the side or flank area. °· Fever. °· Chills. °· Upset stomach. °· Blood in the urine (dark urine). °· Frequent urination. °· Strong or persistent urge to urinate. °· Burning or stinging when urinating. °DIAGNOSIS  °Your caregiver may diagnose your kidney infection based on your symptoms. A urine sample may also be taken. °TREATMENT  °In general, treatment depends on how severe the infection is.  °· If the infection is mild and caught early, your caregiver may treat you with oral antibiotics and send you home. °· If the infection is more severe, the bacteria may have gotten into the bloodstream. This will require intravenous (IV) antibiotics and a hospital stay. Symptoms may include: °¨ High fever. °¨ Severe flank pain. °¨ Shaking chills. °· Even after a hospital stay, your caregiver may require you to be on oral antibiotics for a period of time. °· Other treatments may be required depending upon the cause of the infection. °HOME CARE INSTRUCTIONS  °· Take your  antibiotics as directed. Finish them even if you start to feel better. °· Make an appointment to have your urine checked to make sure the infection is gone. °· Drink enough fluids to keep your urine clear or pale yellow. °· Take medicines for the bladder if you have urgency and frequency of urination as directed by your caregiver. °SEEK IMMEDIATE MEDICAL CARE IF:  °· You have a fever or persistent symptoms for more than 2-3 days. °· You have a fever and your symptoms suddenly get worse. °· You are unable to take your antibiotics or fluids. °· You develop shaking chills. °· You experience extreme weakness or fainting. °· There is no improvement after 2 days of treatment. °MAKE SURE YOU: °· Understand these instructions. °· Will watch your condition. °· Will get help right away if you are not doing well or get worse. °Document Released: 06/29/2005 Document Revised: 12/29/2011 Document Reviewed: 12/03/2010 °ExitCare® Patient Information ©2015 [employer]. This information is not intended to replace advice given to you by your health care provider. Make sure you discuss any questions you have with your health care provider. ° °

## 2014-02-06 LAB — URINE CULTURE

## 2014-02-06 LAB — GC/CHLAMYDIA PROBE AMP
CT Probe RNA: NEGATIVE
GC PROBE AMP APTIMA: NEGATIVE

## 2014-02-13 NOTE — ED Provider Notes (Signed)
Medical screening examination/treatment/procedure(s) were performed by non-physician practitioner and as supervising physician I was immediately available for consultation/collaboration.   EKG Interpretation None       Hurman HornJohn M Branon Sabine, MD 02/13/14 1435

## 2014-04-18 ENCOUNTER — Encounter (HOSPITAL_COMMUNITY): Payer: Self-pay | Admitting: *Deleted

## 2014-04-18 ENCOUNTER — Inpatient Hospital Stay (HOSPITAL_COMMUNITY)
Admission: AD | Admit: 2014-04-18 | Discharge: 2014-04-18 | Disposition: A | Discharge status: Home / Self Care | Payer: Medicaid Other | Source: Ambulatory Visit | Attending: Obstetrics & Gynecology | Admitting: Obstetrics & Gynecology

## 2014-04-18 DIAGNOSIS — K529 Noninfective gastroenteritis and colitis, unspecified: Secondary | ICD-10-CM | POA: Diagnosis not present

## 2014-04-18 DIAGNOSIS — F17200 Nicotine dependence, unspecified, uncomplicated: Secondary | ICD-10-CM | POA: Insufficient documentation

## 2014-04-18 DIAGNOSIS — R112 Nausea with vomiting, unspecified: Secondary | ICD-10-CM | POA: Diagnosis present

## 2014-04-18 HISTORY — DX: Other chronic pain: G89.29

## 2014-04-18 HISTORY — DX: Dorsalgia, unspecified: M54.9

## 2014-04-18 LAB — URINALYSIS, ROUTINE W REFLEX MICROSCOPIC
Bilirubin Urine: NEGATIVE
Glucose, UA: NEGATIVE mg/dL
Hgb urine dipstick: NEGATIVE
Ketones, ur: NEGATIVE mg/dL
NITRITE: NEGATIVE
PH: 5.5 (ref 5.0–8.0)
Protein, ur: NEGATIVE mg/dL
SPECIFIC GRAVITY, URINE: 1.025 (ref 1.005–1.030)
Urobilinogen, UA: 0.2 mg/dL (ref 0.0–1.0)

## 2014-04-18 LAB — POCT PREGNANCY, URINE: Preg Test, Ur: NEGATIVE

## 2014-04-18 LAB — URINE MICROSCOPIC-ADD ON

## 2014-04-18 MED ORDER — ZOFRAN 4 MG PO TABS: 4.0000 mg | ORAL_TABLET | Freq: Three times a day (TID) | ORAL | Status: DC | PRN

## 2014-04-18 NOTE — MAU Note (Signed)
C/o chronic intermittent lower back and lower abdominal pain for past 2 years; had rx for hydrocodone -at one time; does not have a rx at present;

## 2014-04-18 NOTE — MAU Note (Signed)
C/o N&V for past 7-10 days; had diarrhea a couple of days; ? Pregnancy;

## 2014-04-18 NOTE — MAU Note (Signed)
UPT was negative today; denies anyone else being sick at home;

## 2014-04-18 NOTE — MAU Provider Note (Signed)
Attestation of Attending Supervision of Advanced Practitioner (CNM/NP): Evaluation and management procedures were performed by the Advanced Practitioner under my supervision and collaboration.  I have reviewed the Advanced Practitioner's note and chart, and I agree with the management and plan.  HARRAWAY-SMITH, Jadi Deyarmin 1:38 PM     

## 2014-04-18 NOTE — MAU Provider Note (Signed)
History     CSN: [id number]  Arrival date and time: 04/18/14 1031   First Provider Initiated Contact with Patient 04/18/14 1217      Chief Complaint  Patient presents with  . Nausea  . Emesis  . Diarrhea   HPI  [name] is a 23 y.o. female who presents with nausea, vomiting and diarrhea for 7 days.  The patient arrived via EMS. The patient states that she cannot wait any longer because she has to walk home. She has had the same sexual partner for 1 month; denies abnormal vaginal discharge, she does report intermittent lower abdominal cramping. She plans to call or go to the clinic tomorrow with a friend and schedule a "check up."   OB History   Grav Para Term Preterm Abortions TAB SAB Ect Mult Living   3 3 2 1  0 0 0 0 0 3      Past Medical History  Diagnosis Date  . Blood transfusion     2010 s/p SVD  . GERD (gastroesophageal reflux disease)   . Mental disorder   . Preterm labor     first pregnancy/current pregnancy  . Anemia   . UTI (urinary tract infection) in pregnancy, antepartum     09/2011 tx with abx  . Depression     not on meds  . Pregnancy induced hypertension 2010    required Mag Sulfate  . Chronic back pain     Past Surgical History  Procedure Laterality Date  . Finger surgery      right thumb in childhood    Family History  Problem Relation Age of Onset  . Asthma Mother   . Cancer Mother     colon  . Diabetes Father   . Hypertension Father   . Mental illness Brother     bipolar, schizophrenia  . Anesthesia problems Neg Hx     History  Substance Use Topics  . Smoking status: Current Every Day Smoker -- 0.50 packs/day  . Smokeless tobacco: Never Used  . Alcohol Use: No     Comment: last alcohol in September    Allergies: No Known Allergies  No prescriptions prior to admission   Results for orders placed during the hospital encounter of 04/18/14 (from the past 48 hour(s))  URINALYSIS, ROUTINE W REFLEX MICROSCOPIC      Status: Abnormal   Collection Time    04/18/14 10:35 AM      Result Value Ref Range   Color, Urine YELLOW  YELLOW   APPearance CLEAR  CLEAR   Specific Gravity, Urine 1.025  1.005 - 1.030   pH 5.5  5.0 - 8.0   Glucose, UA NEGATIVE  NEGATIVE mg/dL   Hgb urine dipstick NEGATIVE  NEGATIVE   Bilirubin Urine NEGATIVE  NEGATIVE   Ketones, ur NEGATIVE  NEGATIVE mg/dL   Protein, ur NEGATIVE  NEGATIVE mg/dL   Urobilinogen, UA 0.2  0.0 - 1.0 mg/dL   Nitrite NEGATIVE  NEGATIVE   Leukocytes, UA SMALL (*) NEGATIVE  URINE MICROSCOPIC-ADD ON     Status: Abnormal   Collection Time    04/18/14 10:35 AM      Result Value Ref Range   Squamous Epithelial / LPF MANY (*) RARE   WBC, UA 3-6  <3 WBC/hpf   RBC / HPF 0-2  <3 RBC/hpf   Bacteria, UA RARE  RARE  POCT PREGNANCY, URINE     Status: None   Collection Time    04/18/14 10:52  AM      Result Value Ref Range   Preg Test, Ur NEGATIVE  NEGATIVE   Comment:            THE SENSITIVITY OF THIS     METHODOLOGY IS >24 mIU/mL     Review of Systems  Constitutional: Negative for fever and chills.  Gastrointestinal: Positive for nausea, vomiting and abdominal pain (Bilateral lower abdominal pain/cramping ). Negative for diarrhea.  Genitourinary: Negative for dysuria, urgency, frequency and hematuria.   Physical Exam   Blood pressure 117/84, pulse 78, temperature 98.5 F (36.9 C), temperature source Oral, last menstrual period 04/01/2014.  Physical Exam  Constitutional: She is oriented to person, place, and time. She appears well-developed and well-nourished.  Non-toxic appearance. She does not have a sickly appearance. She does not appear ill. No distress.  HENT:  Head: Normocephalic.  Eyes: Pupils are equal, round, and reactive to light.  Neck: Neck supple.  Respiratory: Effort normal.  Musculoskeletal: Normal range of motion.  Neurological: She is alert and oriented to person, place, and time.  Skin: Skin is warm. She is not diaphoretic.   Psychiatric: Her behavior is normal.    MAU Course  Procedures None  MDM UA: no sign of dehydration  Upt Assessment and Plan   A: Gastroenteritis   P: Discharge home in stable condition RX: zofran BRAT diet Return to MAU for emergencies only. Iona HansenJennifer Irene Venkat Ankney, NP 04/18/2014 12:29 PM

## 2014-05-14 ENCOUNTER — Encounter (HOSPITAL_COMMUNITY): Payer: Self-pay | Admitting: *Deleted

## 2014-12-25 ENCOUNTER — Emergency Department (HOSPITAL_COMMUNITY)
Admission: EM | Admit: 2014-12-25 | Discharge: 2014-12-25 | Disposition: A | Discharge status: Home / Self Care | Payer: Medicaid Other | Attending: Emergency Medicine | Admitting: Emergency Medicine

## 2014-12-25 ENCOUNTER — Encounter (HOSPITAL_COMMUNITY): Payer: Self-pay | Admitting: Emergency Medicine

## 2014-12-25 DIAGNOSIS — R079 Chest pain, unspecified: Secondary | ICD-10-CM | POA: Insufficient documentation

## 2014-12-25 DIAGNOSIS — Z862 Personal history of diseases of the blood and blood-forming organs and certain disorders involving the immune mechanism: Secondary | ICD-10-CM | POA: Diagnosis not present

## 2014-12-25 DIAGNOSIS — Z720 Tobacco use: Secondary | ICD-10-CM | POA: Diagnosis not present

## 2014-12-25 DIAGNOSIS — Z8659 Personal history of other mental and behavioral disorders: Secondary | ICD-10-CM | POA: Diagnosis not present

## 2014-12-25 DIAGNOSIS — Z8719 Personal history of other diseases of the digestive system: Secondary | ICD-10-CM | POA: Diagnosis not present

## 2014-12-25 DIAGNOSIS — G8929 Other chronic pain: Secondary | ICD-10-CM | POA: Insufficient documentation

## 2014-12-25 DIAGNOSIS — R55 Syncope and collapse: Secondary | ICD-10-CM | POA: Diagnosis not present

## 2014-12-25 DIAGNOSIS — Z87440 Personal history of urinary (tract) infections: Secondary | ICD-10-CM | POA: Insufficient documentation

## 2014-12-25 DIAGNOSIS — Z3202 Encounter for pregnancy test, result negative: Secondary | ICD-10-CM | POA: Insufficient documentation

## 2014-12-25 DIAGNOSIS — Z8751 Personal history of pre-term labor: Secondary | ICD-10-CM | POA: Diagnosis not present

## 2014-12-25 LAB — POC URINE PREG, ED: Preg Test, Ur: NEGATIVE

## 2014-12-25 NOTE — ED Provider Notes (Signed)
CSN: [id number]     Arrival date & time 12/25/14  1238 History   First MD Initiated Contact with Patient 12/25/14 1240     Chief Complaint  Patient presents with  . Near Syncope  . Chest Pain     (Consider location/radiation/quality/duration/timing/severity/associated sxs/prior Treatment) HPI   [name] is a 24 y.o. female who presents for evaluation of a feeling that she would pass out. She is worried that she might be pregnant.her prior menstrual period was several weeks ago. She reports stress at home. She feels like she is not eating and drinking appropriately. She has bilateral chest pain which has resolved. She was transported by EMS. There are no other known modifying factors.   Past Medical History  Diagnosis Date  . Blood transfusion     2010 s/p SVD  . GERD (gastroesophageal reflux disease)   . Mental disorder   . Preterm labor     first pregnancy/current pregnancy  . Anemia   . UTI (urinary tract infection) in pregnancy, antepartum     09/2011 tx with abx  . Depression     not on meds  . Pregnancy induced hypertension 2010    required Mag Sulfate  . Chronic back pain    Past Surgical History  Procedure Laterality Date  . Finger surgery      right thumb in childhood   Family History  Problem Relation Age of Onset  . Asthma Mother   . Cancer Mother     colon  . Diabetes Father   . Hypertension Father   . Mental illness Brother     bipolar, schizophrenia  . Anesthesia problems Neg Hx    History  Substance Use Topics  . Smoking status: Current Every Day Smoker -- 0.50 packs/day    Types: Cigarettes  . Smokeless tobacco: Never Used  . Alcohol Use: No     Comment: last alcohol in September   OB History    Gravida Para Term Preterm AB TAB SAB Ectopic Multiple Living   3 3 2 1  0 0 0 0 0 3     Review of Systems  All other systems reviewed and are negative.     Allergies  Review of patient's allergies indicates no known allergies.  Home  Medications   Prior to Admission medications   Medication Sig Start Date End Date Taking? Authorizing Provider  ZOFRAN 4 MG tablet Take 1 tablet (4 mg total) by mouth every 8 (eight) hours as needed for nausea or vomiting. 04/18/14   Harolyn Rutherford Rasch, NP   BP 118/70 mmHg  Pulse 76  Temp(Src) 97.8 F (36.6 C) (Oral)  Resp 14  Ht 5\' 8"  (1.727 m)  Wt 156 lb (70.761 kg)  BMI 23.73 kg/m2  SpO2 100%  LMP 11/11/2014 (Approximate) Physical Exam  Constitutional: She is oriented to person, place, and time. She appears well-developed and well-nourished.  HENT:  Head: Normocephalic and atraumatic.  Right Ear: External ear normal.  Left Ear: External ear normal.  Eyes: Conjunctivae and EOM are normal. Pupils are equal, round, and reactive to light.  Neck: Normal range of motion and phonation normal. Neck supple.  Cardiovascular: Normal rate.   Pulmonary/Chest: Effort normal and breath sounds normal. She exhibits no bony tenderness.  Musculoskeletal: Normal range of motion.  Neurological: She is alert and oriented to person, place, and time. No cranial nerve deficit or sensory deficit. She exhibits normal muscle tone. Coordination normal.  She was easily. No dysarthria or  aphasia.  Skin: Skin is warm, dry and intact.  Psychiatric: She has a normal mood and affect. Her behavior is normal. Judgment and thought content normal.  Nursing note and vitals reviewed.   ED Course  Procedures (including critical care time)  The patient declined to have blood drawn.   Medications - No data to display  Patient Vitals for the past 24 hrs:  BP Temp Temp src Pulse Resp SpO2 Height Weight  12/25/14 1247 118/70 mmHg 97.8 F (36.6 C) Oral 76 14 100 % - -  12/25/14 1242 - - - - - -  (1.727 m) 156 lb (70.761 kg)  12/25/14 1238 - - - - - 100 % - -    1:37 PM Reevaluation with update and discussion. After initial assessment and treatment, an updated evaluation reveals patient is comfortable and did  not want any other intervention. Suvan Stcyr L    Labs Review Labs Reviewed  POC URINE PREG, ED    Imaging Review No results found.   EKG Interpretation   Date/Time:  Tuesday December 25 2014 12:43:13 EDT Ventricular Rate:  78 PR Interval:  167 QRS Duration: 92 QT Interval:  390 QTC Calculation: 444 R Axis:   70 Text Interpretation:  Sinus rhythm Probable left atrial enlargement ST  elevation, consider inferior injury since last tracing no significant  change Confirmed by Effie Shy  MD, Nataki Mccrumb ([id number]) on 12/25/2014 1:07:02 PM      MDM   Final diagnoses:  Near syncope    Near-syncope, and irregular menses. No evidence for pregnancy, serious illness or metabolic instability.  Nursing Notes Reviewed/ Care Coordinated Applicable Imaging Reviewed Interpretation of Laboratory Data incorporated into ED treatment  The patient appears reasonably screened and/or stabilized for discharge and I doubt any other medical condition or other [facility] requiring further screening, evaluation, or treatment in the ED at this time prior to discharge.  Plan: Home Medications- none; Home Treatments- rest; return here if the recommended treatment, does not improve the symptoms; Recommended follow up- PCP prn    Mancel Bale, MD 12/25/14 1338

## 2014-12-25 NOTE — ED Notes (Addendum)
Pt reporting increased stress at home- hx of anxiety. Pt tearful at this time stating " I have just been going through a lot." Pt also reporting that she is a fw weeks late on her menstrual cycle.

## 2014-12-25 NOTE — ED Notes (Signed)
Per EMS- pt has been walking around outside since 8am- not drinking much water. Pt reports seeing black spots and almost passing out, then she got a non radiating midsternal achy chest pain. No ASA given, no nitro given. 12 lead from EMS unremarkable.

## 2014-12-25 NOTE — Discharge Instructions (Signed)
Near-Syncope Near-syncope (commonly known as near fainting) is sudden weakness, dizziness, or feeling like you might pass out. During an episode of near-syncope, you may also develop pale skin, have tunnel vision, or feel sick to your stomach (nauseous). Near-syncope may occur when getting up after sitting or while standing for a long time. It is caused by a sudden decrease in blood flow to the brain. This decrease can result from various causes or triggers, most of which are not serious. However, because near-syncope can sometimes be a sign of something serious, a medical evaluation is required. The specific cause is often not determined. HOME CARE INSTRUCTIONS  Monitor your condition for any changes. The following actions may help to alleviate any discomfort you are experiencing:  Have someone stay with you until you feel stable.  Lie down right away and prop your feet up if you start feeling like you might faint. Breathe deeply and steadily. Wait until all the symptoms have passed. Most of these episodes last only a few minutes. You may feel tired for several hours.   Drink enough fluids to keep your urine clear or pale yellow.   If you are taking blood pressure or heart medicine, get up slowly when seated or lying down. Take several minutes to sit and then stand. This can reduce dizziness.  Follow up with your health care provider as directed. SEEK IMMEDIATE MEDICAL CARE IF:   You have a severe headache.   You have unusual pain in the chest, abdomen, or back.   You are bleeding from the mouth or rectum, or you have black or tarry stool.   You have an irregular or very fast heartbeat.   You have repeated fainting or have seizure-like jerking during an episode.   You faint when sitting or lying down.   You have confusion.   You have difficulty walking.   You have severe weakness.   You have vision problems.  MAKE SURE YOU:   Understand these instructions.  Will  watch your condition.  Will get help right away if you are not doing well or get worse. Document Released: 06/29/2005 Document Revised: 07/04/2013 Document Reviewed: 12/02/2012 [employer] Patient Information 2015 [employer]. This information is not intended to replace advice given to you by your health care provider. Make sure you discuss any questions you have with your health care provider.  

## 2015-04-27 ENCOUNTER — Encounter (HOSPITAL_COMMUNITY): Payer: Self-pay | Admitting: Emergency Medicine

## 2015-04-27 ENCOUNTER — Emergency Department (HOSPITAL_COMMUNITY)
Admission: EM | Admit: 2015-04-27 | Discharge: 2015-04-27 | Disposition: A | Discharge status: Home / Self Care | Payer: Medicaid Other | Attending: Emergency Medicine | Admitting: Emergency Medicine

## 2015-04-27 DIAGNOSIS — G8929 Other chronic pain: Secondary | ICD-10-CM | POA: Insufficient documentation

## 2015-04-27 DIAGNOSIS — M549 Dorsalgia, unspecified: Secondary | ICD-10-CM | POA: Insufficient documentation

## 2015-04-27 DIAGNOSIS — N939 Abnormal uterine and vaginal bleeding, unspecified: Secondary | ICD-10-CM | POA: Diagnosis not present

## 2015-04-27 DIAGNOSIS — N390 Urinary tract infection, site not specified: Secondary | ICD-10-CM

## 2015-04-27 DIAGNOSIS — Z720 Tobacco use: Secondary | ICD-10-CM | POA: Insufficient documentation

## 2015-04-27 DIAGNOSIS — N72 Inflammatory disease of cervix uteri: Secondary | ICD-10-CM | POA: Diagnosis not present

## 2015-04-27 DIAGNOSIS — R102 Pelvic and perineal pain: Secondary | ICD-10-CM | POA: Diagnosis present

## 2015-04-27 LAB — URINALYSIS, ROUTINE W REFLEX MICROSCOPIC
Glucose, UA: NEGATIVE mg/dL
Ketones, ur: 15 mg/dL — AB
Nitrite: POSITIVE — AB
Protein, ur: 100 mg/dL — AB
SPECIFIC GRAVITY, URINE: 1.019 (ref 1.005–1.030)
UROBILINOGEN UA: 1 mg/dL (ref 0.0–1.0)
pH: 5 (ref 5.0–8.0)

## 2015-04-27 LAB — CBC
HEMATOCRIT: 31.6 % — AB (ref 36.0–46.0)
Hemoglobin: 10 g/dL — ABNORMAL LOW (ref 12.0–15.0)
MCH: 22.5 pg — ABNORMAL LOW (ref 26.0–34.0)
MCHC: 31.6 g/dL (ref 30.0–36.0)
MCV: 71 fL — ABNORMAL LOW (ref 78.0–100.0)
PLATELETS: 391 10*3/uL (ref 150–400)
RBC: 4.45 MIL/uL (ref 3.87–5.11)
RDW: 19.6 % — AB (ref 11.5–15.5)
WBC: 7.2 10*3/uL (ref 4.0–10.5)

## 2015-04-27 LAB — COMPREHENSIVE METABOLIC PANEL
ALBUMIN: 4 g/dL (ref 3.5–5.0)
ALK PHOS: 58 U/L (ref 38–126)
ALT: 15 U/L (ref 14–54)
AST: 18 U/L (ref 15–41)
Anion gap: 5 (ref 5–15)
BILIRUBIN TOTAL: 0.3 mg/dL (ref 0.3–1.2)
CALCIUM: 9.2 mg/dL (ref 8.9–10.3)
CO2: 25 mmol/L (ref 22–32)
CREATININE: 0.71 mg/dL (ref 0.44–1.00)
Chloride: 109 mmol/L (ref 101–111)
GFR calc Af Amer: >60 mL/min (ref >60)
GFR calc non Af Amer: >60 mL/min (ref >60)
GLUCOSE: 94 mg/dL (ref 65–99)
POTASSIUM: 3.9 mmol/L (ref 3.5–5.1)
Sodium: 139 mmol/L (ref 135–145)
TOTAL PROTEIN: 7.6 g/dL (ref 6.5–8.1)

## 2015-04-27 LAB — URINE MICROSCOPIC-ADD ON

## 2015-04-27 LAB — I-STAT BETA HCG BLOOD, ED (MC, WL, AP ONLY): I-stat hCG, quantitative: <5 m[IU]/mL (ref <5)

## 2015-04-27 LAB — WET PREP, GENITAL
Trich, Wet Prep: NONE SEEN
WBC, Wet Prep HPF POC: NONE SEEN
YEAST WET PREP: NONE SEEN

## 2015-04-27 LAB — HIV ANTIBODY (ROUTINE TESTING W REFLEX): HIV SCREEN 4TH GENERATION: NONREACTIVE

## 2015-04-27 LAB — RPR: RPR: NONREACTIVE

## 2015-04-27 MED ORDER — CEPHALEXIN 250 MG PO CAPS
500.0000 mg | ORAL_CAPSULE | Freq: Once | ORAL | Status: AC
  Administered 2015-04-27: 500 mg via ORAL
  Filled 2015-04-27: qty 2

## 2015-04-27 MED ORDER — OXYCODONE-ACETAMINOPHEN 5-325 MG PO TABS
2.0000 | ORAL_TABLET | Freq: Once | ORAL | Status: AC
  Administered 2015-04-27: 2 via ORAL
  Filled 2015-04-27: qty 2

## 2015-04-27 MED ORDER — CEFTRIAXONE SODIUM 250 MG IJ SOLR
250.0000 mg | Freq: Once | INTRAMUSCULAR | Status: AC
  Administered 2015-04-27: 250 mg via INTRAMUSCULAR
  Filled 2015-04-27: qty 250

## 2015-04-27 MED ORDER — LIDOCAINE HCL (PF) 1 % IJ SOLN
1.0000 mL | Freq: Once | INTRAMUSCULAR | Status: AC
  Administered 2015-04-27: 1 mL

## 2015-04-27 MED ORDER — AZITHROMYCIN 250 MG PO TABS
1000.0000 mg | ORAL_TABLET | Freq: Once | ORAL | Status: AC
  Administered 2015-04-27: 1000 mg via ORAL
  Filled 2015-04-27: qty 4

## 2015-04-27 MED ORDER — CEPHALEXIN 500 MG PO CAPS: 500.0000 mg | ORAL_CAPSULE | Freq: Three times a day (TID) | ORAL | Status: DC

## 2015-04-27 MED ORDER — HYDROCODONE-ACETAMINOPHEN 5-325 MG PO TABS: 1.0000 | ORAL_TABLET | ORAL | Status: DC | PRN

## 2015-04-27 NOTE — ED Provider Notes (Signed)
CSN: [id number]     Arrival date & time 04/27/15  0256 History   First MD Initiated Contact with Patient 04/27/15 0316     Chief Complaint  Patient presents with  . Vaginal Bleeding  . Back Pain  . Pelvic Pain     (Consider location/radiation/quality/duration/timing/severity/associated sxs/prior Treatment) HPI Comments: Patient reports 3 weeks of intermittent vaginal bleeding.  She states her last intercourse was August.  He has not had intercourse with anyone since that time.  She states the bleeding is abnormal for her.  She'll bleed for 2-3 days that have a day of no bleeding.  Then some spotting which is light mucousy in nature.  She states she took a pregnancy test approximately 6 weeks ago that was inconclusive.  She denies any nausea, abdominal pain, bloating, breast tenderness.  Patient is a 24 y.o. female presenting with vaginal bleeding, back pain, and pelvic pain. The history is provided by the patient.  Vaginal Bleeding Quality:  Clots, bright red and spotting Severity:  Moderate Onset quality:  Gradual Duration:  3 weeks Timing:  Intermittent Progression:  Waxing and waning Chronicity:  New Menstrual history:  Regular Possible pregnancy: yes   Relieved by:  Nothing Worsened by:  Nothing tried Ineffective treatments:  None tried Associated symptoms: back pain   Associated symptoms: no abdominal pain, no dysuria and no fever   Back Pain Associated symptoms: pelvic pain   Associated symptoms: no abdominal pain, no dysuria and no fever   Pelvic Pain Pertinent negatives include no abdominal pain or fever.    Past Medical History  Diagnosis Date  . Blood transfusion     2010 s/p SVD  . GERD (gastroesophageal reflux disease)   . Mental disorder   . Preterm labor     first pregnancy/current pregnancy  . Anemia   . UTI (urinary tract infection) in pregnancy, antepartum     09/2011 tx with abx  . Depression     not on meds  . Pregnancy induced hypertension 2010     required Mag Sulfate  . Chronic back pain    Past Surgical History  Procedure Laterality Date  . Finger surgery      right thumb in childhood   Family History  Problem Relation Age of Onset  . Asthma Mother   . Cancer Mother     colon  . Diabetes Father   . Hypertension Father   . Mental illness Brother     bipolar, schizophrenia  . Anesthesia problems Neg Hx    Social History  Substance Use Topics  . Smoking status: Current Every Day Smoker -- 0.50 packs/day    Types: Cigarettes  . Smokeless tobacco: Never Used  . Alcohol Use: No     Comment: last alcohol in September   OB History    Gravida Para Term Preterm AB TAB SAB Ectopic Multiple Living   3 3 2 1  0 0 0 0 0 3     Review of Systems  Constitutional: Negative for fever.  Respiratory: Negative for shortness of breath.   Gastrointestinal: Negative for abdominal pain.  Genitourinary: Positive for vaginal bleeding and pelvic pain. Negative for dysuria and vaginal pain.  Musculoskeletal: Positive for back pain.  All other systems reviewed and are negative.     Allergies  Review of patient's allergies indicates no known allergies.  Home Medications   Prior to Admission medications   Medication Sig Start Date End Date Taking? Authorizing Provider  cephALEXin (KEFLEX) 500 MG  capsule Take 1 capsule (500 mg total) by mouth 3 (three) times daily. 04/27/15   Earley Favor, NP  HYDROcodone-acetaminophen (NORCO/VICODIN) 5-325 MG tablet Take 1 tablet by mouth every 4 (four) hours as needed. 04/27/15   Earley Favor, NP  ZOFRAN 4 MG tablet Take 1 tablet (4 mg total) by mouth every 8 (eight) hours as needed for nausea or vomiting. 04/18/14   Harolyn Rutherford Rasch, NP   BP 117/84 mmHg  Pulse 91  Temp(Src) 97.3 F (36.3 C) (Oral)  Resp 16  SpO2 100%  LMP  (LMP Unknown) Physical Exam  Constitutional: She appears well-developed and well-nourished.  HENT:  Head: Normocephalic.  Eyes: Pupils are equal, round, and reactive to  light.  Neck: Normal range of motion.  Cardiovascular: Normal rate and regular rhythm.   Pulmonary/Chest: Effort normal and breath sounds normal.  Abdominal: Soft. She exhibits no distension. There is no tenderness.  Genitourinary: Cervix exhibits motion tenderness. Cervix exhibits no discharge. Right adnexum displays tenderness. Left adnexum displays tenderness. No vaginal discharge found.  Musculoskeletal: Normal range of motion.  Neurological: She is alert.  Skin: Skin is warm and dry.  Vitals reviewed.   ED Course  Procedures (including critical care time) Labs Review Labs Reviewed  WET PREP, GENITAL - Abnormal; Notable for the following:    Clue Cells Wet Prep HPF POC FEW (*)    All other components within normal limits  COMPREHENSIVE METABOLIC PANEL - Abnormal; Notable for the following:    BUN <5 (*)    All other components within normal limits  CBC - Abnormal; Notable for the following:    Hemoglobin 10.0 (*)    HCT 31.6 (*)    MCV 71.0 (*)    MCH 22.5 (*)    RDW 19.6 (*)    All other components within normal limits  URINALYSIS, ROUTINE W REFLEX MICROSCOPIC (NOT AT [facility]) - Abnormal; Notable for the following:    Color, Urine RED (*)    APPearance TURBID (*)    Hgb urine dipstick LARGE (*)    Bilirubin Urine LARGE (*)    Ketones, ur 15 (*)    Protein, ur 100 (*)    Nitrite POSITIVE (*)    Leukocytes, UA LARGE (*)    All other components within normal limits  URINE MICROSCOPIC-ADD ON - Abnormal; Notable for the following:    Bacteria, UA MANY (*)    All other components within normal limits  RPR  HIV ANTIBODY (ROUTINE TESTING)  I-STAT BETA HCG BLOOD, ED ([facility], [facility], [facility] ONLY)  GC/CHLAMYDIA PROBE AMP ([place]) NOT AT [facility]    Imaging Review No results found. I have personally reviewed and evaluated these images and lab results as part of my medical decision-making.   EKG Interpretation None     Eye exam patient has cervicitis with cervical motion tenderness.   She's been treated for STD with Rocephin and azithromycin.  She also has a urinary tract infection and will be treated with Keflex.  She'll be discharged home with prescription for Keflex as well as Vicodin for pain.  She's been referred to [facility] to further address her abnormal vaginal bleeding MDM   Final diagnoses:  Abnormal uterine and vaginal bleeding, unspecified  Cervicitis  UTI (lower urinary tract infection)         Earley Favor, NP 04/27/15 1610  Loren Racer, MD 04/28/15 [phone]

## 2015-04-27 NOTE — ED Notes (Signed)
Patient here with complaint of pelvic and lower back pain coupled with constant vaginal bleeding for 3 weeks. Unsure if pregnant. States she took a home pregnancy test 6 weeks ago and saw "a faint line" but "didn't think that meant positive". States that cramps get worse at times and heavier bleeding follows pain. Denies nausea, vomiting, diarrhea. Endorses irregular periods and states no use of contraceptives.

## 2015-04-27 NOTE — ED Notes (Signed)
Pt states that her last menstrual period was August 30.

## 2015-04-27 NOTE — Discharge Instructions (Signed)
Abnormal Uterine Bleeding Abnormal uterine bleeding can affect women at various stages in life, including teenagers, women in their reproductive years, pregnant women, and women who have reached menopause. Several kinds of uterine bleeding are considered abnormal, including:  Bleeding or spotting between periods.   Bleeding after sexual intercourse.   Bleeding that is heavier or more than normal.   Periods that last longer than usual.  Bleeding after menopause.  Many cases of abnormal uterine bleeding are minor and simple to treat, while others are more serious. Any type of abnormal bleeding should be evaluated by your health care provider. Treatment will depend on the cause of the bleeding. HOME CARE INSTRUCTIONS Monitor your condition for any changes. The following actions may help to alleviate any discomfort you are experiencing:  Avoid the use of tampons and douches as directed by your health care provider.  Change your pads frequently. You should get regular pelvic exams and Pap tests. Keep all follow-up appointments for diagnostic tests as directed by your health care provider.  SEEK MEDICAL CARE IF:   Your bleeding lasts more than 1 week.   You feel dizzy at times.  SEEK IMMEDIATE MEDICAL CARE IF:   You pass out.   You are changing pads every 15 to 30 minutes.   You have abdominal pain.  You have a fever.   You become sweaty or weak.   You are passing large blood clots from the vagina.   You start to feel nauseous and vomit. MAKE SURE YOU:   Understand these instructions.  Will watch your condition.  Will get help right away if you are not doing well or get worse.   This information is not intended to replace advice given to you by your health care provider. Make sure you discuss any questions you have with your health care provider.   Document Released: 06/29/2005 Document Revised: 07/04/2013 Document Reviewed: 01/26/2013 Elsevier Interactive  Patient Education 2016 [employer] Please [facility] for further evaluation of your abnormal bleeding.  You also have a urinary tract infection number given a prescription for Keflex.  Please take all the tablets until completed.  You also have been treated for cervicitis and cultures are pending

## 2015-04-29 LAB — GC/CHLAMYDIA PROBE AMP (~~LOC~~) NOT AT ARMC
CHLAMYDIA, DNA PROBE: POSITIVE — AB
NEISSERIA GONORRHEA: NEGATIVE

## 2015-04-30 ENCOUNTER — Telehealth (HOSPITAL_COMMUNITY): Payer: Self-pay

## 2015-04-30 NOTE — Telephone Encounter (Signed)
Positive for chlamydia. Treated per protocol. [employer] form faxed. Attempting to contact.  

## 2015-05-01 ENCOUNTER — Telehealth (HOSPITAL_BASED_OUTPATIENT_CLINIC_OR_DEPARTMENT_OTHER): Payer: Self-pay | Admitting: Emergency Medicine

## 2015-05-02 ENCOUNTER — Telehealth (HOSPITAL_BASED_OUTPATIENT_CLINIC_OR_DEPARTMENT_OTHER): Payer: Self-pay | Admitting: Emergency Medicine

## 2015-05-18 ENCOUNTER — Telehealth (HOSPITAL_COMMUNITY): Payer: Self-pay

## 2015-05-18 NOTE — Telephone Encounter (Signed)
Unable to contact pt by mail or telephone. Unable to communicate lab results or treatment changes. 

## 2015-11-05 ENCOUNTER — Emergency Department (HOSPITAL_COMMUNITY)
Admission: EM | Admit: 2015-11-05 | Discharge: 2015-11-05 | Discharge status: Absent without leave | Payer: Medicaid Other

## 2015-11-05 ENCOUNTER — Encounter (HOSPITAL_COMMUNITY): Payer: Self-pay | Admitting: Emergency Medicine

## 2015-11-05 ENCOUNTER — Emergency Department (HOSPITAL_COMMUNITY)
Admission: EM | Admit: 2015-11-05 | Discharge: 2015-11-05 | Discharge status: Against Medical Advice | Payer: Medicaid Other | Attending: Emergency Medicine | Admitting: Emergency Medicine

## 2015-11-05 ENCOUNTER — Ambulatory Visit (HOSPITAL_COMMUNITY)
Admission: EM | Admit: 2015-11-05 | Discharge: 2015-11-05 | Disposition: A | Discharge status: Nursing Home (Includes ICF) | Payer: Medicaid Other | Attending: Emergency Medicine | Admitting: Emergency Medicine

## 2015-11-05 DIAGNOSIS — R51 Headache: Secondary | ICD-10-CM | POA: Insufficient documentation

## 2015-11-05 DIAGNOSIS — R519 Headache, unspecified: Secondary | ICD-10-CM

## 2015-11-05 DIAGNOSIS — Z8719 Personal history of other diseases of the digestive system: Secondary | ICD-10-CM | POA: Diagnosis not present

## 2015-11-05 DIAGNOSIS — M545 Low back pain, unspecified: Secondary | ICD-10-CM

## 2015-11-05 DIAGNOSIS — M546 Pain in thoracic spine: Secondary | ICD-10-CM | POA: Insufficient documentation

## 2015-11-05 DIAGNOSIS — R42 Dizziness and giddiness: Secondary | ICD-10-CM | POA: Diagnosis not present

## 2015-11-05 DIAGNOSIS — R569 Unspecified convulsions: Secondary | ICD-10-CM | POA: Diagnosis not present

## 2015-11-05 DIAGNOSIS — R55 Syncope and collapse: Secondary | ICD-10-CM | POA: Diagnosis not present

## 2015-11-05 DIAGNOSIS — G8929 Other chronic pain: Secondary | ICD-10-CM | POA: Diagnosis not present

## 2015-11-05 DIAGNOSIS — Z862 Personal history of diseases of the blood and blood-forming organs and certain disorders involving the immune mechanism: Secondary | ICD-10-CM | POA: Insufficient documentation

## 2015-11-05 DIAGNOSIS — R1110 Vomiting, unspecified: Secondary | ICD-10-CM | POA: Diagnosis not present

## 2015-11-05 DIAGNOSIS — F17210 Nicotine dependence, cigarettes, uncomplicated: Secondary | ICD-10-CM | POA: Diagnosis not present

## 2015-11-05 DIAGNOSIS — Z8659 Personal history of other mental and behavioral disorders: Secondary | ICD-10-CM | POA: Insufficient documentation

## 2015-11-05 DIAGNOSIS — Z87440 Personal history of urinary (tract) infections: Secondary | ICD-10-CM | POA: Insufficient documentation

## 2015-11-05 LAB — CBC
HEMATOCRIT: 33.7 % — AB (ref 36.0–46.0)
HEMOGLOBIN: 10.6 g/dL — AB (ref 12.0–15.0)
MCH: 22.1 pg — AB (ref 26.0–34.0)
MCHC: 31.5 g/dL (ref 30.0–36.0)
MCV: 70.4 fL — ABNORMAL LOW (ref 78.0–100.0)
Platelets: 336 10*3/uL (ref 150–400)
RBC: 4.79 MIL/uL (ref 3.87–5.11)
RDW: 18.2 % — ABNORMAL HIGH (ref 11.5–15.5)
WBC: 5.1 10*3/uL (ref 4.0–10.5)

## 2015-11-05 LAB — POCT URINALYSIS DIP (DEVICE)
Glucose, UA: NEGATIVE mg/dL
HGB URINE DIPSTICK: NEGATIVE
Ketones, ur: NEGATIVE mg/dL
Leukocytes, UA: NEGATIVE
NITRITE: NEGATIVE
PH: 6 (ref 5.0–8.0)
PROTEIN: 30 mg/dL — AB
Specific Gravity, Urine: >=1.03 (ref 1.005–1.030)
Urobilinogen, UA: 1 mg/dL (ref 0.0–1.0)

## 2015-11-05 LAB — BASIC METABOLIC PANEL
Anion gap: 9 (ref 5–15)
BUN: 7 mg/dL (ref 6–20)
CALCIUM: 9.1 mg/dL (ref 8.9–10.3)
CO2: 21 mmol/L — ABNORMAL LOW (ref 22–32)
CREATININE: 0.85 mg/dL (ref 0.44–1.00)
Chloride: 107 mmol/L (ref 101–111)
GFR calc non Af Amer: >60 mL/min (ref >60)
Glucose, Bld: 90 mg/dL (ref 65–99)
Potassium: 4 mmol/L (ref 3.5–5.1)
SODIUM: 137 mmol/L (ref 135–145)

## 2015-11-05 LAB — I-STAT BETA HCG BLOOD, ED (MC, WL, AP ONLY): I-stat hCG, quantitative: <5 m[IU]/mL (ref <5)

## 2015-11-05 LAB — CBG MONITORING, ED: Glucose-Capillary: 89 mg/dL (ref 65–99)

## 2015-11-05 MED ORDER — SODIUM CHLORIDE 0.9 % IV BOLUS (SEPSIS)
1000.0000 mL | Freq: Once | INTRAVENOUS | Status: AC
  Administered 2015-11-05: 1000 mL via INTRAVENOUS

## 2015-11-05 MED ORDER — SODIUM CHLORIDE 0.9 % IV SOLN
Freq: Once | INTRAVENOUS | Status: AC
  Administered 2015-11-05: 21:00:00 via INTRAVENOUS

## 2015-11-05 MED ORDER — METOCLOPRAMIDE HCL 5 MG/ML IJ SOLN
10.0000 mg | Freq: Once | INTRAMUSCULAR | Status: AC
  Administered 2015-11-05: 10 mg via INTRAVENOUS
  Filled 2015-11-05: qty 2

## 2015-11-05 MED ORDER — DIPHENHYDRAMINE HCL 50 MG/ML IJ SOLN
25.0000 mg | Freq: Once | INTRAMUSCULAR | Status: AC
  Administered 2015-11-05: 25 mg via INTRAVENOUS
  Filled 2015-11-05: qty 1

## 2015-11-05 NOTE — Discharge Instructions (Signed)
You are leaving AGAINST MEDICAL ADVICE. We offered you treatments including medication for pain as well as x-rays and CT scans to evaluate what is causing her symptoms. You have verbalized understanding that leaving prior to this could cause us to miss potentially serious diagnoses such as bleeding on the brain, other serious causes of headache, fractures in your back. He understands that leaving could lead to permanent injury or disability or even death. Please return to the ER if any of your symptoms worsen or he decides he would like to seek treatment again.

## 2015-11-05 NOTE — ED Provider Notes (Signed)
CSN: [id number]     Arrival date & time 11/05/15  1915 History   First MD Initiated Contact with Patient 11/05/15 2006     Chief Complaint  Patient presents with  . Back Pain   (Consider location/radiation/quality/duration/timing/severity/associated sxs/prior Treatment) HPI  She is a 25 year old woman here for evaluation of headache and back pain. She states yesterday around 6:00 she developed a headache. She went home had something to eat and laid down. She also took Tylenol and ibuprofen. Around 10:00 she had not had any improvement in the headache. At that time she walked down the hall to use the bathroom. She states once she got to the bathroom she had black spots in her vision in her vision went dark, her body felt very heavy, and she passed out. When this happened she did fall down some stairs. Her boyfriend witnessed this event, and told her that she was shaking and sweating profusely when she was out. She states she was confused when she woke up and had had urinary and bowel incontinence.  She reports vomiting several times when she came to. She denies any recurring episodes of syncope. She has had some additional vomiting today. She has had continued headache. She denies any focal weakness, but states she has been falling recently and always falls to the left.  No history of seizures. She is not currently taking any medications.  Past Medical History  Diagnosis Date  . Blood transfusion     2010 s/p SVD  . GERD (gastroesophageal reflux disease)   . Mental disorder   . Preterm labor     first pregnancy/current pregnancy  . Anemia   . UTI (urinary tract infection) in pregnancy, antepartum     09/2011 tx with abx  . Depression     not on meds  . Pregnancy induced hypertension 2010    required Mag Sulfate  . Chronic back pain    Past Surgical History  Procedure Laterality Date  . Finger surgery      right thumb in childhood   Family History  Problem Relation Age of Onset  .  Asthma Mother   . Cancer Mother     colon  . Diabetes Father   . Hypertension Father   . Mental illness Brother     bipolar, schizophrenia  . Anesthesia problems Neg Hx    Social History  Substance Use Topics  . Smoking status: Current Every Day Smoker -- 0.50 packs/day    Types: Cigarettes  . Smokeless tobacco: Never Used  . Alcohol Use: No     Comment: last alcohol in September   OB History    Gravida Para Term Preterm AB TAB SAB Ectopic Multiple Living   3 3 2 1  0 0 0 0 0 3     Review of Systems As in history of present illness Allergies  Review of patient's allergies indicates no known allergies.  Home Medications   Prior to Admission medications   Medication Sig Start Date End Date Taking? Authorizing Provider  acetaminophen (TYLENOL) 325 MG tablet Take 650 mg by mouth every 6 (six) hours as needed.   Yes Historical Provider, MD  ibuprofen (ADVIL,MOTRIN) 200 MG tablet Take 200 mg by mouth every 6 (six) hours as needed.   Yes Historical Provider, MD  cephALEXin (KEFLEX) 500 MG capsule Take 1 capsule (500 mg total) by mouth 3 (three) times daily. Patient not taking: Reported on 11/05/2015 04/27/15   Earley FavorGail Schulz, NP  HYDROcodone-acetaminophen (NORCO/VICODIN) [phone]  MG tablet Take 1 tablet by mouth every 4 (four) hours as needed. Patient not taking: Reported on 11/05/2015 04/27/15   Earley Favor, NP  ZOFRAN 4 MG tablet Take 1 tablet (4 mg total) by mouth every 8 (eight) hours as needed for nausea or vomiting. Patient not taking: Reported on 11/05/2015 04/18/14   Duane Lope, NP   Meds Ordered and Administered this Visit  Medications - No data to display  BP 125/80 mmHg  Pulse 88  Temp(Src) 98.6 F (37 C) (Oral)  Resp 16  SpO2 100%  LMP 10/17/2015 No data found.   Physical Exam  Constitutional: She appears well-developed and well-nourished. She appears distressed (looks uncomfortable).  Eyes: Conjunctivae and EOM are normal. Pupils are equal, round, and reactive  to light.  Neck: Neck supple.  Cardiovascular: Normal rate, regular rhythm and normal heart sounds.   No murmur heard. Pulmonary/Chest: Effort normal and breath sounds normal. No respiratory distress. She has no wheezes. She has no rales.  Musculoskeletal:  Back: No erythema or edema. She is diffusely tender to light palpation across the lumbar back.  Neurological: No cranial nerve deficit. She exhibits normal muscle tone. Coordination normal.    ED Course  Procedures (including critical care time)  Labs Review Labs Reviewed  POCT URINALYSIS DIP (DEVICE) - Abnormal; Notable for the following:    Bilirubin Urine SMALL (*)    Protein, ur 30 (*)    All other components within normal limits    Imaging Review No results found.   MDM   1. Seizure-like activity (HCC)   2. Bilateral low back pain without sciatica   3. Headache, unspecified headache type    Patient's history is concerning for new seizure last night. Given history of headache unresponsive to OTC medications and falling to the left, I'm concerned for intracranial pathology. We'll start an IV and give fluids here. Transfer to [facility] ED via EMS for evaluation of seizure-like activity.    Charm Rings, MD 11/05/15 2035

## 2015-11-05 NOTE — ED Provider Notes (Signed)
CSN: [id number]     Arrival date & time 11/05/15  2101 History   First MD Initiated Contact with Patient 11/05/15 2131     Chief Complaint  Patient presents with  . Dizziness  . Urinary Incontinence  . Encopresis  . Seizures     (Consider location/radiation/quality/duration/timing/severity/associated sxs/prior Treatment) HPI  25 year old female presents for the evaluation of headache and dizziness. Sent from urgent care. Patient states that yesterday she developed a mild headache in the afternoon that got worse at 6 PM. Gradually worsening throughout the night. Shortly after the headache got worse she became dizzy with a room spinning sensation while in the bathroom. She tried to walk down the stairs and her boyfriend states that she fell down over 10 stairs. She was passed out for an unknown amount of time. When she woke up she vomited and while vomiting she urinated and defecated on her self. Unclear she had seizure-like activity. The boyfriend told her that she was "shaking". Patient did not seek care last night. Today she woke up with a mild headache that has gradually worsened throughout the day. Patient's headache is frontal. There is no current dizziness, weakness, or numbness. No chest pain or shortness breath. No current vomiting or diarrhea. Patient is having low and mid back pain since the fall last night.   Past Medical History  Diagnosis Date  . Blood transfusion     2010 s/p SVD  . GERD (gastroesophageal reflux disease)   . Mental disorder   . Preterm labor     first pregnancy/current pregnancy  . Anemia   . UTI (urinary tract infection) in pregnancy, antepartum     09/2011 tx with abx  . Depression     not on meds  . Pregnancy induced hypertension 2010    required Mag Sulfate  . Chronic back pain    Past Surgical History  Procedure Laterality Date  . Finger surgery      right thumb in childhood   Family History  Problem Relation Age of Onset  . Asthma Mother    . Cancer Mother     colon  . Diabetes Father   . Hypertension Father   . Mental illness Brother     bipolar, schizophrenia  . Anesthesia problems Neg Hx    Social History  Substance Use Topics  . Smoking status: Current Every Day Smoker -- 0.50 packs/day    Types: Cigarettes  . Smokeless tobacco: Never Used  . Alcohol Use: No     Comment: last alcohol in September   OB History    Gravida Para Term Preterm AB TAB SAB Ectopic Multiple Living   0 0 0 0 0 3     Review of Systems  Constitutional: Negative for fever.  Eyes: Negative for visual disturbance.  Respiratory: Negative for shortness of breath.   Cardiovascular: Negative for chest pain.  Gastrointestinal: Positive for vomiting. Negative for abdominal pain.  Musculoskeletal: Positive for back pain. Negative for neck pain and neck stiffness.  Neurological: Positive for dizziness, syncope and headaches. Negative for weakness and numbness.  All other systems reviewed and are negative.     Allergies  Review of patient's allergies indicates no known allergies.  Home Medications   Prior to Admission medications   Medication Sig Start Date End Date Taking? Authorizing Provider  acetaminophen (TYLENOL) 325 MG tablet Take 650 mg by mouth every 6 (six) hours as needed.    Historical Provider, MD  cephALEXin (  KEFLEX) 500 MG capsule Take 1 capsule (500 mg total) by mouth 3 (three) times daily. Patient not taking: Reported on 11/05/2015 04/27/15   Earley FavorGail Schulz, NP  HYDROcodone-acetaminophen (NORCO/VICODIN) 5-325 MG tablet Take 1 tablet by mouth every 4 (four) hours as needed. Patient not taking: Reported on 11/05/2015 04/27/15   Earley FavorGail Schulz, NP  ibuprofen (ADVIL,MOTRIN) 200 MG tablet Take 200 mg by mouth every 6 (six) hours as needed.    Historical Provider, MD  ZOFRAN 4 MG tablet Take 1 tablet (4 mg total) by mouth every 8 (eight) hours as needed for nausea or vomiting. Patient not taking: Reported on 11/05/2015 04/18/14    Duane LopeJennifer I Rasch, NP   BP 156/81 mmHg  Pulse 87  Temp(Src) 98.1 F (36.7 C) (Oral)  Resp 15  Ht 5\' 8"  (1.727 m)  Wt 154 lb (69.854 kg)  BMI 23.42 kg/m2  SpO2 100%  LMP 10/17/2015 Physical Exam  Constitutional: She is oriented to person, place, and time. She appears well-developed and well-nourished.  HENT:  Head: Normocephalic and atraumatic.  Right Ear: External ear normal.  Left Ear: External ear normal.  Nose: Nose normal.  Eyes: EOM are normal. Pupils are equal, round, and reactive to light. Right eye exhibits no discharge. Left eye exhibits no discharge.  Neck: Normal range of motion. Neck supple. No spinous process tenderness and no muscular tenderness present.  No meningismus  Cardiovascular: Normal rate, regular rhythm and normal heart sounds.   No murmur heard. Pulmonary/Chest: Effort normal and breath sounds normal. She has no wheezes. She has no rales.  Abdominal: Soft. She exhibits no distension. There is no tenderness.  Musculoskeletal:       Cervical back: She exhibits normal range of motion and no tenderness.       Thoracic back: She exhibits tenderness and bony tenderness.       Lumbar back: She exhibits tenderness and bony tenderness.  Diffuse tenderness to light touch in mid-thoracic down to lumbar back  Neurological: She is alert and oriented to person, place, and time.  CN 2-12 grossly intact. 5/5 strength in all 4 extremities. Grossly normal sensation. Normal finger to nose  Skin: Skin is warm and dry.  Nursing note and vitals reviewed.   ED Course  Procedures (including critical care time) Labs Review Labs Reviewed  BASIC METABOLIC PANEL - Abnormal; Notable for the following:    CO2 21 (*)    All other components within normal limits  CBC - Abnormal; Notable for the following:    Hemoglobin 10.6 (*)    HCT 33.7 (*)    MCV 70.4 (*)    MCH 22.1 (*)    RDW 18.2 (*)    All other components within normal limits  CBG MONITORING, ED  I-STAT BETA HCG  BLOOD, ED ([facility], [facility], [facility] ONLY)    Imaging Review No results found. I have personally reviewed and evaluated these images and lab results as part of my medical decision-making.   EKG Interpretation   Date/Time:  Tuesday November 05 2015 21:06:27 EDT Ventricular Rate:  84 PR Interval:  170 QRS Duration: 100 QT Interval:  357 QTC Calculation: 422 R Axis:   70 Text Interpretation:  Sinus rhythm Probable left atrial enlargement no  significant change since June 2016 Confirmed by Criss AlvineGOLDSTON  MD, Tamika Shropshire ([id number])  on 11/05/2015 9:32:59 PM      MDM   Final diagnoses:  Frontal headache  Syncope, unspecified syncope type  Low back pain, unspecified back pain laterality,  with sciatica presence unspecified    Patient with benign exam. Has diffuse back tenderness that is difficult to localize due to jumping with minimal palpation but no concerning neuro findings. No neck pain, stiffness or meningismus. Frontal headache is gradual in onset and waxing/waning. Patient states she fell down 14 stairs but no obvious signs of trauma. Planned head CT, imaging of back and treatment of headache/back pain. Low suspicion of SAH, meningitis or significant head trauma. While in ED during her workup, she all of a sudden started screaming very loudly. When I went to check on patient with nurse she was thrashing on bed screaming for [place] to take the leads and IV off of her. She states she feels hot and needs to leave. I asked her if this is similar to what happened last night and she says yes. She will not stay for evaluation/treatment despite my asking. She appears to have medical capacity. She is alert, awake and oriented. Verbalized understanding of possible missed diagnoses and harms of not finding fractures, bleeds, etc. She proceeded to get all the leads off and change into her clothes and walk out. She left AMA. She did appear to have normal gait on her way out of the ER.    Pricilla Loveless, MD 11/06/15 [phone]

## 2015-11-05 NOTE — ED Notes (Signed)
Reports back and bilateral side pain.  Reports cold sweats.  Symptoms started yesterday.  Patient reports syncopal episode.  Patient has a headache.  Patient has had vomiting and diarrhea last night.  Reports 2 episodes of vomiting this morning.  No diarrhea today.  denies abdominal pain.

## 2015-11-05 NOTE — ED Notes (Signed)
Pt left AMA. This RN over heard the pt screaming and yelling from another room. This RN responded with a second nurse and the MD> PT continued to scream yelling that she was hot and that she want to go home. Pt ripped off cardiac monitoring equipment and attempted to rip out her IV. Pt stated she wanted to go to the bathroom. Pt was informed that she would need to either get dressed or put a gown back on to go to the bathroom. Pt got self dressed and left AMA.
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
## 2015-11-05 NOTE — ED Notes (Signed)
No [employer] truck available 

## 2015-11-05 NOTE — ED Notes (Signed)
Notified [facility] 

## 2016-12-21 ENCOUNTER — Encounter (HOSPITAL_COMMUNITY): Payer: Self-pay

## 2016-12-21 ENCOUNTER — Emergency Department (HOSPITAL_COMMUNITY)
Admission: EM | Admit: 2016-12-21 | Discharge: 2016-12-21 | Disposition: A | Discharge status: Home / Self Care | Payer: No Typology Code available for payment source | Attending: Emergency Medicine | Admitting: Emergency Medicine

## 2016-12-21 DIAGNOSIS — S8990XA Unspecified injury of unspecified lower leg, initial encounter: Secondary | ICD-10-CM | POA: Diagnosis present

## 2016-12-21 DIAGNOSIS — S3992XA Unspecified injury of lower back, initial encounter: Secondary | ICD-10-CM | POA: Diagnosis not present

## 2016-12-21 DIAGNOSIS — M791 Myalgia: Secondary | ICD-10-CM | POA: Insufficient documentation

## 2016-12-21 DIAGNOSIS — Z79899 Other long term (current) drug therapy: Secondary | ICD-10-CM | POA: Insufficient documentation

## 2016-12-21 DIAGNOSIS — F17210 Nicotine dependence, cigarettes, uncomplicated: Secondary | ICD-10-CM | POA: Diagnosis not present

## 2016-12-21 DIAGNOSIS — Y929 Unspecified place or not applicable: Secondary | ICD-10-CM | POA: Diagnosis not present

## 2016-12-21 DIAGNOSIS — S199XXA Unspecified injury of neck, initial encounter: Secondary | ICD-10-CM | POA: Insufficient documentation

## 2016-12-21 DIAGNOSIS — D649 Anemia, unspecified: Secondary | ICD-10-CM | POA: Diagnosis not present

## 2016-12-21 DIAGNOSIS — Y999 Unspecified external cause status: Secondary | ICD-10-CM | POA: Diagnosis not present

## 2016-12-21 DIAGNOSIS — M7918 Myalgia, other site: Secondary | ICD-10-CM

## 2016-12-21 DIAGNOSIS — Y939 Activity, unspecified: Secondary | ICD-10-CM | POA: Diagnosis not present

## 2016-12-21 MED ORDER — IBUPROFEN 200 MG PO TABS
600.0000 mg | ORAL_TABLET | Freq: Once | ORAL | Status: AC
  Administered 2016-12-21: 600 mg via ORAL
  Filled 2016-12-21: qty 3

## 2016-12-21 MED ORDER — IBUPROFEN 800 MG PO TABS: 800.0000 mg | ORAL_TABLET | Freq: Three times a day (TID) | ORAL | 0 refills | Status: DC | PRN
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
## 2016-12-21 NOTE — Discharge Instructions (Signed)
Read the information below.  Use the prescribed medication as directed.  Please discuss all new medications with your pharmacist.  You may return to the Emergency Department at any time for worsening condition or any new symptoms that concern you.    If you develop fevers, loss of control of bowel or bladder, weakness or numbness in your legs, or are unable to walk, return to the ER for a recheck.  °

## 2017-02-09 ENCOUNTER — Encounter (HOSPITAL_COMMUNITY): Payer: Self-pay

## 2017-02-09 ENCOUNTER — Emergency Department (HOSPITAL_COMMUNITY): Payer: Medicaid Other

## 2017-02-09 ENCOUNTER — Emergency Department (HOSPITAL_COMMUNITY)
Admission: EM | Admit: 2017-02-09 | Discharge: 2017-02-09 | Disposition: A | Discharge status: Home / Self Care | Payer: Medicaid Other | Attending: Emergency Medicine | Admitting: Emergency Medicine

## 2017-02-09 DIAGNOSIS — Y999 Unspecified external cause status: Secondary | ICD-10-CM | POA: Insufficient documentation

## 2017-02-09 DIAGNOSIS — F17210 Nicotine dependence, cigarettes, uncomplicated: Secondary | ICD-10-CM | POA: Insufficient documentation

## 2017-02-09 DIAGNOSIS — Y929 Unspecified place or not applicable: Secondary | ICD-10-CM | POA: Insufficient documentation

## 2017-02-09 DIAGNOSIS — S098XXA Other specified injuries of head, initial encounter: Secondary | ICD-10-CM | POA: Diagnosis present

## 2017-02-09 DIAGNOSIS — W228XXA Striking against or struck by other objects, initial encounter: Secondary | ICD-10-CM | POA: Diagnosis not present

## 2017-02-09 DIAGNOSIS — Y939 Activity, unspecified: Secondary | ICD-10-CM | POA: Insufficient documentation

## 2017-02-09 DIAGNOSIS — S0990XA Unspecified injury of head, initial encounter: Secondary | ICD-10-CM

## 2017-02-09 DIAGNOSIS — S0181XA Laceration without foreign body of other part of head, initial encounter: Secondary | ICD-10-CM | POA: Insufficient documentation

## 2017-02-09 MED ORDER — IBUPROFEN 600 MG PO TABS: 600.0000 mg | ORAL_TABLET | Freq: Four times a day (QID) | ORAL | 0 refills | Status: DC | PRN

## 2017-02-09 MED ORDER — IBUPROFEN 400 MG PO TABS
600.0000 mg | ORAL_TABLET | Freq: Once | ORAL | Status: AC
  Administered 2017-02-09: 600 mg via ORAL
  Filled 2017-02-09: qty 1

## 2017-02-09 MED ORDER — ACETAMINOPHEN 500 MG PO TABS: 500.0000 mg | ORAL_TABLET | Freq: Four times a day (QID) | ORAL | 0 refills | Status: DC | PRN

## 2017-02-09 NOTE — Progress Notes (Signed)
Orthopedic Tech Progress Note Patient Details:  Jeanette KendallDeanna M Sanchez 09-04-1990 [id number]  Ortho Devices Type of Ortho Device: Thumb velcro splint Ortho Device/Splint Location: LUE Ortho Device/Splint Interventions: Ordered, Application   Jennye MoccasinHughes, [name] 02/09/2017, 5:29 PM
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
## 2017-02-09 NOTE — Discharge Instructions (Signed)
Treatment: Keep your wound dry and dressing applied until this time tomorrow. After 24 hours, you may wash with warm soapy water. Do not ever submerge your wound in water while the Dermabond is still attached. Regarding your headache, you can take ibuprofen or Tylenol. You may have a concussion. It is best to avoid looking at screen such as your phone, tablet, computer and focusing on reading. Limit this to about 15-20 minutes per day to help with headaches. Regarding your hand, wear your splint at all times except when bathing. Please follow-up with the hand doctor in 1-2 weeks for re-x-ray to assess for missed scaphoid fracture.  Follow-up: Please follow-up with your primary care provider in 2-3 days for wound recheck. Be aware of signs of infection: fever, increasing pain, redness, swelling, drainage from the area. Please call your primary care provider or return to emergency department if you develop any of these symptoms or if the wound reopens. Please return to the emergency department if you develop any other new or worsening symptoms.

## 2017-02-09 NOTE — ED Notes (Signed)
ED Provider at bedside. 

## 2017-02-09 NOTE — ED Notes (Signed)
Suture cart set up at bedside  

## 2017-02-09 NOTE — ED Notes (Signed)
Ortho tech at bedside 

## 2017-02-09 NOTE — ED Notes (Signed)
EDP at bedside  
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
## 2017-02-09 NOTE — ED Notes (Signed)
Patient transported to CT 

## 2017-02-09 NOTE — ED Notes (Signed)
Patient transported to X-ray 

## 2018-05-04 ENCOUNTER — Other Ambulatory Visit: Payer: Self-pay

## 2018-05-04 ENCOUNTER — Emergency Department (HOSPITAL_COMMUNITY)
Admission: EM | Admit: 2018-05-04 | Discharge: 2018-05-04 | Disposition: A | Discharge status: Home / Self Care | Payer: Medicaid Other | Attending: Emergency Medicine | Admitting: Emergency Medicine

## 2018-05-04 ENCOUNTER — Encounter (HOSPITAL_COMMUNITY): Payer: Self-pay

## 2018-05-04 DIAGNOSIS — K0889 Other specified disorders of teeth and supporting structures: Secondary | ICD-10-CM | POA: Diagnosis not present

## 2018-05-04 DIAGNOSIS — Z5321 Procedure and treatment not carried out due to patient leaving prior to being seen by health care provider: Secondary | ICD-10-CM | POA: Diagnosis not present

## 2018-05-04 NOTE — ED Notes (Signed)
Unable to locate pt  

## 2018-05-04 NOTE — ED Notes (Addendum)
Pt ambulated independently to bathroom.

## 2018-05-04 NOTE — ED Triage Notes (Signed)
Pt c/o dental pain X4-5 days states she has taken a lot of different OTC meds with no change in pain.

## 2018-05-04 NOTE — ED Notes (Signed)
Checked for patient again, still unable to locate patient

## 2018-08-03 ENCOUNTER — Other Ambulatory Visit: Payer: Self-pay

## 2018-08-03 ENCOUNTER — Encounter (HOSPITAL_COMMUNITY): Payer: Self-pay | Admitting: Emergency Medicine

## 2018-08-03 ENCOUNTER — Emergency Department (HOSPITAL_COMMUNITY)
Admission: EM | Admit: 2018-08-03 | Discharge: 2018-08-03 | Disposition: A | Discharge status: Home / Self Care | Payer: Medicaid Other | Attending: Emergency Medicine | Admitting: Emergency Medicine

## 2018-08-03 DIAGNOSIS — Z5321 Procedure and treatment not carried out due to patient leaving prior to being seen by health care provider: Secondary | ICD-10-CM | POA: Diagnosis not present

## 2018-08-03 DIAGNOSIS — M542 Cervicalgia: Secondary | ICD-10-CM | POA: Insufficient documentation
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
## 2018-08-04 ENCOUNTER — Other Ambulatory Visit: Payer: Self-pay

## 2018-08-04 ENCOUNTER — Encounter (HOSPITAL_COMMUNITY): Payer: Self-pay

## 2018-08-04 ENCOUNTER — Emergency Department (HOSPITAL_COMMUNITY)
Admission: EM | Admit: 2018-08-04 | Discharge: 2018-08-04 | Disposition: A | Discharge status: Home / Self Care | Payer: Medicaid Other | Attending: Emergency Medicine | Admitting: Emergency Medicine

## 2018-08-04 DIAGNOSIS — Y92410 Unspecified street and highway as the place of occurrence of the external cause: Secondary | ICD-10-CM | POA: Diagnosis not present

## 2018-08-04 DIAGNOSIS — F17210 Nicotine dependence, cigarettes, uncomplicated: Secondary | ICD-10-CM | POA: Insufficient documentation

## 2018-08-04 DIAGNOSIS — Y939 Activity, unspecified: Secondary | ICD-10-CM | POA: Diagnosis not present

## 2018-08-04 DIAGNOSIS — M7918 Myalgia, other site: Secondary | ICD-10-CM | POA: Diagnosis present

## 2018-08-04 DIAGNOSIS — Y999 Unspecified external cause status: Secondary | ICD-10-CM | POA: Insufficient documentation

## 2018-08-04 DIAGNOSIS — Z79899 Other long term (current) drug therapy: Secondary | ICD-10-CM | POA: Insufficient documentation

## 2018-08-04 LAB — POC URINE PREG, ED: Preg Test, Ur: NEGATIVE

## 2018-08-04 MED ORDER — CYCLOBENZAPRINE HCL 10 MG PO TABS: 10.0000 mg | ORAL_TABLET | Freq: Two times a day (BID) | ORAL | 0 refills | Status: DC | PRN
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
## 2018-08-04 NOTE — Discharge Instructions (Addendum)
Please read attached information. If you experience any new or worsening signs or symptoms please return to the emergency room for evaluation. Please follow-up with your primary care provider or specialist as discussed. Please use medication prescribed only as directed and discontinue taking if you have any concerning signs or symptoms.   °

## 2018-08-04 NOTE — ED Notes (Signed)
Patient verbalizes understanding of discharge instructions. Opportunity for questioning and answers were provided. Armband removed by staff, pt discharged from ED ambulatory.   
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
## 2018-12-10 ENCOUNTER — Other Ambulatory Visit: Payer: Self-pay

## 2018-12-10 ENCOUNTER — Emergency Department (HOSPITAL_COMMUNITY): Payer: Medicaid Other

## 2018-12-10 ENCOUNTER — Encounter (HOSPITAL_COMMUNITY): Payer: Self-pay | Admitting: Emergency Medicine

## 2018-12-10 ENCOUNTER — Emergency Department (HOSPITAL_COMMUNITY)
Admission: EM | Admit: 2018-12-10 | Discharge: 2018-12-10 | Disposition: A | Discharge status: Home / Self Care | Payer: Medicaid Other | Attending: Emergency Medicine | Admitting: Emergency Medicine

## 2018-12-10 DIAGNOSIS — Z79899 Other long term (current) drug therapy: Secondary | ICD-10-CM | POA: Insufficient documentation

## 2018-12-10 DIAGNOSIS — Y9389 Activity, other specified: Secondary | ICD-10-CM | POA: Insufficient documentation

## 2018-12-10 DIAGNOSIS — Y92099 Unspecified place in other non-institutional residence as the place of occurrence of the external cause: Secondary | ICD-10-CM | POA: Insufficient documentation

## 2018-12-10 DIAGNOSIS — S43005A Unspecified dislocation of left shoulder joint, initial encounter: Secondary | ICD-10-CM | POA: Insufficient documentation

## 2018-12-10 DIAGNOSIS — F17210 Nicotine dependence, cigarettes, uncomplicated: Secondary | ICD-10-CM | POA: Insufficient documentation

## 2018-12-10 DIAGNOSIS — X500XXA Overexertion from strenuous movement or load, initial encounter: Secondary | ICD-10-CM | POA: Diagnosis not present

## 2018-12-10 DIAGNOSIS — Y999 Unspecified external cause status: Secondary | ICD-10-CM | POA: Insufficient documentation

## 2018-12-10 DIAGNOSIS — S4982XA Other specified injuries of left shoulder and upper arm, initial encounter: Secondary | ICD-10-CM | POA: Diagnosis present

## 2018-12-10 LAB — I-STAT BETA HCG BLOOD, ED (MC, WL, AP ONLY): I-stat hCG, quantitative: <5 m[IU]/mL (ref <5)

## 2018-12-10 MED ORDER — KETAMINE HCL 50 MG/5ML IJ SOSY
0.5000 mg/kg | PREFILLED_SYRINGE | Freq: Once | INTRAMUSCULAR | Status: AC
  Administered 2018-12-10: 04:00:00 37 mg via INTRAVENOUS
  Filled 2018-12-10: qty 5

## 2018-12-10 MED ORDER — KETAMINE HCL 10 MG/ML IJ SOLN
INTRAMUSCULAR | Status: AC | PRN
  Administered 2018-12-10: 37.5 mg via INTRAVENOUS

## 2018-12-10 MED ORDER — HYDROMORPHONE HCL 1 MG/ML IJ SOLN
1.0000 mg | Freq: Once | INTRAMUSCULAR | Status: AC
  Administered 2018-12-10: 02:00:00 1 mg via INTRAVENOUS
  Filled 2018-12-10: qty 1

## 2018-12-10 MED ORDER — PROPOFOL 10 MG/ML IV BOLUS
INTRAVENOUS | Status: AC | PRN
  Administered 2018-12-10: 37.5 mg via INTRAVENOUS

## 2018-12-10 MED ORDER — HYDROCODONE-ACETAMINOPHEN 5-325 MG PO TABS: 1.0000 | ORAL_TABLET | ORAL | 0 refills | Status: DC | PRN

## 2018-12-10 MED ORDER — PROPOFOL 10 MG/ML IV BOLUS
0.5000 mg/kg | Freq: Once | INTRAVENOUS | Status: AC
  Administered 2018-12-10: 04:00:00 37.4 mg via INTRAVENOUS
  Filled 2018-12-10: qty 20

## 2018-12-10 MED ORDER — LORAZEPAM 1 MG PO TABS
1.0000 mg | ORAL_TABLET | Freq: Once | ORAL | Status: AC
  Administered 2018-12-10: 03:00:00 1 mg via SUBLINGUAL
  Filled 2018-12-10: qty 1

## 2018-12-10 MED ORDER — ONDANSETRON HCL 4 MG/2ML IJ SOLN
4.0000 mg | Freq: Once | INTRAMUSCULAR | Status: AC
  Administered 2018-12-10: 02:00:00 4 mg via INTRAVENOUS
  Filled 2018-12-10: qty 2

## 2018-12-10 NOTE — ED Notes (Signed)
Ativan 1mg  po at 0230a

## 2018-12-10 NOTE — Progress Notes (Signed)
Orthopedic Tech Progress Note Patient Details:  [name] October 11, 1990 [id number]  Ortho Devices Type of Ortho Device: Sling immobilizer Ortho Device/Splint Location: lue. applied post reduction. Ortho Device/Splint Interventions: Ordered, Application, Adjustment   Post Interventions Patient Tolerated: Well Instructions Provided: Care of device, Adjustment of device   Trinna Post 12/10/2018, 4:38 AM

## 2018-12-10 NOTE — ED Triage Notes (Addendum)
Pt st's she was lifting something and moved her shoulder the wrong way.  Pt c/o severe pain to left shoulder  Pt with possible shoulder dislocation

## 2018-12-10 NOTE — ED Notes (Signed)
Unsuccessful attempt to start iv

## 2018-12-10 NOTE — Sedation Documentation (Signed)
Portable  Post reduction xray of the lt shoulder

## 2018-12-10 NOTE — ED Notes (Signed)
Nurse drawing labs. 
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
## 2018-12-10 NOTE — ED Notes (Signed)
Pt drowsy from the iv med but alert  Waiting for the xray to be read by the  Xray doctor

## 2019-02-14 ENCOUNTER — Other Ambulatory Visit: Payer: Self-pay

## 2019-02-14 ENCOUNTER — Inpatient Hospital Stay (HOSPITAL_COMMUNITY)
Admission: AD | Admit: 2019-02-14 | Discharge: 2019-02-14 | Disposition: A | Discharge status: Home / Self Care | Payer: Medicaid Other | Attending: Obstetrics and Gynecology | Admitting: Obstetrics and Gynecology

## 2019-02-14 DIAGNOSIS — N644 Mastodynia: Secondary | ICD-10-CM | POA: Insufficient documentation

## 2019-02-14 DIAGNOSIS — M545 Low back pain: Secondary | ICD-10-CM | POA: Diagnosis present

## 2019-02-14 DIAGNOSIS — Z3202 Encounter for pregnancy test, result negative: Secondary | ICD-10-CM | POA: Diagnosis not present

## 2019-02-14 LAB — POCT PREGNANCY, URINE: Preg Test, Ur: NEGATIVE

## 2019-02-14 NOTE — MAU Note (Signed)
.   [name] is a 28 y.o. at Unknown here in MAU reporting: lower abdominal and back pain. PT states positive pregnancy test at home. Denies any vaginal bleeding LMP: 12/26/18  Onset of complaint: couple of days Pain score 7 Vitals:   02/14/19 1739 02/14/19 1741  BP:  128/69  Pulse: 84   Resp: 16   Temp: 98.5 F (36.9 C)   SpO2: 100%       Lab orders placed from triage: UPT

## 2019-02-14 NOTE — MAU Provider Note (Addendum)
First Provider Initiated Contact with Patient 02/14/19 1805      S [name] is a 28 y.o. [phone] non-pregnant female who presents to MAU today with complaint of breast tenderness and LBP. Pt states she took two pregnancy tests at home, one positive and one negative. UPT negative in MAU, pt refuses blood draw stating "I don't have a family member with me and I don't like needles."  O BP 128/69   Pulse 84   Temp 98.5 F (36.9 C)   Resp 16   LMP 12/26/2018   SpO2 100%  Physical Exam  Constitutional: She is oriented to person, place, and time. She appears well-developed and well-nourished. No distress.  HENT:  Head: Normocephalic and atraumatic.  Respiratory: Effort normal.  Neurological: She is alert and oriented to person, place, and time.  Skin: She is not diaphoretic.  Psychiatric: She has a normal mood and affect. Her behavior is normal. Judgment and thought content normal.   A Non pregnant female Medical screening exam complete  P Discharge from MAU in stable condition Explained to patient that as we are unable to confirm her pregnancy, she is a more appropriate candidate for evaluation in the emergency department at this time. Pt was offered staff member support during blood draw, opportunity to call a family member or friend and wait in waiting room until that time, or to proceed with blood draw on her own, but pt declines all of these options. In-depth discussion with patient on differences between former women[facility] hospital site and current MAU in terms of patient population. Patient given the option of transfer to [facility] for further evaluation or seek care in outpatient facility of choice List of options for follow-up given Pt declines all options for care at this time and elects to return to home and "wait to see if her period comes in two weeks" Warning signs for worsening condition that would warrant emergency follow-up discussed Patient may return to MAU as  needed for pregnancy related complaints  , Gerrie Nordmann, NP 02/14/2019 6:15 PM

## 2019-05-29 ENCOUNTER — Encounter (HOSPITAL_COMMUNITY): Payer: Self-pay | Admitting: Emergency Medicine

## 2019-05-29 ENCOUNTER — Ambulatory Visit (HOSPITAL_COMMUNITY)
Admission: EM | Admit: 2019-05-29 | Discharge: 2019-05-29 | Disposition: A | Discharge status: Home / Self Care | Payer: Medicaid Other | Attending: Internal Medicine | Admitting: Internal Medicine

## 2019-05-29 ENCOUNTER — Other Ambulatory Visit: Payer: Self-pay

## 2019-05-29 DIAGNOSIS — Z3202 Encounter for pregnancy test, result negative: Secondary | ICD-10-CM | POA: Diagnosis not present

## 2019-05-29 DIAGNOSIS — M549 Dorsalgia, unspecified: Secondary | ICD-10-CM | POA: Diagnosis not present

## 2019-05-29 LAB — POCT URINALYSIS DIP (DEVICE)
Bilirubin Urine: NEGATIVE
Glucose, UA: NEGATIVE mg/dL
Hgb urine dipstick: NEGATIVE
Ketones, ur: NEGATIVE mg/dL
Nitrite: NEGATIVE
Protein, ur: NEGATIVE mg/dL
Specific Gravity, Urine: 1.02 (ref 1.005–1.030)
Urobilinogen, UA: 0.2 mg/dL (ref 0.0–1.0)
pH: 6 (ref 5.0–8.0)

## 2019-05-29 LAB — POCT PREGNANCY, URINE: Preg Test, Ur: NEGATIVE

## 2019-05-29 MED ORDER — ACETAMINOPHEN 325 MG PO TABS: 650.0000 mg | ORAL_TABLET | Freq: Four times a day (QID) | ORAL | Status: DC | PRN

## 2019-05-29 NOTE — ED Triage Notes (Addendum)
Pt reports mid abdominal pain and right lower back pain for over one week. Pt denies any elimination issues, no gas, constipation, N, V, or D.  Pt requesting a pregnancy test.  States she took one about 10 days ago that was negative.
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
## 2020-02-20 ENCOUNTER — Ambulatory Visit: Payer: No Typology Code available for payment source | Attending: Internal Medicine

## 2020-02-20 DIAGNOSIS — Z23 Encounter for immunization: Secondary | ICD-10-CM

## 2020-02-20 NOTE — Progress Notes (Signed)
   Covid-19 Vaccination Clinic  Name:  MAYLINE DRAGON    MRN: [id number] DOB: June 25, 1991  02/20/2020  Ms. Rake was observed post Covid-19 immunization for 15 minutes without incident. She was provided with Vaccine Information Sheet and instruction to access the V-Safe system.   Ms. Blankenburg was instructed to call 911 with any severe reactions post vaccine: Marland Kitchen Difficulty breathing  . Swelling of face and throat  . A fast heartbeat  . A bad rash all over body  . Dizziness and weakness   Immunizations Administered    Name Date Dose VIS Date Route   Pfizer COVID-19 Vaccine 02/20/2020 12:38 PM 0.3 mL 09/06/2018 Intramuscular   Manufacturer: [employer], [employer]   Lot: [id number]   NDC: [address]-1

## 2020-03-12 ENCOUNTER — Ambulatory Visit: Payer: Medicaid Other

## 2020-09-02 ENCOUNTER — Emergency Department (HOSPITAL_COMMUNITY): Payer: Medicaid Other

## 2020-09-02 ENCOUNTER — Encounter (HOSPITAL_COMMUNITY): Payer: Self-pay | Admitting: Emergency Medicine

## 2020-09-02 ENCOUNTER — Emergency Department (HOSPITAL_COMMUNITY)
Admission: EM | Admit: 2020-09-02 | Discharge: 2020-09-02 | Disposition: A | Discharge status: Home / Self Care | Payer: Medicaid Other | Attending: Emergency Medicine | Admitting: Emergency Medicine

## 2020-09-02 DIAGNOSIS — S20412A Abrasion of left back wall of thorax, initial encounter: Secondary | ICD-10-CM | POA: Diagnosis not present

## 2020-09-02 DIAGNOSIS — F17210 Nicotine dependence, cigarettes, uncomplicated: Secondary | ICD-10-CM | POA: Diagnosis not present

## 2020-09-02 DIAGNOSIS — W19XXXA Unspecified fall, initial encounter: Secondary | ICD-10-CM

## 2020-09-02 DIAGNOSIS — S30810A Abrasion of lower back and pelvis, initial encounter: Secondary | ICD-10-CM | POA: Insufficient documentation

## 2020-09-02 DIAGNOSIS — T07XXXA Unspecified multiple injuries, initial encounter: Secondary | ICD-10-CM

## 2020-09-02 DIAGNOSIS — S3992XA Unspecified injury of lower back, initial encounter: Secondary | ICD-10-CM | POA: Diagnosis present

## 2020-09-02 MED ORDER — NAPROXEN 375 MG PO TABS: 375.0000 mg | ORAL_TABLET | Freq: Two times a day (BID) | ORAL | 0 refills | Status: AC

## 2020-09-02 MED ORDER — BACITRACIN ZINC 500 UNIT/GM EX OINT: 1.0000 "application " | TOPICAL_OINTMENT | Freq: Two times a day (BID) | CUTANEOUS | 0 refills | Status: AC

## 2020-09-02 MED ORDER — NAPROXEN 250 MG PO TABS
500.0000 mg | ORAL_TABLET | Freq: Once | ORAL | Status: AC
  Administered 2020-09-02: 500 mg via ORAL
  Filled 2020-09-02: qty 2

## 2020-09-02 MED ORDER — BACITRACIN ZINC 500 UNIT/GM EX OINT
TOPICAL_OINTMENT | Freq: Two times a day (BID) | CUTANEOUS | Status: DC
  Administered 2020-09-02: 1 via TOPICAL
  Filled 2020-09-02: qty 4.5

## 2020-09-02 MED ORDER — CYCLOBENZAPRINE HCL 10 MG PO TABS: 10.0000 mg | ORAL_TABLET | Freq: Three times a day (TID) | ORAL | 0 refills | Status: AC | PRN

## 2020-09-02 MED ORDER — CYCLOBENZAPRINE HCL 10 MG PO TABS
10.0000 mg | ORAL_TABLET | Freq: Once | ORAL | Status: AC
  Administered 2020-09-02: 10 mg via ORAL
  Filled 2020-09-02: qty 1
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
## 2020-09-02 NOTE — Discharge Instructions (Addendum)
We discussed the results of your x-rays at length.  I have prescribed a short course of anti-inflammatories to help with your symptoms.  In addition, I have also prescribed a short course of muscle relaxers, and he should help with muscular pain in the next couple of days.   If you experience any headache, changes in vision, worsening symptoms please return to the emergency department.
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
## 2021-03-28 ENCOUNTER — Other Ambulatory Visit: Payer: Self-pay

## 2021-03-28 ENCOUNTER — Emergency Department (HOSPITAL_COMMUNITY)
Admission: EM | Admit: 2021-03-28 | Discharge: 2021-03-28 | Disposition: A | Discharge status: Home / Self Care | Payer: Medicaid Other | Attending: Emergency Medicine | Admitting: Emergency Medicine

## 2021-03-28 ENCOUNTER — Encounter (HOSPITAL_COMMUNITY): Payer: Self-pay

## 2021-03-28 DIAGNOSIS — F17210 Nicotine dependence, cigarettes, uncomplicated: Secondary | ICD-10-CM | POA: Diagnosis not present

## 2021-03-28 DIAGNOSIS — K0889 Other specified disorders of teeth and supporting structures: Secondary | ICD-10-CM | POA: Diagnosis not present

## 2021-03-28 DIAGNOSIS — N9489 Other specified conditions associated with female genital organs and menstrual cycle: Secondary | ICD-10-CM | POA: Diagnosis not present

## 2021-03-28 DIAGNOSIS — R1110 Vomiting, unspecified: Secondary | ICD-10-CM | POA: Insufficient documentation

## 2021-03-28 LAB — BASIC METABOLIC PANEL
Anion gap: 7 (ref 5–15)
BUN: 8 mg/dL (ref 6–20)
CO2: 20 mmol/L — ABNORMAL LOW (ref 22–32)
Calcium: 9.3 mg/dL (ref 8.9–10.3)
Chloride: 108 mmol/L (ref 98–111)
Creatinine, Ser: 0.82 mg/dL (ref 0.44–1.00)
GFR, Estimated: >60 mL/min (ref >60)
Glucose, Bld: 96 mg/dL (ref 70–99)
Potassium: 3.7 mmol/L (ref 3.5–5.1)
Sodium: 135 mmol/L (ref 135–145)

## 2021-03-28 LAB — CBC WITH DIFFERENTIAL/PLATELET
Abs Immature Granulocytes: 0.01 10*3/uL (ref 0.00–0.07)
Basophils Absolute: 0.1 10*3/uL (ref 0.0–0.1)
Basophils Relative: 1 %
Eosinophils Absolute: 0 10*3/uL (ref 0.0–0.5)
Eosinophils Relative: 0 %
HCT: 34.3 % — ABNORMAL LOW (ref 36.0–46.0)
Hemoglobin: 10.3 g/dL — ABNORMAL LOW (ref 12.0–15.0)
Immature Granulocytes: 0 %
Lymphocytes Relative: 30 %
Lymphs Abs: 1.6 10*3/uL (ref 0.7–4.0)
MCH: 21.6 pg — ABNORMAL LOW (ref 26.0–34.0)
MCHC: 30 g/dL (ref 30.0–36.0)
MCV: 72.1 fL — ABNORMAL LOW (ref 80.0–100.0)
Monocytes Absolute: 0.4 10*3/uL (ref 0.1–1.0)
Monocytes Relative: 7 %
Neutro Abs: 3.2 10*3/uL (ref 1.7–7.7)
Neutrophils Relative %: 62 %
Platelets: 401 10*3/uL — ABNORMAL HIGH (ref 150–400)
RBC: 4.76 MIL/uL (ref 3.87–5.11)
RDW: 20.3 % — ABNORMAL HIGH (ref 11.5–15.5)
WBC: 5.2 10*3/uL (ref 4.0–10.5)
nRBC: 0 % (ref 0.0–0.2)

## 2021-03-28 LAB — I-STAT BETA HCG BLOOD, ED (MC, WL, AP ONLY): I-stat hCG, quantitative: <5 m[IU]/mL (ref <5)

## 2021-03-28 MED ORDER — PENICILLIN V POTASSIUM 500 MG PO TABS: 500.0000 mg | ORAL_TABLET | Freq: Four times a day (QID) | ORAL | 0 refills | Status: AC

## 2021-03-28 MED ORDER — ONDANSETRON 4 MG PO TBDP: 4.0000 mg | ORAL_TABLET | Freq: Three times a day (TID) | ORAL | 0 refills | Status: DC | PRN

## 2021-03-28 MED ORDER — ACETAMINOPHEN 500 MG PO TABS
1000.0000 mg | ORAL_TABLET | Freq: Once | ORAL | Status: AC
  Administered 2021-03-28: 1000 mg via ORAL
  Filled 2021-03-28: qty 2

## 2021-03-28 NOTE — ED Triage Notes (Signed)
Pt reports right lower dental pain for the past 4 days along with emesis, unable to keep anything down.

## 2021-03-28 NOTE — ED Provider Notes (Signed)
Emergency Medicine Provider Triage Evaluation Note  [name] , a 30 y.o. female  was evaluated in triage.  Pt complains of dental pain x5 days, worsened in the last 2 days.  Denies any purulent discharge.  No fevers at home.  For last 2 days patient is having upwards of 7 episodes of emesis daily without any associated abdominal pain..  Review of Systems  Positive: Dental pain, emesis Negative: Fever, difficulty swallowing  Physical Exam  BP 126/86 (BP Location: Left Arm)   Pulse 84   Temp 98.7 F (37.1 C) (Oral)   Resp 18   SpO2 100%  Gen:   Awake, no distress   Resp:  Normal effort  MSK:   Moves extremities without difficulty  Other:  Poor dentition, no obvious fluctuance or discharge.  Uvula is midline.  Medical Decision Making  Medically screening exam initiated at 3:40 PM.  Appropriate orders placed.  [name] was informed that the remainder of the evaluation will be completed by another provider, this initial triage assessment does not replace that evaluation, and the importance of remaining in the ED until their evaluation is complete.  Will check electrolytes given patient states she is vomiting 7 times daily and also check for pregnancy.  Suspect this is likely just dental pain and she can be treated outpatient with some antibiotics and follow-up with a dentist.    Theron Arista, PA-C 03/28/21 1542    Wynetta Fines, MD 03/29/21 [phone]
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
## 2021-03-28 NOTE — Discharge Instructions (Addendum)
Take the nausea medicine every 6 hours as needed Take penicillin 4 times daily for the next 7 days. Schedule follow-up with the dental group to be reevaluated. Return if worse.

## 2021-06-26 ENCOUNTER — Ambulatory Visit (INDEPENDENT_AMBULATORY_CARE_PROVIDER_SITE_OTHER): Payer: Medicaid Other

## 2021-06-26 ENCOUNTER — Ambulatory Visit (HOSPITAL_COMMUNITY)
Admission: RE | Admit: 2021-06-26 | Discharge: 2021-06-26 | Disposition: A | Discharge status: Home / Self Care | Payer: Medicaid Other | Source: Ambulatory Visit | Attending: Urgent Care | Admitting: Urgent Care

## 2021-06-26 ENCOUNTER — Encounter (HOSPITAL_COMMUNITY): Payer: Self-pay

## 2021-06-26 ENCOUNTER — Other Ambulatory Visit: Payer: Self-pay

## 2021-06-26 VITALS — BP 131/80 | HR 66 | Temp 99.1°F | Resp 17

## 2021-06-26 DIAGNOSIS — S62639A Displaced fracture of distal phalanx of unspecified finger, initial encounter for closed fracture: Secondary | ICD-10-CM

## 2021-06-26 DIAGNOSIS — S62635A Displaced fracture of distal phalanx of left ring finger, initial encounter for closed fracture: Secondary | ICD-10-CM | POA: Diagnosis not present

## 2021-06-26 NOTE — ED Provider Notes (Signed)
[facility]    CSN: [id number] Arrival date & time: 06/26/21  1154      History   Chief Complaint Chief Complaint  Patient presents with   Appointment    1200   finger problem    HPI [name] is a 30 y.o. female.   Pleasant 30 year old female presents today with complaints of left ring finger pain and inability to extend her DIP joint after pushing a heavy box full of deodorant 2 days ago.  She states the pain happened instantaneously with some swelling.  She reports FROM of remainder of fingers and wrist. No drainage, skin changes, or warmth. She has been placing a heating pad on it and resting her hand, but denies any additional treatments.  She notes no prior history of injury to her left finger.  She has no chronic medical conditions and takes no daily medications.    Past Medical History:  Diagnosis Date   Anemia    Blood transfusion    2010 s/p SVD   Chronic back pain    Depression    not on meds   GERD (gastroesophageal reflux disease)    Mental disorder    Pregnancy induced hypertension 2010   required Mag Sulfate   Preterm labor    first pregnancy/current pregnancy   UTI (urinary tract infection) in pregnancy, antepartum    09/2011 tx with abx    Patient Active Problem List   Diagnosis Date Noted   Encounter for Depo-Provera contraception 06/15/2012   Pelvic pain in female 06/15/2012   NSVD (normal spontaneous vaginal delivery) 12/11/2011   High-risk pregnancy supervision 10/22/2011   History of preterm delivery, currently pregnant 10/22/2011   Dental caries 10/22/2011   Abdominal trauma 10/21/2011    Past Surgical History:  Procedure Laterality Date   FINGER SURGERY     right thumb in childhood    OB History     Gravida  3   Para  3   Term  2   Preterm  1   AB  0   Living  3      SAB  0   IAB  0   Ectopic  0   Multiple  0   Live Births  3            Home Medications    Prior to Admission  medications   Medication Sig Start Date End Date Taking? Authorizing Provider  acetaminophen (TYLENOL) 325 MG tablet Take 2 tablets (650 mg total) by mouth every 6 (six) hours as needed. 05/29/19   Merrilee Jansky, MD  ondansetron (ZOFRAN ODT) 4 MG disintegrating tablet Take 1 tablet (4 mg total) by mouth every 8 (eight) hours as needed for nausea or vomiting. 03/28/21   Theron Arista, PA-C    Family History Family History  Problem Relation Age of Onset   Asthma Mother    Cancer Mother        colon   Diabetes Father    Hypertension Father    Mental illness Brother        bipolar, schizophrenia   Anesthesia problems Neg Hx     Social History Social History   Tobacco Use   Smoking status: Every Day    Packs/day: 0.50    Types: Cigarettes   Smokeless tobacco: Never  Vaping Use   Vaping Use: Never used  Substance Use Topics   Alcohol use: No    Comment: socail   Drug use: No  Allergies   Patient has no known allergies.   Review of Systems Review of Systems  Musculoskeletal:  Positive for arthralgias (L ring finger) and joint swelling (minimal L DIP 4th digit).  All other systems reviewed and are negative.   Physical Exam Triage Vital Signs ED Triage Vitals [06/26/21 1217]  Enc Vitals Group     BP      Pulse      Resp      Temp      Temp src      SpO2      Weight      Height      Head Circumference      Peak Flow      Pain Score 2     Pain Loc      Pain Edu?      Excl. in GC?    No data found.  Updated Vital Signs BP 131/80 (BP Location: Right Arm)    Pulse 66    Temp 99.1 F (37.3 C) (Oral)    Resp 17    SpO2 97%   Visual Acuity Right Eye Distance:   Left Eye Distance:   Bilateral Distance:    Right Eye Near:   Left Eye Near:    Bilateral Near:     Physical Exam Vitals and nursing note reviewed.  Constitutional:      General: She is not in acute distress.    Appearance: She is well-developed.  HENT:     Head: Normocephalic and  atraumatic.  Eyes:     Conjunctiva/sclera: Conjunctivae normal.  Cardiovascular:     Rate and Rhythm: Normal rate and regular rhythm.     Heart sounds: No murmur heard. Pulmonary:     Effort: Pulmonary effort is normal. No respiratory distress.     Breath sounds: Normal breath sounds.  Abdominal:     Palpations: Abdomen is soft.     Tenderness: There is no abdominal tenderness.  Musculoskeletal:        General: Tenderness (to L 4th DIP joint) and deformity (swan neck deformity 4th digit on L) present. No swelling or signs of injury.     Cervical back: Neck supple.  Skin:    General: Skin is warm and dry.     Capillary Refill: Capillary refill takes less than 2 seconds.  Neurological:     Mental Status: She is alert.  Psychiatric:        Mood and Affect: Mood normal.     UC Treatments / Results  Labs (all labs ordered are listed, but only abnormal results are displayed) Labs Reviewed - No data to display  EKG   Radiology DG Finger Ring Left  Result Date: 06/26/2021 CLINICAL DATA:  Inability to extend DIP joint EXAM: LEFT RING FINGER 2+V COMPARISON:  None. FINDINGS: Small osseous fragment is seen at the dorsal aspect of the base of distal phalanx of the 4th finger. No evidence of dislocation. There is no evidence of arthropathy or other focal bone abnormality. Soft tissues are unremarkable. IMPRESSION: Probable small avulsion fracture of the dorsal aspect of the base of the distal phalanx of the 4th finger. Electronically Signed   By: Allegra Lai M.D.   On: 06/26/2021 12:45    Procedures Procedures (including critical care time)  Medications Ordered in UC Medications - No data to display  Initial Impression / Assessment and Plan / UC Course  I have reviewed the triage vital signs and the nursing notes.  Pertinent  labs & imaging results that were available during my care of the patient were reviewed by me and considered in my medical decision making (see chart for  details).     Avulsion fracture left 4th DIP - wear splint until seen by hand specialist. Referral placed. NSAID as needed for pain control and swelling.  Final Clinical Impressions(s) / UC Diagnoses   Final diagnoses:  Closed avulsion fracture of distal phalanx of finger, initial encounter     Discharge Instructions      Wear finger brace until seen by ortho. May take OTC ibuprofen as needed for pain or swelling, maximum 800mg  three times daily. Follow up with ortho in 1 week     ED Prescriptions   None    PDMP not reviewed this encounter.   , Maretta Bees 06/26/21 1331

## 2021-06-26 NOTE — Discharge Instructions (Addendum)
Wear finger brace until seen by ortho. May take OTC ibuprofen as needed for pain or swelling, maximum 800mg  three times daily. Follow up with ortho in 1 week

## 2021-06-26 NOTE — ED Triage Notes (Signed)
Pt reports left ring finger is bend x 2 days.

## 2021-07-31 ENCOUNTER — Emergency Department (HOSPITAL_COMMUNITY)
Admission: EM | Admit: 2021-07-31 | Discharge: 2021-07-31 | Disposition: A | Discharge status: Home / Self Care | Payer: Medicaid Other | Attending: Emergency Medicine | Admitting: Emergency Medicine

## 2021-07-31 ENCOUNTER — Other Ambulatory Visit: Payer: Self-pay

## 2021-07-31 ENCOUNTER — Encounter (HOSPITAL_COMMUNITY): Payer: Self-pay

## 2021-07-31 DIAGNOSIS — Z5321 Procedure and treatment not carried out due to patient leaving prior to being seen by health care provider: Secondary | ICD-10-CM | POA: Diagnosis not present

## 2021-07-31 DIAGNOSIS — S2096XA Insect bite (nonvenomous) of unspecified parts of thorax, initial encounter: Secondary | ICD-10-CM | POA: Insufficient documentation

## 2021-07-31 DIAGNOSIS — W57XXXA Bitten or stung by nonvenomous insect and other nonvenomous arthropods, initial encounter: Secondary | ICD-10-CM | POA: Diagnosis not present

## 2021-07-31 MED ORDER — HYDROCORTISONE 1 % EX CREA
TOPICAL_CREAM | Freq: Two times a day (BID) | CUTANEOUS | Status: DC
  Filled 2021-07-31: qty 28

## 2021-07-31 MED ORDER — HYDROXYZINE HCL 25 MG PO TABS: 25.0000 mg | ORAL_TABLET | Freq: Four times a day (QID) | ORAL | 0 refills | Status: DC

## 2021-07-31 MED ORDER — HYDROXYZINE HCL 25 MG PO TABS
25.0000 mg | ORAL_TABLET | Freq: Once | ORAL | Status: AC
  Administered 2021-07-31: 25 mg via ORAL
  Filled 2021-07-31: qty 1

## 2021-07-31 NOTE — ED Notes (Signed)
Pt was very anxious & crying from fear of bugs in her hair. This RN & her boyfriend talked her down & she agreed to shower & clean her home for the bugs when she returns. Creme from pharmacy was tubed up & was given to pt as they were walking out the door for d/c, they left before v/s could be updated.

## 2021-07-31 NOTE — Discharge Instructions (Addendum)
As discussed, the discomfort from a bedbug bites typically lasts about 3 to 5 days even with appropriate therapy.  Please use the provided steroid cream on all of your bites 2 or 3 times daily.  Obtain and use the provided anti-itch medication as well.  Return here for concerning changes in your condition. °

## 2021-07-31 NOTE — ED Triage Notes (Signed)
Pt came in via POV from home d/t c/o bed bug bites that she stated was first noticed yesterday evening.

## 2021-08-05 NOTE — ED Provider Notes (Signed)
[facility] EMERGENCY DEPARTMENT Provider Note   CSN: [id number] Arrival date & time: 07/31/21  0750     History  Chief Complaint  Patient presents with   bed bug bites    [name] is a 31 y.o. female.  HPI Patient presents with her female companion who is essentially the same complaints.  She has multiple areas of itchiness throughout her body.  She has nothing in a hotel, and has seen bedbugs, into different rooms after she attempted to switch from 1 to another, attempted to clean the room.  No fever, vomiting, focal pain, but she has diffuse itchiness in multiple areas throughout her habitus, as does her female companion. No medication taken for relief.    Home Medications Prior to Admission medications   Medication Sig Start Date End Date Taking? Authorizing Provider  hydrOXYzine (ATARAX) 25 MG tablet Take 1 tablet (25 mg total) by mouth every 6 (six) hours. 07/31/21  Yes Gerhard Munch, MD  acetaminophen (TYLENOL) 325 MG tablet Take 2 tablets (650 mg total) by mouth every 6 (six) hours as needed. 05/29/19   Merrilee Jansky, MD  ondansetron (ZOFRAN ODT) 4 MG disintegrating tablet Take 1 tablet (4 mg total) by mouth every 8 (eight) hours as needed for nausea or vomiting. 03/28/21   Theron Arista, PA-C      Allergies    Patient has no known allergies.    Review of Systems   Review of Systems  Constitutional:        Per HPI, otherwise negative  HENT:         Per HPI, otherwise negative  Respiratory:         Per HPI, otherwise negative  Cardiovascular:        Per HPI, otherwise negative  Gastrointestinal:  Negative for vomiting.  Endocrine:       Negative aside from HPI  Genitourinary:        Neg aside from HPI   Musculoskeletal:        Per HPI, otherwise negative  Skin:  Positive for rash.  Neurological:  Negative for syncope.   Physical Exam Updated Vital Signs There were no vitals taken for this visit. Physical Exam Vitals and nursing  note reviewed.  Constitutional:      General: She is not in acute distress.    Appearance: She is well-developed.  HENT:     Head: Normocephalic and atraumatic.  Eyes:     Conjunctiva/sclera: Conjunctivae normal.  Cardiovascular:     Rate and Rhythm: Normal rate and regular rhythm.  Pulmonary:     Effort: Pulmonary effort is normal. No respiratory distress.     Breath sounds: Normal breath sounds. No stridor.  Abdominal:     General: There is no distension.  Skin:    General: Skin is warm and dry.     Comments: Innumerable minimally raised erythematous lesions, no surrounding erythema, no confluence  Neurological:     Mental Status: She is alert and oriented to person, place, and time.     Cranial Nerves: No cranial nerve deficit.    ED Results / Procedures / Treatments   Labs (all labs ordered are listed, but only abnormal results are displayed) Labs Reviewed - No data to display  EKG None  Radiology No results found.  Procedures Procedures    Medications Ordered in ED Medications  hydrOXYzine (ATARAX) tablet 25 mg (25 mg Oral Given 07/31/21 1751)    ED Course/ Medical Decision  Making/ A&P Adult female presents with cutaneous lesions consistent with bedbugs.  No evidence for cellulitis, bacteremia, sepsis.  We discussed options for therapy and patient was started on prescription antihistamine, provided steroid cream here.   Final Clinical Impression(s) / ED Diagnoses Final diagnoses:  Bedbug bite, initial encounter    Rx / DC Orders ED Discharge Orders          Ordered    hydrOXYzine (ATARAX) 25 MG tablet  Every 6 hours        07/31/21 0911              Gerhard Munch, MD 08/05/21 1611

## 2021-12-04 ENCOUNTER — Emergency Department (HOSPITAL_COMMUNITY): Payer: Medicaid Other

## 2021-12-04 ENCOUNTER — Other Ambulatory Visit: Payer: Self-pay

## 2021-12-04 ENCOUNTER — Encounter (HOSPITAL_COMMUNITY): Payer: Self-pay

## 2021-12-04 ENCOUNTER — Emergency Department (HOSPITAL_COMMUNITY)
Admission: EM | Admit: 2021-12-04 | Discharge: 2021-12-04 | Disposition: A | Discharge status: Home / Self Care | Payer: Medicaid Other | Attending: Emergency Medicine | Admitting: Emergency Medicine

## 2021-12-04 DIAGNOSIS — R1084 Generalized abdominal pain: Secondary | ICD-10-CM

## 2021-12-04 DIAGNOSIS — N3000 Acute cystitis without hematuria: Secondary | ICD-10-CM | POA: Insufficient documentation

## 2021-12-04 DIAGNOSIS — N9489 Other specified conditions associated with female genital organs and menstrual cycle: Secondary | ICD-10-CM | POA: Diagnosis not present

## 2021-12-04 DIAGNOSIS — R112 Nausea with vomiting, unspecified: Secondary | ICD-10-CM | POA: Diagnosis present

## 2021-12-04 DIAGNOSIS — F17210 Nicotine dependence, cigarettes, uncomplicated: Secondary | ICD-10-CM | POA: Diagnosis not present

## 2021-12-04 LAB — URINALYSIS, ROUTINE W REFLEX MICROSCOPIC
Bilirubin Urine: NEGATIVE
Glucose, UA: NEGATIVE mg/dL
Hgb urine dipstick: NEGATIVE
Ketones, ur: NEGATIVE mg/dL
Nitrite: NEGATIVE
Protein, ur: NEGATIVE mg/dL
Specific Gravity, Urine: 1.02 (ref 1.005–1.030)
pH: 7 (ref 5.0–8.0)

## 2021-12-04 LAB — CBC
HCT: 37.6 % (ref 36.0–46.0)
Hemoglobin: 11.3 g/dL — ABNORMAL LOW (ref 12.0–15.0)
MCH: 22.4 pg — ABNORMAL LOW (ref 26.0–34.0)
MCHC: 30.1 g/dL (ref 30.0–36.0)
MCV: 74.6 fL — ABNORMAL LOW (ref 80.0–100.0)
Platelets: 287 10*3/uL (ref 150–400)
RBC: 5.04 MIL/uL (ref 3.87–5.11)
RDW: 18.4 % — ABNORMAL HIGH (ref 11.5–15.5)
WBC: 11.2 10*3/uL — ABNORMAL HIGH (ref 4.0–10.5)
nRBC: 0 % (ref 0.0–0.2)

## 2021-12-04 LAB — COMPREHENSIVE METABOLIC PANEL
ALT: 16 U/L (ref 0–44)
AST: 17 U/L (ref 15–41)
Albumin: 3.5 g/dL (ref 3.5–5.0)
Alkaline Phosphatase: 52 U/L (ref 38–126)
Anion gap: 7 (ref 5–15)
BUN: 10 mg/dL (ref 6–20)
CO2: 21 mmol/L — ABNORMAL LOW (ref 22–32)
Calcium: 8.9 mg/dL (ref 8.9–10.3)
Chloride: 109 mmol/L (ref 98–111)
Creatinine, Ser: 0.76 mg/dL (ref 0.44–1.00)
GFR, Estimated: >60 mL/min (ref >60)
Glucose, Bld: 106 mg/dL — ABNORMAL HIGH (ref 70–99)
Potassium: 4.1 mmol/L (ref 3.5–5.1)
Sodium: 137 mmol/L (ref 135–145)
Total Bilirubin: 0.7 mg/dL (ref 0.3–1.2)
Total Protein: 7 g/dL (ref 6.5–8.1)

## 2021-12-04 LAB — LIPASE, BLOOD: Lipase: 25 U/L (ref 11–51)

## 2021-12-04 LAB — I-STAT BETA HCG BLOOD, ED (MC, WL, AP ONLY): I-stat hCG, quantitative: <5 m[IU]/mL (ref <5)

## 2021-12-04 MED ORDER — IOHEXOL 300 MG/ML  SOLN
100.0000 mL | Freq: Once | INTRAMUSCULAR | Status: AC | PRN
  Administered 2021-12-04: 100 mL via INTRAVENOUS

## 2021-12-04 MED ORDER — ONDANSETRON HCL 4 MG/2ML IJ SOLN
4.0000 mg | Freq: Once | INTRAMUSCULAR | Status: AC
  Administered 2021-12-04: 4 mg via INTRAVENOUS
  Filled 2021-12-04: qty 2

## 2021-12-04 MED ORDER — NITROFURANTOIN MONOHYD MACRO 100 MG PO CAPS: 100.0000 mg | ORAL_CAPSULE | Freq: Two times a day (BID) | ORAL | 0 refills | Status: DC

## 2021-12-04 MED ORDER — IOHEXOL 300 MG/ML  SOLN: 100.0000 mL | Freq: Once | INTRAMUSCULAR | Status: DC | PRN

## 2021-12-04 MED ORDER — MORPHINE SULFATE (PF) 4 MG/ML IV SOLN
4.0000 mg | Freq: Once | INTRAVENOUS | Status: AC
  Administered 2021-12-04: 4 mg via INTRAVENOUS
  Filled 2021-12-04: qty 1

## 2021-12-04 MED ORDER — SODIUM CHLORIDE 0.9 % IV BOLUS
1000.0000 mL | Freq: Once | INTRAVENOUS | Status: AC
  Administered 2021-12-04: 1000 mL via INTRAVENOUS

## 2021-12-04 MED ORDER — ONDANSETRON 4 MG PO TBDP: 4.0000 mg | ORAL_TABLET | Freq: Three times a day (TID) | ORAL | 0 refills | Status: DC | PRN

## 2021-12-04 NOTE — ED Provider Notes (Signed)
[facility] EMERGENCY DEPARTMENT Provider Note   CSN: [id number] Arrival date & time: 12/04/21  1093     History  Chief Complaint  Patient presents with   Abdominal Pain    [name] is a 31 y.o. female.  Patient with no pertinent past medical history presents today with complaints of nausea, vomiting, diarrhea, and abdominal pain. She states that same awoke her from sleep around 0300. She states that she was well when she went to bed last night. She states that her pain is severe in nature, 9/10, and located throughout her abdomen radiating to her back. She denies ever feeling pain like this before. She also denies any history of abdominal surgeries. LMP was at the beginning of May. She endorses daily cigarette smoking, occasional alcohol consumption but none recently, denies marijuana use or any other recreational drug use. No known sick contacts. Endorses some mild dysuria without hematuria. Denies hematochezia, hematemesis, melena.  The history is provided by the patient. No language interpreter was used.  Abdominal Pain Associated symptoms: diarrhea, nausea and vomiting       Home Medications Prior to Admission medications   Medication Sig Start Date End Date Taking? Authorizing Provider  acetaminophen (TYLENOL) 325 MG tablet Take 2 tablets (650 mg total) by mouth every 6 (six) hours as needed. 05/29/19   LampteyBritta Mccreedy, MD  hydrOXYzine (ATARAX) 25 MG tablet Take 1 tablet (25 mg total) by mouth every 6 (six) hours. 07/31/21   Gerhard Munch, MD  ondansetron (ZOFRAN ODT) 4 MG disintegrating tablet Take 1 tablet (4 mg total) by mouth every 8 (eight) hours as needed for nausea or vomiting. 03/28/21   Theron Arista, PA-C      Allergies    Patient has no known allergies.    Review of Systems   Review of Systems  Gastrointestinal:  Positive for abdominal pain, diarrhea, nausea and vomiting.  All other systems reviewed and are negative.  Physical  Exam Updated Vital Signs BP 122/73   Pulse 95   Temp (!) 97.2 F (36.2 C) (Oral)   Resp (!) 23   Ht 5\' 7"  (1.702 m)   Wt 74.8 kg   SpO2 100%   BMI 25.83 kg/m  Physical Exam Vitals and nursing note reviewed.  Constitutional:      General: She is not in acute distress.    Appearance: Normal appearance. She is normal weight. She is not ill-appearing, toxic-appearing or diaphoretic.  HENT:     Head: Normocephalic and atraumatic.  Cardiovascular:     Rate and Rhythm: Normal rate.  Pulmonary:     Effort: Pulmonary effort is normal. No respiratory distress.  Abdominal:     General: Abdomen is flat.     Palpations: Abdomen is soft.     Tenderness: There is generalized abdominal tenderness.  Musculoskeletal:        General: Normal range of motion.     Cervical back: Normal range of motion.  Skin:    General: Skin is warm and dry.  Neurological:     General: No focal deficit present.     Mental Status: She is alert.  Psychiatric:        Mood and Affect: Mood normal.        Behavior: Behavior normal.    ED Results / Procedures / Treatments   Labs (all labs ordered are listed, but only abnormal results are displayed) Labs Reviewed  COMPREHENSIVE METABOLIC PANEL - Abnormal; Notable for the following  components:      Result Value   CO2 21 (*)    Glucose, Bld 106 (*)    All other components within normal limits  CBC - Abnormal; Notable for the following components:   WBC 11.2 (*)    Hemoglobin 11.3 (*)    MCV 74.6 (*)    MCH 22.4 (*)    RDW 18.4 (*)    All other components within normal limits  LIPASE, BLOOD  URINALYSIS, ROUTINE W REFLEX MICROSCOPIC  I-STAT BETA HCG BLOOD, ED ([facility], [facility], [facility] ONLY)    EKG None  Radiology US Abdomen Limited RUQ (LIVER/GB)  Result Date: 12/04/2021 CLINICAL DATA:  Right upper quadrant pain with vomiting since this morning. EXAM: ULTRASOUND ABDOMEN LIMITED RIGHT UPPER QUADRANT COMPARISON:  None Available. FINDINGS: Gallbladder: No  gallbladder wall thickening or pericholecystic fluid. There is an echogenic nonshadowing 5 mm focus within the gallbladder lumen that is mobile with changes in positioning but does not shadow. This may represent a nonshadowing stone or focus of gallbladder sludge. Common bile duct: Diameter: 2.5 mm, within normal limits. No intrahepatic biliary ductal dilatation. Liver: No focal lesion identified. Within normal limits in parenchymal echogenicity. Portal vein is patent on color Doppler imaging with normal direction of blood flow towards the liver. Other: None. IMPRESSION: There is a small 5 mm echogenic nonshadowing focus within the gallbladder which the sonographer reports is mobile. Given the mobility, this may represent a nonshadowing gallstone or focus of gallbladder sludge. Otherwise, no sonographic evidence of acute cholecystitis. Electronically Signed   By: Neita Garnetonald  Viola M.D.   On: 12/04/2021 10:08    Procedures Procedures    Medications Ordered in ED Medications  ondansetron (ZOFRAN) injection 4 mg (has no administration in time range)  sodium chloride 0.9 % bolus 1,000 mL (has no administration in time range)  morphine (PF) 4 MG/ML injection 4 mg (has no administration in time range)    ED Course/ Medical Decision Making/ A&P                           Medical Decision Making Amount and/or Complexity of Data Reviewed Labs: ordered. Radiology: ordered.  Risk Prescription drug management.   This patient presents to the ED for concern of abdominal pain, nausea, vomiting, and diarrhea, this involves an extensive number of treatment options, and is a complaint that carries with it a high risk of complications and morbidity.   Co morbidities that complicate the patient evaluation  none  Lab Tests:  I Ordered, and personally interpreted labs.  The pertinent results include:  WBC 11.2, hemoglobin 11.3 improved from baseline. UA infectious   Imaging Studies ordered:  I ordered  imaging studies including RUQ US, CT abdomen pelvis with contrast  I independently visualized and interpreted imaging which showed  RUQ: There is a small 5 mm echogenic nonshadowing focus within the gallbladder which the sonographer reports is mobile. Given the mobility, this may represent a nonshadowing gallstone or focus of gallbladder sludge. Otherwise, no sonographic evidence of acute cholecystitis. CT: No acute abnormality identified in the abdomen or pelvis I agree with the radiologist interpretation   Problem List / ED Course / Critical interventions / Medication management  I ordered medication including morphine, zofran, fluids  for pain, nausea, and dehydration  Reevaluation of the patient after these medicines showed that the patient improved I have reviewed the patients home medicines and have made adjustments as needed   Test /  Admission - Considered:  Patient is nontoxic, nonseptic appearing, in no apparent distress.  Patient's pain and other symptoms adequately managed in emergency department.  Fluid bolus given.  Labs, imaging and vitals reviewed.  Patient does not meet the SIRS or Sepsis criteria.  On repeat exam patient does not have a surgical abdomin and there are no peritoneal signs.  No indication of appendicitis, bowel obstruction, bowel perforation, cholecystitis, diverticulitis, PID or ectopic pregnancy. Patient does have evidence of UTI on UA and endorses some mild dysuria. Will give Macrobid for same. No CVA tenderness, low suspicion for pyelonephritis. Suspect that patients GI symptoms are related to viral gastroenteritis. Upon reassessment, patient states that she is feeling much better and has not had any subsequent episodes of nausea, vomiting, or diarrhea since arriving here earlier today. She is currently drinking water in the room without difficulty. She is therefore stable for discharge at thsi time. Patient discharged home with Zofran and given strict instructions  for follow-up with their primary care physician.  I have also discussed reasons to return immediately to the ER.  Patient expresses understanding and agrees with plan. Discharged in stable condition.   Final Clinical Impression(s) / ED Diagnoses Final diagnoses:  Generalized abdominal pain  Nausea vomiting and diarrhea  Acute cystitis without hematuria    Rx / DC Orders ED Discharge Orders          Ordered    nitrofurantoin, macrocrystal-monohydrate, (MACROBID) 100 MG capsule  2 times daily        12/04/21 1328    ondansetron (ZOFRAN-ODT) 4 MG disintegrating tablet  Every 8 hours PRN        12/04/21 1328          An After Visit Summary was printed and given to the patient.     Vear Clock 12/04/21 1331    Linwood Dibbles, MD 12/05/21 [phone]

## 2021-12-04 NOTE — Discharge Instructions (Addendum)
As we discussed, your work-up in the ER today was reassuring for acute abnormalities.  Your urine sample did show signs of infection, therefore I have given you a prescription for antibiotics for you to take as prescribed for management of this.  I have also given you a prescription for Zofran which is a nausea medication you can take as needed.  Follow-up with your primary care doctor in the next few days for continued evaluation and management of your symptoms.  Return if development of any new or worsening symptoms.

## 2021-12-04 NOTE — ED Triage Notes (Signed)
Pt arrived via [facility] from home for c/o N/V/D, generalized abdominal pain that started at 0300 today.

## 2021-12-04 NOTE — ED Notes (Signed)
Pt verbalizes understanding of discharge instructions. Opportunity for questions and answers were provided. Pt discharged from the ED.   ?

## 2022-06-20 ENCOUNTER — Emergency Department (HOSPITAL_COMMUNITY): Payer: Medicaid Other

## 2022-06-20 ENCOUNTER — Emergency Department (HOSPITAL_COMMUNITY)
Admission: EM | Admit: 2022-06-20 | Discharge: 2022-06-20 | Disposition: A | Discharge status: Home / Self Care | Payer: Medicaid Other | Attending: Emergency Medicine | Admitting: Emergency Medicine

## 2022-06-20 DIAGNOSIS — S43015A Anterior dislocation of left humerus, initial encounter: Secondary | ICD-10-CM | POA: Diagnosis not present

## 2022-06-20 DIAGNOSIS — W1839XA Other fall on same level, initial encounter: Secondary | ICD-10-CM | POA: Diagnosis not present

## 2022-06-20 DIAGNOSIS — S4992XA Unspecified injury of left shoulder and upper arm, initial encounter: Secondary | ICD-10-CM | POA: Diagnosis present

## 2022-06-20 DIAGNOSIS — Y9289 Other specified places as the place of occurrence of the external cause: Secondary | ICD-10-CM | POA: Insufficient documentation

## 2022-06-20 DIAGNOSIS — S43005A Unspecified dislocation of left shoulder joint, initial encounter: Secondary | ICD-10-CM

## 2022-06-20 LAB — BASIC METABOLIC PANEL
Anion gap: 10 (ref 5–15)
BUN: 6 mg/dL (ref 6–20)
CO2: 18 mmol/L — ABNORMAL LOW (ref 22–32)
Calcium: 9.2 mg/dL (ref 8.9–10.3)
Chloride: 109 mmol/L (ref 98–111)
Creatinine, Ser: 0.86 mg/dL (ref 0.44–1.00)
GFR, Estimated: >60 mL/min (ref >60)
Glucose, Bld: 122 mg/dL — ABNORMAL HIGH (ref 70–99)
Potassium: 3.6 mmol/L (ref 3.5–5.1)
Sodium: 137 mmol/L (ref 135–145)

## 2022-06-20 LAB — CBC
HCT: 32.6 % — ABNORMAL LOW (ref 36.0–46.0)
Hemoglobin: 9.8 g/dL — ABNORMAL LOW (ref 12.0–15.0)
MCH: 22.2 pg — ABNORMAL LOW (ref 26.0–34.0)
MCHC: 30.1 g/dL (ref 30.0–36.0)
MCV: 73.8 fL — ABNORMAL LOW (ref 80.0–100.0)
Platelets: 389 10*3/uL (ref 150–400)
RBC: 4.42 MIL/uL (ref 3.87–5.11)
RDW: 19.2 % — ABNORMAL HIGH (ref 11.5–15.5)
WBC: 6.7 10*3/uL (ref 4.0–10.5)
nRBC: 0 % (ref 0.0–0.2)

## 2022-06-20 LAB — I-STAT BETA HCG BLOOD, ED (MC, WL, AP ONLY): I-stat hCG, quantitative: <5 m[IU]/mL (ref <5)

## 2022-06-20 MED ORDER — ONDANSETRON HCL 4 MG/2ML IJ SOLN
4.0000 mg | Freq: Once | INTRAMUSCULAR | Status: AC
  Administered 2022-06-20: 4 mg via INTRAVENOUS
  Filled 2022-06-20: qty 2

## 2022-06-20 MED ORDER — LACTATED RINGERS IV BOLUS
1000.0000 mL | Freq: Once | INTRAVENOUS | Status: AC
  Administered 2022-06-20: 1000 mL via INTRAVENOUS

## 2022-06-20 MED ORDER — FENTANYL CITRATE PF 50 MCG/ML IJ SOSY
50.0000 ug | PREFILLED_SYRINGE | Freq: Once | INTRAMUSCULAR | Status: AC
  Administered 2022-06-20: 50 ug via INTRAVENOUS
  Filled 2022-06-20: qty 1

## 2022-06-20 MED ORDER — PROPOFOL 10 MG/ML IV BOLUS
1.0000 mg/kg | Freq: Once | INTRAVENOUS | Status: DC
  Filled 2022-06-20: qty 20

## 2022-06-20 MED ORDER — OXYCODONE-ACETAMINOPHEN 5-325 MG PO TABS
1.0000 | ORAL_TABLET | Freq: Once | ORAL | Status: DC
  Filled 2022-06-20: qty 1

## 2022-06-20 MED ORDER — KETAMINE HCL 50 MG/5ML IJ SOSY
1.0000 mg/kg | PREFILLED_SYRINGE | Freq: Once | INTRAMUSCULAR | Status: AC
  Administered 2022-06-20: 50 mg via INTRAVENOUS
  Filled 2022-06-20: qty 10

## 2022-06-20 NOTE — Discharge Instructions (Signed)
Maintain arm in sling until you follow-up with orthopedic surgery.  Their telephone numbers below.  Call the number on Monday to set up an appointment this week.  Take ibuprofen and Tylenol as needed for soreness.

## 2022-06-20 NOTE — ED Triage Notes (Signed)
Pt BIB [facility] from shelter after falling and injuring shoulder. EMS reports deformity and hx of dislocation. Pt A&Ox4
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
## 2022-06-20 NOTE — ED Notes (Signed)
Got patient on the monitor patient is resting with call bell in reach  ?

## 2022-06-20 NOTE — ED Provider Notes (Signed)
.  Ortho Injury Treatment  Date/Time: 06/20/2022 6:30 PM  Performed by: Linward Foster, MD Authorized by: Gloris Manchester, MD   Consent:    Consent obtained:  Written   Consent given by:  Patient   Risks discussed:  Irreducible dislocation, fracture, recurrent dislocation, restricted joint movement, stiffness, vascular damage and nerve damage   Alternatives discussed:  No treatment, immobilization and delayed treatmentInjury location: shoulder Location details: left shoulder Injury type: dislocation Dislocation type: anterior Hill-Sachs deformity: no Chronicity: recurrent Pre-procedure neurovascular assessment: neurovascularly intact  Patient sedated: Yes. Refer to sedation procedure documentation for details of sedation. Manipulation performed: yes Reduction method: traction and counter traction and scapular manipulation Reduction successful: yes X-ray confirmed reduction: yes Immobilization: sling Post-procedure neurovascular assessment: post-procedure neurovascularly intact       Linward Foster, MD 06/20/22 1831    Gloris Manchester, MD 06/20/22 2140

## 2022-06-20 NOTE — Progress Notes (Signed)
Orthopedic Tech Progress Note Patient Details:  [name] [birth year] [id number]  Ortho Devices Type of Ortho Device: Shoulder immobilizer Ortho Device/Splint Location: LUE Ortho Device/Splint Interventions: Application   Post Interventions Patient Tolerated: Well  [name] 06/20/2022, 6:31 PM

## 2022-07-30 NOTE — Congregational Nurse Program (Signed)
  Dept: [phone]   Congregational Nurse Program Note  Date of Encounter: 07/30/2022  Past Medical History: Past Medical History:  Diagnosis Date   Anemia    Blood transfusion    2010 s/p SVD   Chronic back pain    Depression    not on meds   GERD (gastroesophageal reflux disease)    Mental disorder    Pregnancy induced hypertension 2010   required Mag Sulfate   Preterm labor    first pregnancy/current pregnancy   UTI (urinary tract infection) in pregnancy, antepartum    09/2011 tx with abx    Encounter Details:  CNP Questionnaire - 07/28/22 1700       Questionnaire   Ask client: Do you give verbal consent for me to treat you today? Yes    Student Assistance N/A    Location Patient Served  [facility]    Visit Setting with Client Organization    Patient Status Unhoused    Insurance [facility]    Insurance/Financial Assistance Referral N/A    Medical Provider No    Screening Referrals Made Annual Wellness Visit    Medical Referrals Made N/A    Medical Appointment Made N/A    Recently w/o PCP, now 1st time PCP visit completed due to CNs referral or appointment made N/A    Food Have Food Insecurities    Transportation Need transportation assistance    Housing/Utilities No permanent housing    Interpersonal Safety N/A    Interventions Advocate/Support;Case Management;Counsel    Abnormal to Normal Screening Since Last CN Visit N/A    Screenings CN Performed N/A    Sent Client to Lab for: N/A    Did client attend any of the following based off CNs referral or appointments made? N/A    ED Visit Averted N/A    Life-Saving Intervention Made N/A             Spoke with client briefly. Says her shoulder is feeling much better. Still needs to get PCP appt. Follow-up prn.  Volney Presser, RN, BSN

## 2023-04-16 ENCOUNTER — Encounter (HOSPITAL_COMMUNITY): Payer: Self-pay

## 2023-04-16 ENCOUNTER — Emergency Department (HOSPITAL_COMMUNITY)
Admission: EM | Admit: 2023-04-16 | Discharge: 2023-04-16 | Disposition: A | Discharge status: Home / Self Care | Payer: 59 | Attending: Emergency Medicine | Admitting: Emergency Medicine

## 2023-04-16 ENCOUNTER — Emergency Department (HOSPITAL_COMMUNITY): Payer: 59

## 2023-04-16 ENCOUNTER — Other Ambulatory Visit: Payer: Self-pay

## 2023-04-16 DIAGNOSIS — S43015A Anterior dislocation of left humerus, initial encounter: Secondary | ICD-10-CM | POA: Diagnosis not present

## 2023-04-16 DIAGNOSIS — S4992XA Unspecified injury of left shoulder and upper arm, initial encounter: Secondary | ICD-10-CM | POA: Diagnosis not present

## 2023-04-16 DIAGNOSIS — X509XXA Other and unspecified overexertion or strenuous movements or postures, initial encounter: Secondary | ICD-10-CM | POA: Insufficient documentation

## 2023-04-16 DIAGNOSIS — M25512 Pain in left shoulder: Secondary | ICD-10-CM | POA: Diagnosis not present

## 2023-04-16 DIAGNOSIS — W19XXXA Unspecified fall, initial encounter: Secondary | ICD-10-CM | POA: Diagnosis not present

## 2023-04-16 DIAGNOSIS — M25519 Pain in unspecified shoulder: Secondary | ICD-10-CM | POA: Diagnosis not present

## 2023-04-16 DIAGNOSIS — S43005A Unspecified dislocation of left shoulder joint, initial encounter: Secondary | ICD-10-CM | POA: Insufficient documentation

## 2023-04-16 DIAGNOSIS — S43085A Other dislocation of left shoulder joint, initial encounter: Secondary | ICD-10-CM | POA: Diagnosis not present

## 2023-04-16 MED ORDER — OXYCODONE HCL 5 MG PO TABS
5.0000 mg | ORAL_TABLET | Freq: Once | ORAL | Status: AC
  Administered 2023-04-16: 5 mg via ORAL
  Filled 2023-04-16: qty 1

## 2023-04-16 MED ORDER — ACETAMINOPHEN 500 MG PO TABS
1000.0000 mg | ORAL_TABLET | Freq: Once | ORAL | Status: AC
  Administered 2023-04-16: 1000 mg via ORAL
  Filled 2023-04-16: qty 2

## 2023-04-16 NOTE — ED Notes (Signed)
..  The patient is A&OX4, ambulatory at d/c with independent steady gait, NAD. Pt verbalized understanding of d/c instructions and follow up care.

## 2023-04-16 NOTE — ED Triage Notes (Signed)
Pt bibgcems from home pt trying to close window and felt her left shoulder pop. Pt c/o 7/10 pain. 110/70 70hr  99% RA

## 2023-04-16 NOTE — ED Provider Notes (Signed)
[place] EMERGENCY DEPARTMENT AT [facility] Provider Note   CSN: [id number] Arrival date & time: 04/16/23  0045     History  Chief Complaint  Patient presents with   Shoulder Injury    [name] is a 32 y.o. female.  32 yo F with a chief complaint of left shoulder pain.  She tells me that she was trying to close a window and she felt her left shoulder dislocate.  This would be the fourth time that has happened to her.  She was riding with the EMS stretcher and she felt it suddenly reduced.  He feels quite a bit better than it did before but is a bit sore still.  She denies trauma otherwise.   Shoulder Injury       Home Medications Prior to Admission medications   Medication Sig Start Date End Date Taking? Authorizing Provider  acetaminophen (TYLENOL) 325 MG tablet Take 2 tablets (650 mg total) by mouth every 6 (six) hours as needed. Patient taking differently: Take 1,000 mg by mouth every 6 (six) hours as needed for moderate pain. 05/29/19   LampteyBritta Mccreedy, MD      Allergies    Patient has no known allergies.    Review of Systems   Review of Systems  Physical Exam Updated Vital Signs BP (!) 125/99 (BP Location: Right Arm)   Pulse 77   Temp 97.9 F (36.6 C) (Oral)   Ht 5\' 7"  (1.702 m)   Wt 77.1 kg   LMP 04/13/2023 (Approximate)   SpO2 100%   BMI 26.63 kg/m  Physical Exam Vitals and nursing note reviewed.  Constitutional:      General: She is not in acute distress.    Appearance: She is well-developed. She is not diaphoretic.  HENT:     Head: Normocephalic and atraumatic.  Eyes:     Pupils: Pupils are equal, round, and reactive to light.  Cardiovascular:     Rate and Rhythm: Normal rate and regular rhythm.     Heart sounds: No murmur heard.    No friction rub. No gallop.  Pulmonary:     Effort: Pulmonary effort is normal.     Breath sounds: No wheezing or rales.  Abdominal:     General: There is no distension.     Palpations:  Abdomen is soft.     Tenderness: There is no abdominal tenderness.  Musculoskeletal:        General: No tenderness.     Cervical back: Normal range of motion and neck supple.     Comments: Pulse motor and sensation intact to the left upper extremity.  Skin:    General: Skin is warm and dry.  Neurological:     Mental Status: She is alert and oriented to person, place, and time.  Psychiatric:        Behavior: Behavior normal.     ED Results / Procedures / Treatments   Labs (all labs ordered are listed, but only abnormal results are displayed) Labs Reviewed - No data to display  EKG None  Radiology No results found.  Procedures Procedures    Medications Ordered in ED Medications  acetaminophen (TYLENOL) tablet 1,000 mg (1,000 mg Oral Given 04/16/23 0126)  oxyCODONE (Oxy IR/ROXICODONE) immediate release tablet 5 mg (5 mg Oral Given 04/16/23 0126)    ED Course/ Medical Decision Making/ A&P  Medical Decision Making Amount and/or Complexity of Data Reviewed Radiology: ordered.  Risk OTC drugs. Prescription drug management.   32 yo F with a chief complaints of left shoulder pain.  Patient's had a history of dislocation on that side.  She tells me she is dislocated 4 times in the past.  She thinks she dislocated it today with minimal use.  It seems that it may be reduced itself en route with EMS.  Will obtain a plain film here.  Plain film independently interpreted by me without dislocation or fracture.  Sling for comfort.  Ortho follow up.  1:31 AM:  I have discussed the diagnosis/risks/treatment options with the patient.  Evaluation and diagnostic testing in the emergency department does not suggest an emergent condition requiring admission or immediate intervention beyond what has been performed at this time.  They will follow up with Ortho. We also discussed returning to the ED immediately if new or worsening sx occur. We discussed the sx  which are most concerning (e.g., sudden worsening pain, fever, inability to tolerate by mouth) that necessitate immediate return. Medications administered to the patient during their visit and any new prescriptions provided to the patient are listed below.  Medications given during this visit Medications  acetaminophen (TYLENOL) tablet 1,000 mg (1,000 mg Oral Given 04/16/23 0126)  oxyCODONE (Oxy IR/ROXICODONE) immediate release tablet 5 mg (5 mg Oral Given 04/16/23 0126)     The patient appears reasonably screen and/or stabilized for discharge and I doubt any other medical condition or other [facility] requiring further screening, evaluation, or treatment in the ED at this time prior to discharge.          Final Clinical Impression(s) / ED Diagnoses Final diagnoses:  Dislocation of left shoulder joint, initial encounter    Rx / DC Orders ED Discharge Orders     None         Melene Plan, DO 04/16/23 0131

## 2023-04-16 NOTE — Discharge Instructions (Signed)
Looks like the shoulder is back in place.  As we discussed please follow-up with orthopedics in the office.  I have given you a sling for comfort you do need to take your arm out of the sling at least 4 times a day and perform range of motion exercises.  Take 4 over the counter ibuprofen tablets 3 times a day or 2 over-the-counter naproxen tablets twice a day for pain. Also take tylenol 1000mg (2 extra strength) four times a day.

## 2023-04-21 ENCOUNTER — Encounter (HOSPITAL_COMMUNITY): Payer: Self-pay

## 2023-04-21 ENCOUNTER — Ambulatory Visit (HOSPITAL_COMMUNITY)
Admission: EM | Admit: 2023-04-21 | Discharge: 2023-04-21 | Disposition: A | Discharge status: Home / Self Care | Payer: 59 | Attending: Family Medicine | Admitting: Family Medicine

## 2023-04-21 DIAGNOSIS — B9689 Other specified bacterial agents as the cause of diseases classified elsewhere: Secondary | ICD-10-CM | POA: Diagnosis not present

## 2023-04-21 DIAGNOSIS — Z202 Contact with and (suspected) exposure to infections with a predominantly sexual mode of transmission: Secondary | ICD-10-CM | POA: Diagnosis not present

## 2023-04-21 DIAGNOSIS — A5901 Trichomonal vulvovaginitis: Secondary | ICD-10-CM | POA: Diagnosis not present

## 2023-04-21 DIAGNOSIS — N760 Acute vaginitis: Secondary | ICD-10-CM | POA: Diagnosis not present

## 2023-04-21 MED ORDER — METRONIDAZOLE 500 MG PO TABS
ORAL_TABLET | ORAL | Status: AC
  Filled 2023-04-21: qty 4

## 2023-04-21 MED ORDER — METRONIDAZOLE 500 MG PO TABS
2000.0000 mg | ORAL_TABLET | Freq: Once | ORAL | Status: AC
  Administered 2023-04-21: 2000 mg via ORAL

## 2023-04-21 NOTE — Discharge Instructions (Signed)
You have been given the following today for treatment of suspected trichomonas: Meds ordered this encounter  Medications   metroNIDAZOLE (FLAGYL) tablet 2,000 mg  We have also sent testing for sexually transmitted infections. We will notify you of any positive results once they are received. If required, we will prescribe any medications you might need.  Please refrain from all sexual activity for at least the next seven days.

## 2023-04-21 NOTE — ED Provider Notes (Addendum)
[facility] CARE CENTER   [id number] 04/21/23 Arrival Time: 4098  ASSESSMENT & PLAN:  1. Possible exposure to STD       Discharge Instructions      You have been given the following today for treatment of suspected trichomonas: Meds ordered this encounter  Medications   metroNIDAZOLE (FLAGYL) tablet 2,000 mg  We have also sent testing for sexually transmitted infections. We will notify you of any positive results once they are received. If required, we will prescribe any medications you might need.  Please refrain from all sexual activity for at least the next seven days.     Without s/s of PID.  Labs Reviewed  CERVICOVAGINAL ANCILLARY ONLY   Declines HIV/RPR. Changes mind and does want now.  When nurse when to draw blood she declines again.  Will notify of any positive results. Instructed to refrain from sexual activity for at least seven days.  Reviewed expectations re: course of current medical issues. Questions answered. Outlined signs and symptoms indicating need for more acute intervention. Patient verbalized understanding. After Visit Summary given.   SUBJECTIVE:  [name] is a 32 y.o. female who presents with possible STD exp; trich; No specific symptoms. No specific aggravating or alleviating factors reported. Denies: urinary frequency, dysuria, and gross hematuria. Afebrile. No abdominal or pelvic pain. Normal PO intake wihout n/v. No genital rashes or lesions. Reports that she is sexually active with single female partner.   Patient's last menstrual period was 04/13/2023 (approximate).   OBJECTIVE:  Vitals:   04/21/23 1006  BP: 119/73  Pulse: 86  Resp: 16  Temp: 98.7 F (37.1 C)  TempSrc: Oral  SpO2: 98%     General appearance: alert, cooperative, appears stated age and no distress Lungs: unlabored respirations; speaks full sentences without difficulty Back: no CVA tenderness; FROM at waist Abdomen: soft, non-tender GU:  deferred Skin: warm and dry Psychological: alert and cooperative; normal mood and affect.    Labs Reviewed  CERVICOVAGINAL ANCILLARY ONLY    No Known Allergies  Past Medical History:  Diagnosis Date   Anemia    Blood transfusion    2010 s/p SVD   Chronic back pain    Depression    not on meds   GERD (gastroesophageal reflux disease)    Mental disorder    Pregnancy induced hypertension 2010   required Mag Sulfate   Preterm labor    first pregnancy/current pregnancy   UTI (urinary tract infection) in pregnancy, antepartum    09/2011 tx with abx   Family History  Problem Relation Age of Onset   Asthma Mother    Cancer Mother        colon   Diabetes Father    Hypertension Father    Mental illness Brother        bipolar, schizophrenia   Anesthesia problems Neg Hx    Social History   Socioeconomic History   Marital status: Single    Spouse name: Not on file   Number of children: Not on file   Years of education: Not on file   Highest education level: Not on file  Occupational History   Not on file  Tobacco Use   Smoking status: Every Day    Current packs/day: 0.50    Types: Cigarettes   Smokeless tobacco: Never  Vaping Use   Vaping status: Never Used  Substance and Sexual Activity   Alcohol use: No    Comment: socail   Drug use: No  Sexual activity: Yes    Birth control/protection: None  Other Topics Concern   Not on file  Social History Narrative   Not on file   Social Determinants of Health   Financial Resource Strain: Not on file  Food Insecurity: Not on file  Transportation Needs: Not on file  Physical Activity: Not on file  Stress: Not on file  Social Connections: Not on file  Intimate Partner Violence: Not on file           Mardella Layman, MD 04/21/23 1035    Mardella Layman, MD 04/21/23 1043    Mardella Layman, MD 04/21/23 1054

## 2023-04-21 NOTE — ED Triage Notes (Signed)
Pt presents to the office for vaginal discharge. Pt reports her partner has Trich. Pt denies any symptoms at this time.

## 2023-04-22 ENCOUNTER — Telehealth: Payer: Self-pay

## 2023-04-22 LAB — CERVICOVAGINAL ANCILLARY ONLY
Bacterial Vaginitis (gardnerella): POSITIVE — AB
Candida Glabrata: NEGATIVE
Candida Vaginitis: NEGATIVE
Chlamydia: POSITIVE — AB
Comment: NEGATIVE
Comment: NEGATIVE
Comment: NEGATIVE
Comment: NEGATIVE
Comment: NEGATIVE
Comment: NORMAL
Neisseria Gonorrhea: NEGATIVE
Trichomonas: POSITIVE — AB

## 2023-04-22 MED ORDER — DOXYCYCLINE HYCLATE 100 MG PO CAPS: 100.0000 mg | ORAL_CAPSULE | Freq: Two times a day (BID) | ORAL | 0 refills | Status: AC

## 2023-04-22 MED ORDER — METRONIDAZOLE 500 MG PO TABS: 500.0000 mg | ORAL_TABLET | Freq: Two times a day (BID) | ORAL | 0 refills | Status: DC

## 2023-06-22 ENCOUNTER — Emergency Department (HOSPITAL_BASED_OUTPATIENT_CLINIC_OR_DEPARTMENT_OTHER)
Admission: EM | Admit: 2023-06-22 | Discharge: 2023-06-22 | Disposition: A | Discharge status: Home / Self Care | Payer: 59 | Attending: Emergency Medicine | Admitting: Emergency Medicine

## 2023-06-22 ENCOUNTER — Encounter (HOSPITAL_BASED_OUTPATIENT_CLINIC_OR_DEPARTMENT_OTHER): Payer: Self-pay

## 2023-06-22 ENCOUNTER — Emergency Department (HOSPITAL_COMMUNITY): Admission: EM | Admit: 2023-06-22 | Discharge: 2023-06-22 | Discharge status: Against Medical Advice | Payer: 59

## 2023-06-22 ENCOUNTER — Other Ambulatory Visit: Payer: Self-pay

## 2023-06-22 ENCOUNTER — Emergency Department (HOSPITAL_BASED_OUTPATIENT_CLINIC_OR_DEPARTMENT_OTHER): Payer: 59

## 2023-06-22 DIAGNOSIS — R1031 Right lower quadrant pain: Secondary | ICD-10-CM | POA: Insufficient documentation

## 2023-06-22 DIAGNOSIS — R112 Nausea with vomiting, unspecified: Secondary | ICD-10-CM | POA: Insufficient documentation

## 2023-06-22 DIAGNOSIS — R109 Unspecified abdominal pain: Secondary | ICD-10-CM

## 2023-06-22 DIAGNOSIS — F17210 Nicotine dependence, cigarettes, uncomplicated: Secondary | ICD-10-CM | POA: Diagnosis not present

## 2023-06-22 DIAGNOSIS — M545 Low back pain, unspecified: Secondary | ICD-10-CM | POA: Insufficient documentation

## 2023-06-22 LAB — URINALYSIS, ROUTINE W REFLEX MICROSCOPIC
Bacteria, UA: NONE SEEN
Bilirubin Urine: NEGATIVE
Glucose, UA: NEGATIVE mg/dL
Hgb urine dipstick: NEGATIVE
Nitrite: NEGATIVE
Protein, ur: NEGATIVE mg/dL
Specific Gravity, Urine: 1.013 (ref 1.005–1.030)
pH: 5 (ref 5.0–8.0)

## 2023-06-22 LAB — COMPREHENSIVE METABOLIC PANEL
ALT: 10 U/L (ref 0–44)
AST: 13 U/L — ABNORMAL LOW (ref 15–41)
Albumin: 4.6 g/dL (ref 3.5–5.0)
Alkaline Phosphatase: 47 U/L (ref 38–126)
Anion gap: 12 (ref 5–15)
BUN: 6 mg/dL (ref 6–20)
CO2: 19 mmol/L — ABNORMAL LOW (ref 22–32)
Calcium: 9.7 mg/dL (ref 8.9–10.3)
Chloride: 106 mmol/L (ref 98–111)
Creatinine, Ser: 0.83 mg/dL (ref 0.44–1.00)
GFR, Estimated: >60 mL/min (ref >60)
Glucose, Bld: 108 mg/dL — ABNORMAL HIGH (ref 70–99)
Potassium: 3.6 mmol/L (ref 3.5–5.1)
Sodium: 137 mmol/L (ref 135–145)
Total Bilirubin: 0.4 mg/dL (ref <1.2)
Total Protein: 8.4 g/dL — ABNORMAL HIGH (ref 6.5–8.1)

## 2023-06-22 LAB — CBC
HCT: 31.9 % — ABNORMAL LOW (ref 36.0–46.0)
Hemoglobin: 9.5 g/dL — ABNORMAL LOW (ref 12.0–15.0)
MCH: 20 pg — ABNORMAL LOW (ref 26.0–34.0)
MCHC: 29.8 g/dL — ABNORMAL LOW (ref 30.0–36.0)
MCV: 67.2 fL — ABNORMAL LOW (ref 80.0–100.0)
Platelets: 424 10*3/uL — ABNORMAL HIGH (ref 150–400)
RBC: 4.75 MIL/uL (ref 3.87–5.11)
RDW: 20.4 % — ABNORMAL HIGH (ref 11.5–15.5)
WBC: 8.6 10*3/uL (ref 4.0–10.5)
nRBC: 0 % (ref 0.0–0.2)

## 2023-06-22 LAB — LIPASE, BLOOD: Lipase: 17 U/L (ref 11–51)

## 2023-06-22 LAB — PREGNANCY, URINE: Preg Test, Ur: NEGATIVE

## 2023-06-22 MED ORDER — MORPHINE SULFATE (PF) 4 MG/ML IV SOLN
4.0000 mg | Freq: Once | INTRAVENOUS | Status: AC
  Administered 2023-06-22: 4 mg via INTRAVENOUS
  Filled 2023-06-22: qty 1

## 2023-06-22 MED ORDER — IOHEXOL 300 MG/ML  SOLN
100.0000 mL | Freq: Once | INTRAMUSCULAR | Status: AC | PRN
  Administered 2023-06-22: 85 mL via INTRAVENOUS

## 2023-06-22 MED ORDER — ONDANSETRON HCL 4 MG/2ML IJ SOLN
4.0000 mg | Freq: Once | INTRAMUSCULAR | Status: AC
  Administered 2023-06-22: 4 mg via INTRAVENOUS
  Filled 2023-06-22: qty 2

## 2023-06-22 MED ORDER — SODIUM CHLORIDE 0.9 % IV BOLUS
1000.0000 mL | Freq: Once | INTRAVENOUS | Status: AC
  Administered 2023-06-22: 1000 mL via INTRAVENOUS

## 2023-06-22 NOTE — ED Notes (Signed)
Patient refusing pelvic exam. States "I can go to health department for that". MD updated

## 2023-06-22 NOTE — ED Triage Notes (Signed)
Complaining of a burning pain in the lower back and in the lower abdomen. Has been nausous. Started earlier today but has become severe the last 1 and 1/2 hours.

## 2023-06-22 NOTE — ED Notes (Signed)
ED Provider at bedside. 

## 2023-06-22 NOTE — ED Provider Notes (Signed)
[facility] EMERGENCY [facility] Emergency Department Provider Note MRN:  [id number]  Arrival date & time: 06/22/23     Chief Complaint   Abdominal Pain   History of Present Illness   [name] is a 32 y.o. year-old female with no pertinent past medical history presenting to the ED with chief complaint of abdominal pain.  Right lower quadrant abdominal pain with occasional right right flank and right lower back pain all day today, associated with nausea vomiting.  Review of Systems  A thorough review of systems was obtained and all systems are negative except as noted in the HPI and PMH.   Patient's Health History    Past Medical History:  Diagnosis Date   Anemia    Blood transfusion    2010 s/p SVD   Chronic back pain    Depression    not on meds   GERD (gastroesophageal reflux disease)    Mental disorder    Pregnancy induced hypertension 2010   required Mag Sulfate   Preterm labor    first pregnancy/current pregnancy   UTI (urinary tract infection) in pregnancy, antepartum    09/2011 tx with abx    Past Surgical History:  Procedure Laterality Date   FINGER SURGERY     right thumb in childhood    Family History  Problem Relation Age of Onset   Asthma Mother    Cancer Mother        colon   Diabetes Father    Hypertension Father    Mental illness Brother        bipolar, schizophrenia   Anesthesia problems Neg Hx     Social History   Socioeconomic History   Marital status: Single    Spouse name: Not on file   Number of children: Not on file   Years of education: Not on file   Highest education level: Not on file  Occupational History   Not on file  Tobacco Use   Smoking status: Every Day    Current packs/day: 0.50    Types: Cigarettes   Smokeless tobacco: Never  Vaping Use   Vaping status: Never Used  Substance and Sexual Activity   Alcohol use: No    Comment: socail   Drug use: No   Sexual activity: Yes    Birth control/protection:  None  Other Topics Concern   Not on file  Social History Narrative   Not on file   Social Determinants of Health   Financial Resource Strain: Not on file  Food Insecurity: Not on file  Transportation Needs: Not on file  Physical Activity: Not on file  Stress: Not on file  Social Connections: Not on file  Intimate Partner Violence: Not on file     Physical Exam   Vitals:   06/22/23 0500 06/22/23 0600  BP: 137/81 122/83  Pulse: (!) 106 88  Resp:    Temp:    SpO2: 98% 100%    CONSTITUTIONAL: Well-appearing, NAD NEURO/PSYCH:  Alert and oriented x 3, no focal deficits EYES:  eyes equal and reactive ENT/NECK:  no LAD, no JVD CARDIO: Tachycardic rate, well-perfused, normal S1 and S2 PULM:  CTAB no wheezing or rhonchi GI/GU:  non-distended, moderate tenderness MSK/SPINE:  No gross deformities, no edema SKIN:  no rash, atraumatic   *Additional and/or pertinent findings included in MDM below  Diagnostic and Interventional Summary    EKG Interpretation Date/Time:    Ventricular Rate:    PR Interval:    QRS  Duration:    QT Interval:    QTC Calculation:   R Axis:      Text Interpretation:         Labs Reviewed  COMPREHENSIVE METABOLIC PANEL - Abnormal; Notable for the following components:      Result Value   CO2 19 (*)    Glucose, Bld 108 (*)    Total Protein 8.4 (*)    AST 13 (*)    All other components within normal limits  CBC - Abnormal; Notable for the following components:   Hemoglobin 9.5 (*)    HCT 31.9 (*)    MCV 67.2 (*)    MCH 20.0 (*)    MCHC 29.8 (*)    RDW 20.4 (*)    Platelets 424 (*)    All other components within normal limits  URINALYSIS, ROUTINE W REFLEX MICROSCOPIC - Abnormal; Notable for the following components:   Ketones, ur TRACE (*)    Leukocytes,Ua SMALL (*)    All other components within normal limits  WET PREP, GENITAL  LIPASE, BLOOD  PREGNANCY, URINE  GC/CHLAMYDIA PROBE AMP ([place]) NOT AT [facility]    CT ABDOMEN  PELVIS W CONTRAST  Final Result      Medications  morphine (PF) 4 MG/ML injection 4 mg (4 mg Intravenous Given 06/22/23 0427)  ondansetron (ZOFRAN) injection 4 mg (4 mg Intravenous Given 06/22/23 0425)  sodium chloride 0.9 % bolus 1,000 mL (0 mLs Intravenous Stopped 06/22/23 0527)  iohexol (OMNIPAQUE) 300 MG/ML solution 100 mL (85 mLs Intravenous Contrast Given 06/22/23 0432)     Procedures  /  Critical Care Procedures  ED Course and Medical Decision Making  Initial Impression and Ddx Suspicious for appendicitis versus pyelonephritis.  Past medical/surgical history that increases complexity of ED encounter: None  Interpretation of Diagnostics I personally reviewed the laboratory assessment and my interpretation is as follows: No significant blood count or electrolyte disturbance  CT unremarkable  Patient Reassessment and Ultimate Disposition/Management     With no explanation of patient's pain, elevated oral temperature there was lingering concern for PID.  Has previous ED visit during when she was treated for gonorrhea.  I discussed with patient the need for pelvic exam to evaluate for PID.  She became upset by this, mostly because it triggered an argument with her significant other in the room.  I explained that this was an emergent condition and still she decided to leave the emergency department AGAINST MEDICAL ADVICE.  She left in a hurry before I could talk with her and offer her empiric treatment for STDs or possibly PID.  Patient management required discussion with the following services or consulting groups:  None  Complexity of Problems Addressed Acute illness or injury that poses threat of life of bodily function  Additional Data Reviewed and Analyzed Further history obtained from: Further history from spouse/family member  Additional Factors Impacting ED Encounter Risk Use of parenteral controlled substances  Elmer Sow. Pilar Plate, MD [facility] Emergency  Medicine [facility] [email or web address]  Final Clinical Impressions(s) / ED Diagnoses     ICD-10-CM   1. Abdominal pain, unspecified abdominal location  R10.9       ED Discharge Orders     None        Discharge Instructions Discussed with and Provided to Patient:   Discharge Instructions   None      Sabas Sous, MD 06/22/23 [phone]

## 2023-06-22 NOTE — ED Notes (Signed)
ED Provider at bedside, speaking to patient about importance of need for pelvic exam. Patient continues to refuse. Female visitor at bedside stating "no man is looking at my girls goodies".  Patient requesting to leave AMA

## 2023-09-28 ENCOUNTER — Encounter (HOSPITAL_COMMUNITY): Payer: Self-pay | Admitting: Obstetrics and Gynecology

## 2023-09-28 ENCOUNTER — Inpatient Hospital Stay (HOSPITAL_COMMUNITY)
Admission: AD | Admit: 2023-09-28 | Discharge: 2023-09-28 | Disposition: A | Discharge status: Home / Self Care | Attending: Family Medicine | Admitting: Family Medicine

## 2023-09-28 DIAGNOSIS — O219 Vomiting of pregnancy, unspecified: Secondary | ICD-10-CM | POA: Diagnosis not present

## 2023-09-28 DIAGNOSIS — Z3201 Encounter for pregnancy test, result positive: Secondary | ICD-10-CM | POA: Insufficient documentation

## 2023-09-28 DIAGNOSIS — R110 Nausea: Secondary | ICD-10-CM | POA: Diagnosis not present

## 2023-09-28 DIAGNOSIS — Z3A01 Less than 8 weeks gestation of pregnancy: Secondary | ICD-10-CM | POA: Insufficient documentation

## 2023-09-28 DIAGNOSIS — Z3490 Encounter for supervision of normal pregnancy, unspecified, unspecified trimester: Secondary | ICD-10-CM

## 2023-09-28 LAB — URINALYSIS, ROUTINE W REFLEX MICROSCOPIC
Bilirubin Urine: NEGATIVE
Glucose, UA: NEGATIVE mg/dL
Hgb urine dipstick: NEGATIVE
Ketones, ur: NEGATIVE mg/dL
Leukocytes,Ua: NEGATIVE
Nitrite: NEGATIVE
Protein, ur: NEGATIVE mg/dL
Specific Gravity, Urine: 1.025 (ref 1.005–1.030)
pH: 5 (ref 5.0–8.0)

## 2023-09-28 LAB — POCT PREGNANCY, URINE: Preg Test, Ur: POSITIVE — AB

## 2023-09-28 MED ORDER — ONDANSETRON 4 MG PO TBDP
8.0000 mg | ORAL_TABLET | Freq: Once | ORAL | Status: AC
  Administered 2023-09-28: 8 mg via ORAL
  Filled 2023-09-28: qty 2

## 2023-09-28 MED ORDER — ONDANSETRON 4 MG PO TBDP: 4.0000 mg | ORAL_TABLET | Freq: Four times a day (QID) | ORAL | 0 refills | Status: DC | PRN

## 2023-09-28 NOTE — MAU Note (Signed)
 Gwendloyn KARSYN JAMIE is a 33 y.o. at Unknown here in MAU reporting: +HPT a few days ago.  Has not had preg confirmed. Mainly having Nausea, has thrown up maybe twice. Denies pain or bleeding. No d/c or irritation.  LMP: 2/1 Onset of complaint: nausea for a wk Pain score: none Vitals:   09/28/23 1007  BP: 124/75  Pulse: 97  Resp: 16  Temp: 98.6 F (37 C)  SpO2: 100%      Lab orders placed from triage:  upt/UA

## 2023-09-28 NOTE — MAU Note (Addendum)
 Chief Complaint: Nausea and Possible Pregnancy   Event Date/Time   First Provider Initiated Contact with Patient 09/28/23 1028      SUBJECTIVE HPI: [name] is a 33 y.o. [id number] at [redacted]w[redacted]d by LMP who presents to maternity admissions reporting that she took a home pregnancy test and it was positive. She is here requesting confirmation. Patient reports she is experiencing nausea, but no vomiting. She denies vaginal bleeding, vaginal itching/burning, urinary symptoms.   FOB is present and provided supporting history.  Patient denies any pain.    Possible Pregnancy Associated symptoms include nausea. Pertinent negatives include no vomiting.    Past Medical History:  Diagnosis Date   Anemia    Blood transfusion    2010 s/p SVD   Chronic back pain    Depression    not on meds   GERD (gastroesophageal reflux disease)    Mental disorder    Pregnancy induced hypertension 2010   required Mag Sulfate   Preterm labor    first pregnancy/current pregnancy   UTI (urinary tract infection) in pregnancy, antepartum    09/2011 tx with abx   Past Surgical History:  Procedure Laterality Date   FINGER SURGERY     right thumb in childhood   Social History   Socioeconomic History   Marital status: Single    Spouse name: Not on file   Number of children: Not on file   Years of education: Not on file   Highest education level: Not on file  Occupational History   Not on file  Tobacco Use   Smoking status: Former    Types: Cigarettes   Smokeless tobacco: Never  Vaping Use   Vaping status: Never Used  Substance and Sexual Activity   Alcohol use: Yes    Comment: occ   Drug use: No   Sexual activity: Yes    Birth control/protection: None  Other Topics Concern   Not on file  Social History Narrative   Not on file   Social Drivers of Health   Financial Resource Strain: Not on file  Food Insecurity: Not on file  Transportation Needs: Not on file  Physical Activity: Not on  file  Stress: Not on file  Social Connections: Not on file  Intimate Partner Violence: Not on file   No current facility-administered medications on file prior to encounter.   Current Outpatient Medications on File Prior to Encounter  Medication Sig Dispense Refill   acetaminophen (TYLENOL) 325 MG tablet Take 2 tablets (650 mg total) by mouth every 6 (six) hours as needed. (Patient taking differently: Take 1,000 mg by mouth every 6 (six) hours as needed for moderate pain (pain score 4-6).)     metroNIDAZOLE (FLAGYL) 500 MG tablet Take 1 tablet (500 mg total) by mouth 2 (two) times daily. 14 tablet 0   No Known Allergies  ROS:  Review of Systems  Gastrointestinal:  Positive for nausea. Negative for vomiting.  Genitourinary:  Negative for difficulty urinating, dysuria, urgency and vaginal bleeding.     I have reviewed patient's Past Medical Hx, Surgical Hx, Family Hx, Social Hx, medications and allergies.   Physical Exam  Patient Vitals for the past 24 hrs:  BP Temp Temp src Pulse Resp SpO2 Height Weight  09/28/23 1007 124/75 98.6 F (37 C) Oral 97 16 100 % 5\' 7"  (1.702 m) 72.3 kg   Constitutional: Well-developed, well-nourished female in no acute distress.  Cardiovascular: normal rate Respiratory: normal effort GI: Abd soft, non-tender. Pos  BS x 4 MS: Extremities nontender, no edema, normal ROM Neurologic: Alert and oriented x 4.  GU: Neg CVAT.  PELVIC EXAM: deferred  Bimanual exam: deferred     MAU Management/MDM:  Moderate  Orders Placed This Encounter  Procedures   Urinalysis, Routine w reflex microscopic -Urine, Clean Catch   Pregnancy, urine POC    Meds ordered this encounter  Medications   ondansetron (ZOFRAN-ODT) disintegrating tablet 8 mg     LAB RESULTS- reviewed and interpreted the following lab results Results for orders placed or performed during the hospital encounter of 09/28/23 (from the past 24 hours)  Pregnancy, urine POC     Status: Abnormal    Collection Time: 09/28/23 10:15 AM  Result Value Ref Range   Preg Test, Ur POSITIVE (A) NEGATIVE   Pregnancy test is positive Urinalysis is negative  Nausea relieved by zofran    ASSESSMENT 1. Early stage of pregnancy   2. Nausea and vomiting during pregnancy   3. [redacted] weeks gestation of pregnancy      PLAN Patient desires [facility]. Message sent 09/28/23 by Federico Flake  to see if we can get her into the [facility] for OB intake and then initial prenatal appointment.  Ultrasound has been scheduled.  Prescription for zofran sent to pharmacy for PRN use of nausea  Encouraged to reach out to office if nausea is not improved Discharge home in stable condition   Future Appointments  Date Time Provider Department Center  09/30/2023 10:15 AM [facility] US2 [facility]  10/26/2023  9:15 AM [facility] INTAKE [facility]  11/02/2023  2:15 PM Conan Bowens, MD [facility]   Zenia Resides, Student-NP 09/28/2023  10:44 AM   Attestation of Supervision of Student:  I confirm that I have verified the information documented in the nurse practitioner student's note and that I have also personally reperformed the history, physical exam and all medical decision making activities.  I have verified that all services and findings are accurately documented in this student's note; and I agree with management and plan as outlined in the documentation. I have also made any necessary editorial changes.  Gen: well appearing, NAD HEENT: no scleral icterus CV: RR Lung: Normal WOB Ext: warm well perfused  Confirmed history and plan of care with patient. She agrees with plan and given opportunity to ask questions Federico Flake, MD Center for [facility], [facility] 09/28/2023 11:10 AM

## 2023-09-29 ENCOUNTER — Other Ambulatory Visit: Payer: Self-pay | Admitting: *Deleted

## 2023-09-29 DIAGNOSIS — O3680X0 Pregnancy with inconclusive fetal viability, not applicable or unspecified: Secondary | ICD-10-CM

## 2023-09-30 ENCOUNTER — Ambulatory Visit (INDEPENDENT_AMBULATORY_CARE_PROVIDER_SITE_OTHER)

## 2023-09-30 ENCOUNTER — Other Ambulatory Visit: Payer: Self-pay

## 2023-09-30 ENCOUNTER — Other Ambulatory Visit: Payer: Self-pay | Admitting: *Deleted

## 2023-09-30 DIAGNOSIS — Z3A01 Less than 8 weeks gestation of pregnancy: Secondary | ICD-10-CM | POA: Diagnosis not present

## 2023-09-30 DIAGNOSIS — O3680X0 Pregnancy with inconclusive fetal viability, not applicable or unspecified: Secondary | ICD-10-CM

## 2023-09-30 NOTE — Progress Notes (Signed)
 [place] results reviewed by Dr. Crissie Reese who recommends follow [place] in 7-10 days due to FHR of 95 BPM. Pt was informed of results and recommendation. Pt was also instructed to return to MAU of she develops heavy vaginal bleeding or abdominal pain. She voiced understanding and agreed to appt on 4/1 @ 2:35 pm as no appt available on 3/31.

## 2023-10-12 ENCOUNTER — Other Ambulatory Visit: Payer: Self-pay

## 2023-10-12 ENCOUNTER — Ambulatory Visit (INDEPENDENT_AMBULATORY_CARE_PROVIDER_SITE_OTHER)

## 2023-10-12 DIAGNOSIS — Z3491 Encounter for supervision of normal pregnancy, unspecified, first trimester: Secondary | ICD-10-CM

## 2023-10-12 DIAGNOSIS — Z3A08 8 weeks gestation of pregnancy: Secondary | ICD-10-CM

## 2023-10-12 DIAGNOSIS — O3680X0 Pregnancy with inconclusive fetal viability, not applicable or unspecified: Secondary | ICD-10-CM

## 2023-10-12 NOTE — Progress Notes (Signed)
 Patient is scheduled today 10/12/2023 for a repeat viability ultrasound.

## 2023-10-26 ENCOUNTER — Other Ambulatory Visit (INDEPENDENT_AMBULATORY_CARE_PROVIDER_SITE_OTHER): Payer: Self-pay | Admitting: Family Medicine

## 2023-10-26 ENCOUNTER — Telehealth (INDEPENDENT_AMBULATORY_CARE_PROVIDER_SITE_OTHER)

## 2023-10-26 DIAGNOSIS — Z3A10 10 weeks gestation of pregnancy: Secondary | ICD-10-CM

## 2023-10-26 DIAGNOSIS — O0990 Supervision of high risk pregnancy, unspecified, unspecified trimester: Secondary | ICD-10-CM | POA: Insufficient documentation

## 2023-10-26 DIAGNOSIS — O0991 Supervision of high risk pregnancy, unspecified, first trimester: Secondary | ICD-10-CM

## 2023-10-26 NOTE — Patient Instructions (Signed)
 Safe Medications in Pregnancy   Acne:  Benzoyl Peroxide  Salicylic Acid   Backache/Headache:  Tylenol: 2 regular strength every 4 hours OR               2 Extra strength every 6 hours   Colds/Coughs/Allergies:  Benadryl (alcohol free) 25 mg every 6 hours as needed  Breath right strips  Claritin  Cepacol throat lozenges  Chloraseptic throat spray  Cold-Eeze- up to three times per day  Cough drops, alcohol free  Flonase (by prescription only)  Guaifenesin  Mucinex  Robitussin DM (plain only, alcohol free)  Saline nasal spray/drops  Sudafed (pseudoephedrine) & Actifed * use only after [redacted] weeks gestation and if you do not have high blood pressure  Tylenol  Vicks Vaporub  Zinc lozenges  Zyrtec   Constipation:  Colace  Ducolax suppositories  Fleet enema  Glycerin suppositories  Metamucil  Milk of magnesia  Miralax  Senokot  Smooth move tea   Diarrhea:  Kaopectate  Imodium A-D   *NO pepto Bismol   Hemorrhoids:  Anusol  Anusol HC  Preparation H  Tucks   Indigestion:  Tums  Maalox  Mylanta  Zantac  Pepcid   Insomnia:  Benadryl (alcohol free) 25mg  every 6 hours as needed  Tylenol PM  Unisom, no Gelcaps   Leg Cramps:  Tums  MagGel   Nausea/Vomiting:  Bonine  Dramamine  Emetrol  Ginger extract  Sea bands  Meclizine  Nausea medication to take during pregnancy:  Unisom (doxylamine succinate 25 mg tablets) Take one tablet daily at bedtime. If symptoms are not adequately controlled, the dose can be increased to a maximum recommended dose of two tablets daily (1/2 tablet in the morning, 1/2 tablet mid-afternoon and one at bedtime).  Vitamin B6 100mg  tablets. Take one tablet twice a day (up to 200 mg per day).   Skin Rashes:  Aveeno products  Benadryl cream or 25mg  every 6 hours as needed  Calamine Lotion  1% cortisone cream   Yeast infection:  Gyne-lotrimin 7  Monistat 7    **If taking multiple medications, please check labels to avoid  duplicating the same active ingredients  **take medication as directed on the label  ** Do not exceed 4000 mg of tylenol in 24 hours  **Do not take medications that contain aspirin or ibuprofen            Considering Waterbirth? Guide for patients at Center for [facility]) Why consider waterbirth? Gentle birth for babies  Less pain medicine used in labor  May allow for passive descent/less pushing  May reduce perineal tears  More mobility and instinctive maternal position changes  Increased maternal relaxation   Is waterbirth safe? What are the risks of infection, drowning or other complications? Infection:  Very low risk (3.7 % for tub vs 4.8% for bed)  7 in 8000 waterbirths with documented infection  Poorly cleaned equipment most common cause  Slightly lower group B strep transmission rate  Drowning  Maternal:  Very low risk  Related to seizures or fainting  Newborn:  Very low risk. No evidence of increased risk of respiratory problems in multiple large studies  Physiological protection from breathing under water  Avoid underwater birth if there are any fetal complications  Once baby's head is out of the water, keep it out.  Birth complication  Some reports of cord trauma, but risk decreased by bringing baby to surface gradually  No evidence of increased risk of shoulder dystocia. Mothers  can usually change positions faster in water than in a bed, possibly aiding the maneuvers to free the shoulder.   There are 2 things you MUST do to have a waterbirth with [facility]: Attend a waterbirth class at [facility]   3rd Wednesday of every month from 7-9 pm (virtual during COVID) [employer] at [email or web address] or [email or web address] or by calling [phone] Bring Korea the certificate from the class to your prenatal appointment or send via MyChart Meet with a midwife at 36 weeks* to see if you can still plan a  waterbirth and to sign the consent.   *We also recommend that you schedule as many of your prenatal visits with a midwife as possible.    Helpful information: You may want to bring a bathing suit top to the hospital to wear during labor but this is optional.  All other supplies are provided by the hospital. Please arrive at the hospital with signs of active labor, and do not wait at home until late in labor. It takes 45 min- 1 hour for fetal monitoring, and check in to your room to take place, plus transport and filling of the waterbirth tub.    Things that would prevent you from having a waterbirth: Premature, <37wks  Previous cesarean birth  Presence of thick meconium-stained fluid  Multiple gestation (Twins, triplets, etc.)  Uncontrolled diabetes or gestational diabetes requiring medication  Hypertension diagnosed in pregnancy or preexisting hypertension (gestational hypertension, preeclampsia, or chronic hypertension) Fetal growth restriction (your baby measures less than 10th percentile on ultrasound) Heavy vaginal bleeding  Non-reassuring fetal heart rate  Active infection (MRSA, etc.). Group B Strep is NOT a contraindication for waterbirth.  If your labor has to be induced and induction method requires continuous monitoring of the baby's heart rate  Other risks/issues identified by your obstetrical provider   Please remember that birth is unpredictable. Under certain unforeseeable circumstances your provider may advise against giving birth in the tub. These decisions will be made on a case-by-case basis and with the safety of you and your baby as our highest priority.

## 2023-10-26 NOTE — Progress Notes (Signed)
 New OB Intake  I connected with [name]  on 10/26/23 at  9:15 AM EDT by MyChart Video Visit and verified that I am speaking with the correct person using two identifiers. Nurse is located at [facility] and pt is located at home.  I discussed the limitations, risks, security and privacy concerns of performing an evaluation and management service by telephone and the availability of in person appointments. I also discussed with the patient that there may be a patient responsible charge related to this service. The patient expressed understanding and agreed to proceed.  I explained I am completing New OB Intake today. We discussed EDD of 05/20/2024, by Last Menstrual Period. Pt is [id number]. I reviewed her allergies, medications and Medical/Surgical/OB history.    Patient Active Problem List   Diagnosis Date Noted   Encounter for Depo-Provera contraception 06/15/2012   Pelvic pain in female 06/15/2012   NSVD (normal spontaneous vaginal delivery) 12/11/2011   High-risk pregnancy supervision 10/22/2011   History of preterm delivery, currently pregnant 10/22/2011   Dental caries 10/22/2011   Abdominal trauma 10/21/2011    Concerns addressed today  Delivery Plans Plans to deliver at [facility]. Discussed the nature of our practice with multiple providers including residents and students. Due to the size of the practice, the delivering provider may not be the same as those providing prenatal care.   Patient is interested in water birth. Offered upcoming OB visit with CNM to discuss further.  MyChart/Babyscripts MyChart access verified. I explained pt will have some visits in office and some virtually. Babyscripts instructions given and order placed. Patient verifies receipt of registration text/e-mail. Account successfully created and app downloaded. If patient is a candidate for Optimized scheduling, add to sticky note.   Blood Pressure Cuff/Weight Scale Patient has private insurance;  instructed to purchase blood pressure cuff and bring to first prenatal appt. Explained after first prenatal appt pt will check weekly and document in Babyscripts.  Anatomy US  Explained first scheduled US  will be around 19 weeks. Anatomy US  scheduled for 12/27/2023 at 8:00am.  Is patient a CenteringPregnancy candidate?  Declined    Is patient a Mom+Baby Combined Care candidate?  Not a candidate   If accepted, confirm patient does not intend to move from the area for at least 12 months, then notify Mom+Baby staff  Interested in [place]? If yes, send referral and doula dot phrase.   Is patient a candidate for Babyscripts Optimization?   First visit review I reviewed new OB appt with patient. Explained pt will be seen by Dr. Nolon Baxter, MD at first visit. Discussed Linard [place] genetic screening with patient. Panorama and Horizon.. Routine prenatal labs is needed at NOB visit.  Last Pap No results found for: "DIAGPAP"  Allene Ivan, CMA 10/26/2023  9:16 AM

## 2023-11-02 ENCOUNTER — Encounter: Payer: Self-pay | Admitting: Obstetrics and Gynecology

## 2023-11-02 ENCOUNTER — Encounter: Admitting: Obstetrics and Gynecology

## 2023-11-02 NOTE — Progress Notes (Signed)
 Patient did not keep her NOB visit today.  Larence Pleas, MD, [facility] Attending Center for [facility])

## 2023-11-22 ENCOUNTER — Inpatient Hospital Stay (HOSPITAL_COMMUNITY)
Admission: AD | Admit: 2023-11-22 | Discharge: 2023-11-22 | Disposition: A | Discharge status: Home / Self Care | Attending: Obstetrics & Gynecology | Admitting: Obstetrics & Gynecology

## 2023-11-22 ENCOUNTER — Inpatient Hospital Stay (HOSPITAL_BASED_OUTPATIENT_CLINIC_OR_DEPARTMENT_OTHER)

## 2023-11-22 ENCOUNTER — Other Ambulatory Visit: Payer: Self-pay

## 2023-11-22 ENCOUNTER — Encounter (HOSPITAL_COMMUNITY): Payer: Self-pay | Admitting: Obstetrics & Gynecology

## 2023-11-22 DIAGNOSIS — Z3A14 14 weeks gestation of pregnancy: Secondary | ICD-10-CM

## 2023-11-22 DIAGNOSIS — D649 Anemia, unspecified: Secondary | ICD-10-CM | POA: Insufficient documentation

## 2023-11-22 DIAGNOSIS — O4692 Antepartum hemorrhage, unspecified, second trimester: Secondary | ICD-10-CM | POA: Diagnosis not present

## 2023-11-22 DIAGNOSIS — O99012 Anemia complicating pregnancy, second trimester: Secondary | ICD-10-CM | POA: Diagnosis not present

## 2023-11-22 DIAGNOSIS — O98312 Other infections with a predominantly sexual mode of transmission complicating pregnancy, second trimester: Secondary | ICD-10-CM | POA: Diagnosis not present

## 2023-11-22 DIAGNOSIS — M549 Dorsalgia, unspecified: Secondary | ICD-10-CM

## 2023-11-22 DIAGNOSIS — O200 Threatened abortion: Secondary | ICD-10-CM

## 2023-11-22 DIAGNOSIS — O99891 Other specified diseases and conditions complicating pregnancy: Secondary | ICD-10-CM | POA: Diagnosis not present

## 2023-11-22 DIAGNOSIS — Z113 Encounter for screening for infections with a predominantly sexual mode of transmission: Secondary | ICD-10-CM | POA: Diagnosis present

## 2023-11-22 DIAGNOSIS — R1030 Lower abdominal pain, unspecified: Secondary | ICD-10-CM | POA: Diagnosis present

## 2023-11-22 DIAGNOSIS — M545 Low back pain, unspecified: Secondary | ICD-10-CM | POA: Diagnosis not present

## 2023-11-22 DIAGNOSIS — A5901 Trichomonal vulvovaginitis: Secondary | ICD-10-CM | POA: Diagnosis not present

## 2023-11-22 DIAGNOSIS — O26892 Other specified pregnancy related conditions, second trimester: Secondary | ICD-10-CM | POA: Diagnosis not present

## 2023-11-22 DIAGNOSIS — R1084 Generalized abdominal pain: Secondary | ICD-10-CM | POA: Diagnosis not present

## 2023-11-22 LAB — COMPREHENSIVE METABOLIC PANEL WITH GFR
ALT: 19 U/L (ref 0–44)
AST: 19 U/L (ref 15–41)
Albumin: 2.9 g/dL — ABNORMAL LOW (ref 3.5–5.0)
Alkaline Phosphatase: 49 U/L (ref 38–126)
Anion gap: 8 (ref 5–15)
BUN: 5 mg/dL — ABNORMAL LOW (ref 6–20)
CO2: 21 mmol/L — ABNORMAL LOW (ref 22–32)
Calcium: 9.3 mg/dL (ref 8.9–10.3)
Chloride: 106 mmol/L (ref 98–111)
Creatinine, Ser: 0.54 mg/dL (ref 0.44–1.00)
GFR, Estimated: >60 mL/min (ref >60)
Glucose, Bld: 95 mg/dL (ref 70–99)
Potassium: 4 mmol/L (ref 3.5–5.1)
Sodium: 135 mmol/L (ref 135–145)
Total Bilirubin: 0.2 mg/dL (ref 0.0–1.2)
Total Protein: 6.9 g/dL (ref 6.5–8.1)

## 2023-11-22 LAB — WET PREP, GENITAL
Clue Cells Wet Prep HPF POC: NONE SEEN
Sperm: NONE SEEN
WBC, Wet Prep HPF POC: >=10 — AB (ref <10)
Yeast Wet Prep HPF POC: NONE SEEN

## 2023-11-22 LAB — URINALYSIS, ROUTINE W REFLEX MICROSCOPIC
Bilirubin Urine: NEGATIVE
Glucose, UA: NEGATIVE mg/dL
Ketones, ur: NEGATIVE mg/dL
Nitrite: NEGATIVE
Protein, ur: NEGATIVE mg/dL
Specific Gravity, Urine: 1.023 (ref 1.005–1.030)
pH: 7 (ref 5.0–8.0)

## 2023-11-22 LAB — CBC
HCT: 32.4 % — ABNORMAL LOW (ref 36.0–46.0)
Hemoglobin: 10.4 g/dL — ABNORMAL LOW (ref 12.0–15.0)
MCH: 22.8 pg — ABNORMAL LOW (ref 26.0–34.0)
MCHC: 32.1 g/dL (ref 30.0–36.0)
MCV: 70.9 fL — ABNORMAL LOW (ref 80.0–100.0)
Platelets: 340 10*3/uL (ref 150–400)
RBC: 4.57 MIL/uL (ref 3.87–5.11)
RDW: 20.4 % — ABNORMAL HIGH (ref 11.5–15.5)
WBC: 12.4 10*3/uL — ABNORMAL HIGH (ref 4.0–10.5)
nRBC: 0 % (ref 0.0–0.2)

## 2023-11-22 LAB — GC/CHLAMYDIA PROBE AMP (~~LOC~~) NOT AT ARMC
Chlamydia: POSITIVE — AB
Comment: NEGATIVE
Comment: NORMAL
Neisseria Gonorrhea: NEGATIVE

## 2023-11-22 MED ORDER — METRONIDAZOLE 500 MG PO TABS: 500.0000 mg | ORAL_TABLET | Freq: Two times a day (BID) | ORAL | 0 refills | Status: DC

## 2023-11-22 MED ORDER — CYCLOBENZAPRINE HCL 10 MG PO TABS: 10.0000 mg | ORAL_TABLET | Freq: Two times a day (BID) | ORAL | 0 refills | Status: DC | PRN

## 2023-11-22 MED ORDER — ACETAMINOPHEN 500 MG PO TABS
1000.0000 mg | ORAL_TABLET | Freq: Once | ORAL | Status: AC
  Administered 2023-11-22: 1000 mg via ORAL
  Filled 2023-11-22: qty 2

## 2023-11-22 MED ORDER — CYCLOBENZAPRINE HCL 5 MG PO TABS
10.0000 mg | ORAL_TABLET | Freq: Once | ORAL | Status: AC
  Administered 2023-11-22: 10 mg via ORAL
  Filled 2023-11-22: qty 2

## 2023-11-22 MED ORDER — FERROUS SULFATE 325 (65 FE) MG PO TABS: 325.0000 mg | ORAL_TABLET | Freq: Every day | ORAL | 0 refills | Status: DC

## 2023-11-22 NOTE — MAU Note (Signed)
 error

## 2023-11-22 NOTE — MAU Note (Signed)
 [name] is a 33 y.o. at [redacted]w[redacted]d here in MAU reporting: by EMS for vaginal bleeding that started around 0345. Pt states she woke up due to her back hurting and went to the bathroom and passed a large clot. Pt states the bleeding continued after passing the clot. Pt states she is having lower abdominal cramping and sharp back pain that both come and go. Pt states she has not had recent intercourse.   Onset of complaint: 0345 Pain score: 6/10 lower abdominal cramping and back  Vitals:   11/22/23 0440  BP: 102/86  Pulse: 85  Resp: 20  Temp: 98.6 F (37 C)  SpO2: 100%     FHT: N/A NP states we will just do US   Lab orders placed from triage:

## 2023-11-22 NOTE — MAU Provider Note (Signed)
 Chief Complaint:  Abdominal Pain, Back Pain, and Vaginal Bleeding   HPI   [name] is a 33 y.o. [id number] at [redacted]w[redacted]d who presents to maternity admissions via EMS for vaginal bleeding. Patient reports that she woke up this morning ~ 0345 with lower  back pain and when she used the bathroom she passed a large clot and she is still reporting bleeding with lower abdominal cramping and back pain. Denies recent IC. She denies  vaginal discharge, urinary discomfort , vaginal burning/itching/irration. No c/o fever/chills, N/V   P[facility]: [facility]   Past Medical History:  Diagnosis Date   Abdominal trauma 10/21/2011   Anemia    Blood transfusion    18-Dec-2008 s/p SVD   Chronic back pain    Dental caries 10/22/2011   Dental referral  IMO SNOMED Dx Update Oct 2024     Depression    not on meds   Encounter for Depo-Provera  contraception 06/15/2012   GERD (gastroesophageal reflux disease)    High-risk pregnancy supervision 10/22/2011   History of preterm delivery, currently pregnant 10/22/2011   Premature dilation and effacement, Rx Prometrium       Mental disorder    NSVD (normal spontaneous vaginal delivery) 12/11/2011   Pelvic pain in female 06/15/2012   Rt aadnexal tenderness ? Thickening> pelvic US      Pregnancy induced hypertension 07/13/2008   required Mag Sulfate   Preterm labor    first pregnancy/current pregnancy   UTI (urinary tract infection) in pregnancy, antepartum    09/2011 tx with abx   OB History  Gravida Para Term Preterm AB Living  4 3 2 1  0 3  SAB IAB Ectopic Multiple Live Births  0 0 0 0 3    # Outcome Date GA Lbr Len/2nd Weight Sex Type Anes PTL Lv  4 Current           3 Term 12/11/11 [redacted]w[redacted]d 273:55 / 00:09 3465 g M Vag-Spont None  LIV  2 Preterm 04/08/10 [redacted]w[redacted]d   M Vag-Spont   LIV     Birth Comments: PPROM at 70 wk,+GBS, IOL at 68 wk  1 Term 10/16/08 [redacted]w[redacted]d   F Vag-Spont   LIV   Past Surgical History:  Procedure Laterality Date   FINGER SURGERY      right thumb in childhood   Family History  Problem Relation Age of Onset   Asthma Mother    Diabetes Father    Hypertension Father    Heart disease Father        afib, died 19-Dec-2019   Mental illness Brother        bipolar, schizophrenia   Anesthesia problems Neg Hx    Social History   Tobacco Use   Smoking status: Some Days    Types: Cigarettes   Smokeless tobacco: Never  Vaping Use   Vaping status: Never Used  Substance Use Topics   Alcohol use: Not Currently    Comment: occ   Drug use: Not Currently   No Known Allergies Medications Prior to Admission  Medication Sig Dispense Refill Last Dose/Taking   docusate sodium  (COLACE) 100 MG capsule Take 100 mg by mouth 2 (two) times daily.   11/21/2023   ondansetron  (ZOFRAN -ODT) 4 MG disintegrating tablet Take 1 tablet (4 mg total) by mouth every 6 (six) hours as needed for nausea. 20 tablet 0 11/21/2023   Prenatal Vit-Fe Fumarate-FA (PRENATAL MULTIVITAMIN) TABS tablet Take 1 tablet by mouth daily at 12 noon.   11/21/2023  acetaminophen  (TYLENOL ) 325 MG tablet Take 2 tablets (650 mg total) by mouth every 6 (six) hours as needed. (Patient not taking: Reported on 10/26/2023)       I have reviewed patient's Past Medical Hx, Surgical Hx, Family Hx, Social Hx, medications and allergies.   ROS  Pertinent items noted in HPI and remainder of comprehensive ROS otherwise negative.   PHYSICAL EXAM   Patient Vitals for the past 24 hrs:  BP Temp Temp src Pulse Resp SpO2 Height  11/22/23 0448 113/72 -- -- 87 -- 100 % --  11/22/23 0440 102/86 98.6 F (37 C) Oral 85 20 100 % 5\' 7"  (1.702 m)    Constitutional: Well-developed, well-nourished female in no acute distress but appears very anxious  Cardiovascular: normal rate & rhythm, warm and well-perfused Respiratory: normal effort, no problems with respiration noted GI: Abd soft, non-tender MS: Extremities nontender, no edema, normal ROM Neurologic: Alert and oriented x 4.  GU: no CVA  tenderness Pelvic: scant amount of old vaginal blood  visualized in the vault, no pooling, no active VB from the cervical os that visually appears closed ( Pelvic exam chaperoned by RN Hulda Mage RN)     Fetal Tracing: Ultrasound: 167 bpm on sono    Labs: Results for orders placed or performed during the hospital encounter of 11/22/23 (from the past 24 hours)  CBC     Status: Abnormal   Collection Time: 11/22/23  5:24 AM  Result Value Ref Range   WBC 12.4 (H) 4.0 - 10.5 K/uL   RBC 4.57 3.87 - 5.11 MIL/uL   Hemoglobin 10.4 (L) 12.0 - 15.0 g/dL   HCT 16.1 (L) 09.6 - 04.5 %   MCV 70.9 (L) 80.0 - 100.0 fL   MCH 22.8 (L) 26.0 - 34.0 pg   MCHC 32.1 30.0 - 36.0 g/dL   RDW 40.9 (H) 81.1 - 91.4 %   Platelets 340 150 - 400 K/uL   nRBC 0.0 0.0 - 0.2 %  Urinalysis, Routine w reflex microscopic -Urine, Random     Status: Abnormal   Collection Time: 11/22/23  5:39 AM  Result Value Ref Range   Color, Urine YELLOW YELLOW   APPearance HAZY (A) CLEAR   Specific Gravity, Urine 1.023 1.005 - 1.030   pH 7.0 5.0 - 8.0   Glucose, UA NEGATIVE NEGATIVE mg/dL   Hgb urine dipstick MODERATE (A) NEGATIVE   Bilirubin Urine NEGATIVE NEGATIVE   Ketones, ur NEGATIVE NEGATIVE mg/dL   Protein, ur NEGATIVE NEGATIVE mg/dL   Nitrite NEGATIVE NEGATIVE   Leukocytes,Ua TRACE (A) NEGATIVE   RBC / HPF 21-50 0 - 5 RBC/hpf   WBC, UA 0-5 0 - 5 WBC/hpf   Bacteria, UA RARE (A) NONE SEEN   Squamous Epithelial / HPF 0-5 0 - 5 /HPF   Mucus PRESENT   Wet prep, genital     Status: Abnormal   Collection Time: 11/22/23  5:39 AM   Specimen: Urine, Clean Catch  Result Value Ref Range   Yeast Wet Prep HPF POC NONE SEEN NONE SEEN   Trich, Wet Prep PRESENT (A) NONE SEEN   Clue Cells Wet Prep HPF POC NONE SEEN NONE SEEN   WBC, Wet Prep HPF POC >=10 (A) <10   Sperm NONE SEEN     Imaging:  No results found.  MDM & MAU COURSE  MDM:  MODERATE   - VB in pregnancy - R/O infectious process - Confirm viability  - CBC,  CMP ( Anemia) -  UA  - Swabs sent ( C/W Trichomonas)  Rx sent and patient aware partner needs treatment  - OB Ultrasound ( Confirms SIUP with Fetal cardiac activity 167 bpm) - Tylenol  and Flexeril  for lower back pain   MAU Course: Orders Placed This Encounter  Procedures   Wet prep, genital   US  MFM OB Limited   CBC   Comprehensive metabolic panel   Urinalysis, Routine w reflex microscopic -Urine, Random   Discharge patient Discharge disposition: 01-Home or Self Care; Discharge patient date: 11/22/2023   Discharge patient Discharge disposition: 01-Home or Self Care; Discharge patient date: 11/22/2023   Meds ordered this encounter  Medications   acetaminophen  (TYLENOL ) tablet 1,000 mg   cyclobenzaprine  (FLEXERIL ) tablet 10 mg   cyclobenzaprine  (FLEXERIL ) 10 MG tablet    Sig: Take 1 tablet (10 mg total) by mouth 2 (two) times daily as needed for muscle spasms.    Dispense:  20 tablet    Refill:  0    Supervising Provider:   PRATT, TANYA S [[id number]]   metroNIDAZOLE  (FLAGYL ) 500 MG tablet    Sig: Take 1 tablet (500 mg total) by mouth 2 (two) times daily.    Dispense:  14 tablet    Refill:  0    Supervising Provider:   PRATT, TANYA S [[id number]]   ferrous sulfate 325 (65 FE) MG tablet    Sig: Take 1 tablet (325 mg total) by mouth daily.    Dispense:  30 tablet    Refill:  0    Supervising Provider:   PRATT, TANYA S [[id number]]     I have reviewed the patient chart and performed the physical exam . I have ordered & interpreted the lab results and reviewed and interpreted the ultrasound images  Medications ordered as stated below.  A/P as described below.  Counseling and education provided and patient agreeable  with plan as described below. Verbalized understanding.    ASSESSMENT   1. Vaginal bleeding in pregnancy, second trimester   2. Threatened miscarriage   3. [redacted] weeks gestation of pregnancy   4. Back pain affecting pregnancy in second trimester   5. Trichomonas vaginitis   6. Anemia  affecting pregnancy in second trimester     PLAN  Discharge home in stable condition with return precautions.   See AVS for full description of information given to the patient including both verbal and written. Patient verbalized understanding and agrees with the plan as described above.  Future Appointments  Date Time Provider Department Center  12/27/2023  8:00 AM [facility] PROVIDER 1 [facility]  12/27/2023  8:30 AM [facility] US3 [facility]      Follow-up Information     [facility]. Call.   Specialty: Obstetrics and Gynecology Why: If symptoms worsen or fail to resolve, As scheduled for ongoing prenatal care Contact information: [address] [place]  [phone]                Allergies as of 11/22/2023   No Known Allergies      Medication List     TAKE these medications    acetaminophen  325 MG tablet Commonly known as: Tylenol  Take 2 tablets (650 mg total) by mouth every 6 (six) hours as needed.   cyclobenzaprine  10 MG tablet Commonly known as: FLEXERIL  Take 1 tablet (10 mg total) by mouth 2 (two) times daily as needed for muscle spasms.   docusate sodium  100 MG capsule  Commonly known as: COLACE Take 100 mg by mouth 2 (two) times daily.   ferrous sulfate 325 (65 FE) MG tablet Take 1 tablet (325 mg total) by mouth daily.   metroNIDAZOLE  500 MG tablet Commonly known as: FLAGYL  Take 1 tablet (500 mg total) by mouth 2 (two) times daily.   ondansetron  4 MG disintegrating tablet Commonly known as: ZOFRAN -ODT Take 1 tablet (4 mg total) by mouth every 6 (six) hours as needed for nausea.   prenatal multivitamin Tabs tablet Take 1 tablet by mouth daily at 12 noon.        Debbe Fail, MSN, [facility], Center for [employer]

## 2023-11-24 ENCOUNTER — Other Ambulatory Visit: Payer: Self-pay | Admitting: Family Medicine

## 2023-11-24 ENCOUNTER — Other Ambulatory Visit (HOSPITAL_COMMUNITY)
Admission: RE | Admit: 2023-11-24 | Discharge: 2023-11-24 | Disposition: A | Discharge status: Home / Self Care | Source: Ambulatory Visit | Attending: Family Medicine | Admitting: Family Medicine

## 2023-11-24 ENCOUNTER — Encounter: Payer: Self-pay | Admitting: Family Medicine

## 2023-11-24 ENCOUNTER — Ambulatory Visit (INDEPENDENT_AMBULATORY_CARE_PROVIDER_SITE_OTHER): Admitting: Family Medicine

## 2023-11-24 ENCOUNTER — Ambulatory Visit (HOSPITAL_COMMUNITY): Payer: Self-pay

## 2023-11-24 ENCOUNTER — Other Ambulatory Visit: Payer: Self-pay

## 2023-11-24 VITALS — BP 123/85 | HR 97 | Wt 164.0 lb

## 2023-11-24 DIAGNOSIS — O99012 Anemia complicating pregnancy, second trimester: Secondary | ICD-10-CM | POA: Diagnosis not present

## 2023-11-24 DIAGNOSIS — Z8751 Personal history of pre-term labor: Secondary | ICD-10-CM | POA: Diagnosis not present

## 2023-11-24 DIAGNOSIS — O0990 Supervision of high risk pregnancy, unspecified, unspecified trimester: Secondary | ICD-10-CM | POA: Insufficient documentation

## 2023-11-24 DIAGNOSIS — Z3A14 14 weeks gestation of pregnancy: Secondary | ICD-10-CM

## 2023-11-24 DIAGNOSIS — Z31430 Encounter of female for testing for genetic disease carrier status for procreative management: Secondary | ICD-10-CM

## 2023-11-24 DIAGNOSIS — Z8679 Personal history of other diseases of the circulatory system: Secondary | ICD-10-CM

## 2023-11-24 DIAGNOSIS — O0992 Supervision of high risk pregnancy, unspecified, second trimester: Secondary | ICD-10-CM | POA: Diagnosis not present

## 2023-11-24 DIAGNOSIS — Z8759 Personal history of other complications of pregnancy, childbirth and the puerperium: Secondary | ICD-10-CM | POA: Diagnosis not present

## 2023-11-24 MED ORDER — ASPIRIN 81 MG PO TBEC
162.0000 mg | DELAYED_RELEASE_TABLET | Freq: Every day | ORAL | 12 refills | Status: DC
Start: 2023-11-24 — End: 2023-11-25

## 2023-11-24 MED ORDER — METRONIDAZOLE 500 MG PO TABS: 500.0000 mg | ORAL_TABLET | Freq: Two times a day (BID) | ORAL | 0 refills | Status: DC

## 2023-11-24 NOTE — Progress Notes (Addendum)
 Subjective:   [name] is a 33 y.o. [phone] at [redacted]w[redacted]d by LMP being seen today for her first obstetrical visit.  Her obstetrical history is significant for pregnancy induced hypertension. Patient does intend to breast feed. Pregnancy history fully reviewed.  Patient reports no bleeding, no contractions, no cramping, and no leaking.  HISTORY: OB History  Gravida Para Term Preterm AB Living  4 3 2 1  0 3  SAB IAB Ectopic Multiple Live Births  0 0 0 0 3    # Outcome Date GA Lbr Len/2nd Weight Sex Type Anes PTL Lv  4 Current           3 Term 12/11/11 [redacted]w[redacted]d 273:55 / 00:09 7 lb 10.2 oz (3.465 kg) M Vag-Spont None  LIV     Name: VIRGLE, GAUDET     Apgar1: 8  Apgar5: 9  2 Preterm 04/08/10 [redacted]w[redacted]d   M Vag-Spont   LIV     Birth Comments: PPROM at 74 wk,+GBS, IOL at 4 wk  1 Term 10/16/08 [redacted]w[redacted]d   F Vag-Spont   LIV   Last pap smear not documented Past Medical History:  Diagnosis Date   Abdominal trauma 10/21/2011   Anemia    Blood transfusion    Nov 30, 2008 s/p SVD   Chronic back pain    Dental caries 10/22/2011   Dental referral  IMO SNOMED Dx Update Oct 2024     Depression    not on meds   Encounter for Depo-Provera  contraception 06/15/2012   GERD (gastroesophageal reflux disease)    High-risk pregnancy supervision 10/22/2011   History of preterm delivery, currently pregnant 10/22/2011   Premature dilation and effacement, Rx Prometrium       Mental disorder    NSVD (normal spontaneous vaginal delivery) 12/11/2011   Pelvic pain in female 06/15/2012   Rt aadnexal tenderness ? Thickening> pelvic US      Pregnancy induced hypertension 07/13/2008   required Mag Sulfate   Preterm labor    first pregnancy/current pregnancy   UTI (urinary tract infection) in pregnancy, antepartum    09/2011 tx with abx   Past Surgical History:  Procedure Laterality Date   FINGER SURGERY     right thumb in childhood   Family History  Problem Relation Age of Onset   Asthma Mother     Diabetes Father    Hypertension Father    Heart disease Father        afib, died 01-Dec-2019   Mental illness Brother        bipolar, schizophrenia   Anesthesia problems Neg Hx    Social History   Tobacco Use   Smoking status: Some Days    Types: Cigarettes   Smokeless tobacco: Never  Vaping Use   Vaping status: Never Used  Substance Use Topics   Alcohol use: Not Currently    Comment: occ   Drug use: Not Currently   No Known Allergies Current Outpatient Medications on File Prior to Visit  Medication Sig Dispense Refill   cyclobenzaprine  (FLEXERIL ) 10 MG tablet Take 1 tablet (10 mg total) by mouth 2 (two) times daily as needed for muscle spasms. 20 tablet 0   docusate sodium  (COLACE) 100 MG capsule Take 100 mg by mouth 2 (two) times daily.     ferrous sulfate 325 (65 FE) MG tablet Take 1 tablet (325 mg total) by mouth daily. 30 tablet 0   metroNIDAZOLE  (FLAGYL ) 500 MG tablet Take 1 tablet (500 mg total) by mouth 2 (  two) times daily. 14 tablet 0   ondansetron  (ZOFRAN -ODT) 4 MG disintegrating tablet Take 1 tablet (4 mg total) by mouth every 6 (six) hours as needed for nausea. 20 tablet 0   Prenatal Vit-Fe Fumarate-FA (PRENATAL MULTIVITAMIN) TABS tablet Take 1 tablet by mouth daily at 12 noon.     acetaminophen  (TYLENOL ) 325 MG tablet Take 2 tablets (650 mg total) by mouth every 6 (six) hours as needed. (Patient not taking: Reported on 10/26/2023)     No current facility-administered medications on file prior to visit.     Exam   Vitals:   11/24/23 1432  BP: 123/85  Pulse: 97  Weight: 164 lb (74.4 kg)   Fetal Heart Rate (bpm): 167  Uterus:     Pelvic Exam: Perineum: no hemorrhoids, normal perineum   Vulva: normal external genitalia, no lesions   Vagina:  normal mucosa, normal discharge   Cervix: no lesions and normal, pap smear done.    Adnexa: normal adnexa and no mass, fullness, tenderness   Bony Pelvis: average  System: General: well-developed, well-nourished female in no  acute distress   Breast:  normal appearance, no masses or tenderness   Skin: normal coloration and turgor, no rashes   Neurologic: oriented, normal, negative, normal mood   Extremities: normal strength, tone, and muscle mass, ROM of all joints is normal   HEENT PERRLA, extraocular movement intact and sclera clear, anicteric   Mouth/Teeth mucous membranes moist, pharynx normal without lesions and dental hygiene good   Neck supple and no masses   Cardiovascular: regular rate and rhythm   Respiratory:  no respiratory distress, normal breath sounds   Abdomen: soft, non-tender; bowel sounds normal; no masses,  no organomegaly     Assessment:   Pregnancy: [id number] Patient Active Problem List   Diagnosis Date Noted   History of preterm delivery 11/24/2023   History of postpartum gestational hypertension 11/24/2023   Supervision of high risk pregnancy, antepartum 10/26/2023     Plan:  1. Supervision of high risk pregnancy, antepartum FHR appropriate today Patient with recent trip diagnosis, has not yet taken treatment Pap smear collected today, cervix extremely friable Started on ASA 162 mg daily given history of postpartum hypertension - Cytology - PAP( [facility]) - Hemoglobin A1c - Culture, OB Urine - CBC/D/Plt+RPR+Rh+ABO+RubIgG... - Protein / creatinine ratio, urine - Comprehensive metabolic panel with GFR - TSH Rfx on Abnormal to Free T4  2. History of preterm delivery (Primary) 34-week delivery.  Has had term delivery since  3. History of postpartum gestational hypertension BP appropriate today Will get baseline labs today  4. Anemia affecting pregnancy in second trimester Most recent hemoglobin 10.4.  Is going to pick up iron supplementation  5. [redacted] weeks gestation of pregnancy  6. High-risk pregnancy - PANORAMA PRENATAL TEST  7. Encounter of female for testing for genetic disease carrier status for procreative management - [facility] Basic Panel   Initial labs  drawn. Continue prenatal vitamins. Genetic Screening discussed, NIPS: ordered. Ultrasound discussed; fetal anatomic survey: ordered. Problem list reviewed and updated. The nature of [facility] with multiple MDs and other Advanced Practice Providers was explained to patient; also emphasized that residents, students are part of our team. Routine obstetric precautions reviewed. No follow-ups on file.

## 2023-11-25 ENCOUNTER — Ambulatory Visit: Payer: Self-pay | Admitting: Family Medicine

## 2023-11-25 LAB — COMPREHENSIVE METABOLIC PANEL WITH GFR
ALT: 24 IU/L (ref 0–32)
AST: 17 IU/L (ref 0–40)
Albumin: 4.1 g/dL (ref 3.9–4.9)
Alkaline Phosphatase: 87 IU/L (ref 44–121)
BUN/Creatinine Ratio: 10 (ref 9–23)
BUN: 5 mg/dL — ABNORMAL LOW (ref 6–20)
Bilirubin Total: 0.2 mg/dL (ref 0.0–1.2)
CO2: 18 mmol/L — ABNORMAL LOW (ref 20–29)
Calcium: 9.9 mg/dL (ref 8.7–10.2)
Chloride: 100 mmol/L (ref 96–106)
Creatinine, Ser: 0.52 mg/dL — ABNORMAL LOW (ref 0.57–1.00)
Globulin, Total: 3.2 g/dL (ref 1.5–4.5)
Glucose: 92 mg/dL (ref 70–99)
Potassium: 3.9 mmol/L (ref 3.5–5.2)
Sodium: 136 mmol/L (ref 134–144)
Total Protein: 7.3 g/dL (ref 6.0–8.5)
eGFR: 127 mL/min/{1.73_m2} (ref >59)

## 2023-11-25 LAB — CYTOLOGY - PAP
Chlamydia: POSITIVE — AB
Comment: NEGATIVE
Comment: NEGATIVE
Comment: NORMAL
Diagnosis: NEGATIVE
High risk HPV: POSITIVE — AB
Neisseria Gonorrhea: NEGATIVE

## 2023-11-25 LAB — HEMOGLOBIN A1C
Est. average glucose Bld gHb Est-mCnc: 100 mg/dL
Hgb A1c MFr Bld: 5.1 % (ref 4.8–5.6)

## 2023-11-25 LAB — TSH RFX ON ABNORMAL TO FREE T4: TSH: 0.323 u[IU]/mL — ABNORMAL LOW (ref 0.450–4.500)

## 2023-11-25 LAB — PROTEIN / CREATININE RATIO, URINE
Creatinine, Urine: 88.4 mg/dL
Protein, Ur: 8.6 mg/dL
Protein/Creat Ratio: 97 mg/g{creat} (ref 0–200)

## 2023-11-25 LAB — T4F: T4,Free (Direct): 0.9 ng/dL (ref 0.82–1.77)

## 2023-11-25 MED ORDER — ASPIRIN 81 MG PO TBEC: 162.0000 mg | DELAYED_RELEASE_TABLET | Freq: Every day | ORAL | 12 refills | Status: DC

## 2023-11-25 MED ORDER — AZITHROMYCIN 250 MG PO TABS: 1000.0000 mg | ORAL_TABLET | Freq: Once | ORAL | 0 refills | Status: DC

## 2023-11-25 MED ORDER — AZITHROMYCIN 250 MG PO TABS: 1000.0000 mg | ORAL_TABLET | Freq: Once | ORAL | 0 refills | Status: AC

## 2023-11-25 NOTE — Telephone Encounter (Signed)
 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  Also discussed chlamydia diagnosis.  That she has to send the aspirin and the a Zithromax  to [facility].  Prescription sent.  No further questions or concerns.

## 2023-11-26 LAB — CBC/D/PLT+RPR+RH+ABO+RUBIGG...
Basophils Absolute: 0 10*3/uL (ref 0.0–0.2)
Basos: 0 %
EOS (ABSOLUTE): 0.2 10*3/uL (ref 0.0–0.4)
Eos: 2 %
HCV Ab: NONREACTIVE
HIV Screen 4th Generation wRfx: NONREACTIVE
Hematocrit: 35.2 % (ref 34.0–46.6)
Hemoglobin: 11 g/dL — ABNORMAL LOW (ref 11.1–15.9)
Hepatitis B Surface Ag: NEGATIVE
Immature Grans (Abs): 0 10*3/uL (ref 0.0–0.1)
Immature Granulocytes: 0 %
Lymphocytes Absolute: 2.9 10*3/uL (ref 0.7–3.1)
Lymphs: 27 %
MCH: 22.9 pg — ABNORMAL LOW (ref 26.6–33.0)
MCHC: 31.3 g/dL — ABNORMAL LOW (ref 31.5–35.7)
MCV: 73 fL — ABNORMAL LOW (ref 79–97)
Monocytes Absolute: 0.6 10*3/uL (ref 0.1–0.9)
Monocytes: 6 %
Neutrophils Absolute: 7 10*3/uL (ref 1.4–7.0)
Neutrophils: 65 %
Platelets: 379 10*3/uL (ref 150–450)
RBC: 4.81 x10E6/uL (ref 3.77–5.28)
RDW: 19.3 % — ABNORMAL HIGH (ref 11.7–15.4)
RPR Ser Ql: NONREACTIVE
Rh Factor: POSITIVE
Rubella Antibodies, IGG: 1.1 {index} (ref >0.99)
WBC: 10.8 10*3/uL (ref 3.4–10.8)

## 2023-11-26 LAB — AB SCR+ANTIBODY ID: Antibody Screen: POSITIVE — AB

## 2023-11-26 LAB — URINE CULTURE, OB REFLEX

## 2023-11-26 LAB — CULTURE, OB URINE

## 2023-11-26 LAB — HCV INTERPRETATION

## 2023-11-26 NOTE — Telephone Encounter (Addendum)
 PC/ Attempted to reach patient via telephone and patient's mother answered. HIPAA compliant message left to have patient return call. Debbe Fail, MSN, [facility], Center for [employer] with patient shortly after leaving message above and she is aware of her results for positive Chlamydia and has RX for treatment and is aware of partner notification and treatment needed.  Debbe Fail, MSN, [facility], Center for [employer]

## 2023-12-02 LAB — PANORAMA PRENATAL TEST FULL PANEL:PANORAMA TEST PLUS 5 ADDITIONAL MICRODELETIONS: FETAL FRACTION: 9.1

## 2023-12-04 LAB — HORIZON CUSTOM: REPORT SUMMARY: POSITIVE — AB

## 2023-12-17 ENCOUNTER — Inpatient Hospital Stay (HOSPITAL_COMMUNITY)
Admission: AD | Admit: 2023-12-17 | Discharge: 2023-12-18 | DRG: 832 | Disposition: A | Discharge status: Home / Self Care | Attending: Obstetrics and Gynecology | Admitting: Obstetrics and Gynecology

## 2023-12-17 ENCOUNTER — Encounter (HOSPITAL_COMMUNITY): Payer: Self-pay | Admitting: Obstetrics and Gynecology

## 2023-12-17 ENCOUNTER — Other Ambulatory Visit: Payer: Self-pay

## 2023-12-17 ENCOUNTER — Inpatient Hospital Stay (HOSPITAL_COMMUNITY)

## 2023-12-17 DIAGNOSIS — R1084 Generalized abdominal pain: Secondary | ICD-10-CM | POA: Diagnosis not present

## 2023-12-17 DIAGNOSIS — Z3A17 17 weeks gestation of pregnancy: Secondary | ICD-10-CM | POA: Diagnosis not present

## 2023-12-17 DIAGNOSIS — Z5986 Financial insecurity: Secondary | ICD-10-CM | POA: Diagnosis not present

## 2023-12-17 DIAGNOSIS — A568 Sexually transmitted chlamydial infection of other sites: Secondary | ICD-10-CM | POA: Diagnosis not present

## 2023-12-17 DIAGNOSIS — E669 Obesity, unspecified: Secondary | ICD-10-CM

## 2023-12-17 DIAGNOSIS — O4102X0 Oligohydramnios, second trimester, not applicable or unspecified: Secondary | ICD-10-CM | POA: Diagnosis present

## 2023-12-17 DIAGNOSIS — F17210 Nicotine dependence, cigarettes, uncomplicated: Secondary | ICD-10-CM | POA: Diagnosis present

## 2023-12-17 DIAGNOSIS — O09212 Supervision of pregnancy with history of pre-term labor, second trimester: Secondary | ICD-10-CM | POA: Diagnosis not present

## 2023-12-17 DIAGNOSIS — O99332 Smoking (tobacco) complicating pregnancy, second trimester: Secondary | ICD-10-CM | POA: Diagnosis not present

## 2023-12-17 DIAGNOSIS — A562 Chlamydial infection of genitourinary tract, unspecified: Secondary | ICD-10-CM | POA: Diagnosis present

## 2023-12-17 DIAGNOSIS — O26899 Other specified pregnancy related conditions, unspecified trimester: Secondary | ICD-10-CM

## 2023-12-17 DIAGNOSIS — O42912 Preterm premature rupture of membranes, unspecified as to length of time between rupture and onset of labor, second trimester: Principal | ICD-10-CM | POA: Diagnosis present

## 2023-12-17 DIAGNOSIS — O26892 Other specified pregnancy related conditions, second trimester: Secondary | ICD-10-CM | POA: Diagnosis not present

## 2023-12-17 DIAGNOSIS — Z833 Family history of diabetes mellitus: Secondary | ICD-10-CM

## 2023-12-17 DIAGNOSIS — Z3A19 19 weeks gestation of pregnancy: Secondary | ICD-10-CM

## 2023-12-17 DIAGNOSIS — A5901 Trichomonal vulvovaginitis: Secondary | ICD-10-CM

## 2023-12-17 DIAGNOSIS — R42 Dizziness and giddiness: Secondary | ICD-10-CM | POA: Diagnosis not present

## 2023-12-17 DIAGNOSIS — D563 Thalassemia minor: Secondary | ICD-10-CM | POA: Insufficient documentation

## 2023-12-17 DIAGNOSIS — Z555 Less than a high school diploma: Secondary | ICD-10-CM | POA: Diagnosis not present

## 2023-12-17 DIAGNOSIS — N898 Other specified noninflammatory disorders of vagina: Secondary | ICD-10-CM

## 2023-12-17 DIAGNOSIS — O42919 Preterm premature rupture of membranes, unspecified as to length of time between rupture and onset of labor, unspecified trimester: Secondary | ICD-10-CM | POA: Diagnosis present

## 2023-12-17 DIAGNOSIS — O98319 Other infections with a predominantly sexual mode of transmission complicating pregnancy, unspecified trimester: Secondary | ICD-10-CM

## 2023-12-17 DIAGNOSIS — Z7982 Long term (current) use of aspirin: Secondary | ICD-10-CM | POA: Diagnosis not present

## 2023-12-17 DIAGNOSIS — O98312 Other infections with a predominantly sexual mode of transmission complicating pregnancy, second trimester: Secondary | ICD-10-CM | POA: Diagnosis not present

## 2023-12-17 DIAGNOSIS — O4100X0 Oligohydramnios, unspecified trimester, not applicable or unspecified: Secondary | ICD-10-CM | POA: Diagnosis present

## 2023-12-17 DIAGNOSIS — Z5982 Transportation insecurity: Secondary | ICD-10-CM

## 2023-12-17 DIAGNOSIS — R102 Pelvic and perineal pain: Secondary | ICD-10-CM | POA: Diagnosis not present

## 2023-12-17 DIAGNOSIS — Z8249 Family history of ischemic heart disease and other diseases of the circulatory system: Secondary | ICD-10-CM

## 2023-12-17 HISTORY — DX: Trichomonal vulvovaginitis: A59.01

## 2023-12-17 HISTORY — DX: Sexually transmitted chlamydial infection of other sites: O98.319

## 2023-12-17 HISTORY — DX: Sexually transmitted chlamydial infection of other sites: A56.8

## 2023-12-17 LAB — CBC WITH DIFFERENTIAL/PLATELET
Abs Immature Granulocytes: 0 10*3/uL (ref 0.00–0.07)
Basophils Absolute: 0.1 10*3/uL (ref 0.0–0.1)
Basophils Relative: 1 %
Eosinophils Absolute: 0 10*3/uL (ref 0.0–0.5)
Eosinophils Relative: 0 %
HCT: 34.9 % — ABNORMAL LOW (ref 36.0–46.0)
Hemoglobin: 11 g/dL — ABNORMAL LOW (ref 12.0–15.0)
Lymphocytes Relative: 7 %
Lymphs Abs: 1 10*3/uL (ref 0.7–4.0)
MCH: 24.1 pg — ABNORMAL LOW (ref 26.0–34.0)
MCHC: 31.5 g/dL (ref 30.0–36.0)
MCV: 76.4 fL — ABNORMAL LOW (ref 80.0–100.0)
Monocytes Absolute: 0.6 10*3/uL (ref 0.1–1.0)
Monocytes Relative: 4 %
Neutro Abs: 13 10*3/uL — ABNORMAL HIGH (ref 1.7–7.7)
Neutrophils Relative %: 88 %
Platelets: 342 10*3/uL (ref 150–400)
RBC: 4.57 MIL/uL (ref 3.87–5.11)
RDW: 23.2 % — ABNORMAL HIGH (ref 11.5–15.5)
WBC: 14.8 10*3/uL — ABNORMAL HIGH (ref 4.0–10.5)
nRBC: 0 % (ref 0.0–0.2)
nRBC: 0 /100{WBCs}

## 2023-12-17 LAB — RUPTURE OF MEMBRANE (ROM)PLUS: Rom Plus: POSITIVE

## 2023-12-17 LAB — WET PREP, GENITAL
Clue Cells Wet Prep HPF POC: NONE SEEN
Sperm: NONE SEEN
Trich, Wet Prep: NONE SEEN
WBC, Wet Prep HPF POC: >=10 — AB (ref <10)
Yeast Wet Prep HPF POC: NONE SEEN

## 2023-12-17 LAB — POCT FERN TEST: POCT Fern Test: NEGATIVE

## 2023-12-17 MED ORDER — OXYCODONE HCL 5 MG PO TABS
5.0000 mg | ORAL_TABLET | ORAL | Status: DC | PRN
  Administered 2023-12-17 – 2023-12-18 (×2): 5 mg via ORAL
  Filled 2023-12-17 (×2): qty 1

## 2023-12-17 MED ORDER — PRENATAL MULTIVITAMIN CH: 1.0000 | ORAL_TABLET | Freq: Every day | ORAL | Status: DC

## 2023-12-17 MED ORDER — CALCIUM CARBONATE ANTACID 500 MG PO CHEW
2.0000 | CHEWABLE_TABLET | ORAL | Status: DC | PRN
Start: 2023-12-17 — End: 2023-12-18

## 2023-12-17 MED ORDER — MORPHINE SULFATE (PF) 4 MG/ML IV SOLN
2.0000 mg | INTRAVENOUS | Status: AC | PRN
  Administered 2023-12-17 (×2): 2 mg via INTRAMUSCULAR
  Filled 2023-12-17 (×2): qty 1

## 2023-12-17 MED ORDER — MORPHINE SULFATE (PF) 4 MG/ML IV SOLN
2.0000 mg | INTRAVENOUS | Status: DC | PRN
  Administered 2023-12-17: 2 mg via INTRAMUSCULAR
  Filled 2023-12-17: qty 1

## 2023-12-17 MED ORDER — ACETAMINOPHEN 325 MG PO TABS: 650.0000 mg | ORAL_TABLET | ORAL | Status: DC | PRN

## 2023-12-17 MED ORDER — SODIUM CHLORIDE 0.9% FLUSH
3.0000 mL | Freq: Two times a day (BID) | INTRAVENOUS | Status: DC
  Administered 2023-12-17 – 2023-12-18 (×2): 10 mL via INTRAVENOUS

## 2023-12-17 MED ORDER — ZOLPIDEM TARTRATE 5 MG PO TABS
5.0000 mg | ORAL_TABLET | Freq: Every evening | ORAL | Status: DC | PRN
  Administered 2023-12-17: 5 mg via ORAL
  Filled 2023-12-17: qty 1

## 2023-12-17 MED ORDER — SODIUM CHLORIDE 0.9% FLUSH: 3.0000 mL | INTRAVENOUS | Status: DC | PRN

## 2023-12-17 MED ORDER — DOCUSATE SODIUM 100 MG PO CAPS: 100.0000 mg | ORAL_CAPSULE | Freq: Every day | ORAL | Status: DC

## 2023-12-17 NOTE — MAU Provider Note (Signed)
 Chief Complaint: Abdominal Pain, Vaginal Bleeding, and Vaginal Discharge   Event Date/Time   First Provider Initiated Contact with Patient 12/17/23 0901      SUBJECTIVE HPI: [name] is a 33 y.o. [id number] at [redacted]w[redacted]d by LMP who presents to maternity admissions reporting leakage of watery blood tinged fluid since 0800 after using the bathroom with intermittent severe abdominal cramping with a an urge to push.  She denies vaginal bleeding, vaginal itching/burning, urinary symptoms, h/a, dizziness, n/v, or fever/chills.    HPI  Past Medical History:  Diagnosis Date   Abdominal trauma 10/21/2011   Anemia    Blood transfusion    2010 s/p SVD   Chronic back pain    Dental caries 10/22/2011   Dental referral  IMO SNOMED Dx Update Oct 2024     Depression    not on meds   Encounter for Depo-Provera  contraception 06/15/2012   GERD (gastroesophageal reflux disease)    High-risk pregnancy supervision 10/22/2011   History of preterm delivery, currently pregnant 10/22/2011   Premature dilation and effacement, Rx Prometrium       Mental disorder    NSVD (normal spontaneous vaginal delivery) 12/11/2011   Pelvic pain in female 06/15/2012   Rt aadnexal tenderness ? Thickening> pelvic US      Pregnancy induced hypertension 07/13/2008   required Mag Sulfate   Preterm labor    first pregnancy/current pregnancy   UTI (urinary tract infection) in pregnancy, antepartum    09/2011 tx with abx   Past Surgical History:  Procedure Laterality Date   FINGER SURGERY     right thumb in childhood   Social History   Socioeconomic History   Marital status: Single    Spouse name: Not on file   Number of children: Not on file   Years of education: Not on file   Highest education level: 10th grade  Occupational History   Not on file  Tobacco Use   Smoking status: Some Days    Types: Cigarettes   Smokeless tobacco: Never  Vaping Use   Vaping status: Never Used  Substance and Sexual Activity    Alcohol use: Not Currently    Comment: occ   Drug use: Not Currently   Sexual activity: Not Currently    Birth control/protection: None  Other Topics Concern   Not on file  Social History Narrative   Not on file   Social Drivers of Health   Financial Resource Strain: High Risk (11/24/2023)   Overall Financial Resource Strain (CARDIA)    Difficulty of Paying Living Expenses: Very hard  Food Insecurity: Food Insecurity Present (11/24/2023)   Hunger Vital Sign    Worried About Running Out of Food in the Last Year: Sometimes true    Ran Out of Food in the Last Year: Never true  Transportation Needs: Unmet Transportation Needs (11/24/2023)   PRAPARE - Transportation    Lack of Transportation (Medical): Yes    Lack of Transportation (Non-Medical): Yes  Physical Activity: Insufficiently Active (11/24/2023)   Exercise Vital Sign    Days of Exercise per Week: 1 day    Minutes of Exercise per Session: 40 min  Stress: No Stress Concern Present (11/24/2023)   [employer] of Occupational Health - Occupational Stress Questionnaire    Feeling of Stress : Only a little  Social Connections: Moderately Isolated (11/24/2023)   Social Connection and Isolation Panel [NHANES]    Frequency of Communication with Friends and Family: More than three times a week  Frequency of Social Gatherings with Friends and Family: Never    Attends Religious Services: 1 to 4 times per year    Active Member of [employer] or Organizations: No    Attends Engineer, structural: Not on file    Marital Status: Never married  Intimate Partner Violence: Not on file   No current facility-administered medications on file prior to encounter.   Current Outpatient Medications on File Prior to Encounter  Medication Sig Dispense Refill   aspirin  EC 81 MG tablet Take 2 tablets (162 mg total) by mouth daily. Swallow whole. 60 tablet 12   docusate sodium  (COLACE) 100 MG capsule Take 100 mg by mouth 2 (two) times daily.      ferrous sulfate  325 (65 FE) MG tablet Take 1 tablet (325 mg total) by mouth daily. 30 tablet 0   Prenatal Vit-Fe Fumarate-FA (PRENATAL MULTIVITAMIN) TABS tablet Take 1 tablet by mouth daily at 12 noon.     acetaminophen  (TYLENOL ) 325 MG tablet Take 2 tablets (650 mg total) by mouth every 6 (six) hours as needed. (Patient not taking: Reported on 10/26/2023)     cyclobenzaprine  (FLEXERIL ) 10 MG tablet Take 1 tablet (10 mg total) by mouth 2 (two) times daily as needed for muscle spasms. 20 tablet 0   metroNIDAZOLE  (FLAGYL ) 500 MG tablet Take 1 tablet (500 mg total) by mouth 2 (two) times daily. 14 tablet 0   ondansetron  (ZOFRAN -ODT) 4 MG disintegrating tablet Take 1 tablet (4 mg total) by mouth every 6 (six) hours as needed for nausea. 20 tablet 0   No Known Allergies  ROS:  Pertinent positives/negatives listed above.  I have reviewed patient's Past Medical Hx, Surgical Hx, Family Hx, Social Hx, medications and allergies.   Physical Exam  Patient Vitals for the past 24 hrs:  BP Temp Temp src Pulse Resp SpO2  12/17/23 1137 139/69 98 F (36.7 C) Oral 82 18 --  12/17/23 0919 132/73 -- -- 87 -- 100 %  12/17/23 0856 (!) 114/96 97.6 F (36.4 C) -- 86 (!) 21 --   Constitutional: Well-developed, well-nourished female in no acute distress.  Cardiovascular: normal rate Respiratory: normal effort GI: Abd soft, non-tender. Pos BS x 4 MS: Extremities nontender, no edema, normal ROM Neurologic: Alert and oriented x 4.  GU: Neg CVAT.  PELVIC EXAM: Difficult exam due to pain and bearing down. Cervix pink, visually thick, without lesion, moderate watery discharge, vaginal walls and external genitalia normal  Bedside US : 138  LAB RESULTS Results for orders placed or performed during the hospital encounter of 12/17/23 (from the past 24 hours)  Wet prep, genital     Status: Abnormal   Collection Time: 12/17/23  9:06 AM  Result Value Ref Range   Yeast Wet Prep HPF POC NONE SEEN NONE SEEN   Trich,  Wet Prep NONE SEEN NONE SEEN   Clue Cells Wet Prep HPF POC NONE SEEN NONE SEEN   WBC, Wet Prep HPF POC >=10 (A) <10   Sperm NONE SEEN   Fern Test     Status: Normal   Collection Time: 12/17/23  9:25 AM  Result Value Ref Range   POCT Fern Test Negative = intact amniotic membranes   Rupture of Membrane (ROM) Plus     Status: None   Collection Time: 12/17/23 10:32 AM  Result Value Ref Range   Rom Plus POSITIVE   Type and screen [facility]     Status: None (Preliminary result)   Collection  Time: 12/17/23 11:01 AM  Result Value Ref Range   ABO/RH(D) PENDING    Antibody Screen PENDING    Sample Expiration      12/20/2023,2359 Performed at [facility], [address], [place], [place] [address]   CBC with Differential/Platelet     Status: Abnormal   Collection Time: 12/17/23 11:03 AM  Result Value Ref Range   WBC 14.8 (H) 4.0 - 10.5 K/uL   RBC 4.57 3.87 - 5.11 MIL/uL   Hemoglobin 11.0 (L) 12.0 - 15.0 g/dL   HCT 60.4 (L) 54.0 - 98.1 %   MCV 76.4 (L) 80.0 - 100.0 fL   MCH 24.1 (L) 26.0 - 34.0 pg   MCHC 31.5 30.0 - 36.0 g/dL   RDW 19.1 (H) 47.8 - 29.5 %   Platelets 342 150 - 400 K/uL   nRBC 0.0 0.0 - 0.2 %   Neutrophils Relative % 88 %   Neutro Abs 13.0 (H) 1.7 - 7.7 K/uL   Lymphocytes Relative 7 %   Lymphs Abs 1.0 0.7 - 4.0 K/uL   Monocytes Relative 4 %   Monocytes Absolute 0.6 0.1 - 1.0 K/uL   Eosinophils Relative 0 %   Eosinophils Absolute 0.0 0.0 - 0.5 K/uL   Basophils Relative 1 %   Basophils Absolute 0.1 0.0 - 0.1 K/uL   WBC Morphology See Note    RBC Morphology See Note    Smear Review See Note    nRBC 0 0 /100 WBC   Abs Immature Granulocytes 0.00 0.00 - 0.07 K/uL    --/--/PENDING (06/06 1101)  IMAGING US  MFM OB Limited Result Date: 11/22/2023 ----------------------------------------------------------------------  OBSTETRICS REPORT                       (Signed Final 11/22/2023 02:27 pm)  ---------------------------------------------------------------------- Patient Info  ID #:       [id number]                          D.O.B.:  [birth year] (33 yrs)(F)  Name:       [name] [facility]               Visit Date: 11/22/2023 05:14 am ---------------------------------------------------------------------- Performed By  Attending:        Jodeen Munch      Referred By:      [facility] MAU/Triage                    MD  Performed By:     Brannon Calamity     [facility] ---------------------------------------------------------------------- Orders  #  Description                           Code        Ordered By  1  US  MFM OB LIMITED                     84696.29    Debbe Fail ----------------------------------------------------------------------  #  Order #  Accession #                Episode #  1  [id number]                   [id number]                 [id number] ---------------------------------------------------------------------- Indications  [redacted] weeks gestation of pregnancy                Z3A.14  Vaginal bleeding in pregnancy, second          O46.92  trimester ---------------------------------------------------------------------- Fetal Evaluation  Num Of Fetuses:         1  Fetal Heart Rate(bpm):  167  Cardiac Activity:       Observed  Presentation:           Transverse, head to maternal left  Placenta:               Fundal  P. Cord Insertion:      Not well visualized  Amniotic Fluid  AFI FV:      Within normal limits                              Largest Pocket(cm)                              2.9  Comment:    No subchorionic hemorrhage seen. ---------------------------------------------------------------------- OB History  Gravidity:    4         Term:   2        Prem:   1        SAB:   0  TOP:          0       Ectopic:  0        Living: 3  ---------------------------------------------------------------------- Gestational Age  LMP:           14w 2d        Date:  08/14/23                   EDD:   05/20/24  Best:          14w 2d     Det. By:  LMP  (08/14/23)          EDD:   05/20/24 ---------------------------------------------------------------------- Cervix Uterus Adnexa  Cervix  Closed  Right Ovary  Size(cm)     3.69   x   1.94   x  1.41      Vol(ml): 5.29  Within normal limits.  Left Ovary  Size(cm)     3.97   x   2.53   x  2.06      Vol(ml): 10.83  Within normal limits.  Adnexa  No abnormality visualized ---------------------------------------------------------------------- Impression  Limited exam due to second trimester vaginal bleeding  Good amniotic fluid and fetal movement.  Cervix appears closed.  No evidence of subchorionic hemorrhage noted. ---------------------------------------------------------------------- Recommendations  Clinical correlation recommended. ----------------------------------------------------------------------              Jodeen Munch, MD Electronically Signed Final Report   11/22/2023 02:27 pm ----------------------------------------------------------------------    MAU Management/MDM: Orders Placed This Encounter  Procedures   Wet prep, genital   US  MFM OB LIMITED   Rupture of Membrane (ROM) Plus   CBC with Differential/Platelet   CBC  Diet regular Room service appropriate? Yes; Fluid consistency: Thin   Notify physician (specify)   Vital signs   Patient may shower   Defer vaginal exam for vaginal bleeding or PROM <37 weeks   Apply Antepartum Care Plan   Initiate Oral Care Protocol   Initiate Carrier Fluid Protocol   SCDs   Assess fetal heart tones   Full code   Fern Test   Type and screen [facility]   Insert peripheral IV   Saline lock IV   Admit to Inpatient (patient's expected length of stay will be greater than 2 midnights or inpatient only procedure)   Admit to  Inpatient (patient's expected length of stay will be greater than 2 midnights or inpatient only procedure)    Meds ordered this encounter  Medications   DISCONTD: morphine  (PF) 4 MG/ML injection 2 mg   acetaminophen  (TYLENOL ) tablet 650 mg   zolpidem  (AMBIEN ) tablet 5 mg   DISCONTD: docusate sodium  (COLACE) capsule 100 mg   calcium  carbonate (TUMS - dosed in mg elemental calcium ) chewable tablet 400 mg of elemental calcium    DISCONTD: prenatal multivitamin tablet 1 tablet   sodium chloride  flush (NS) 0.9 % injection 3-10 mL   sodium chloride  flush (NS) 0.9 % injection 3-10 mL   morphine  (PF) 4 MG/ML injection 2 mg    ASSESSMENT 17 week PPROM - ROM plus positive, pooling positive, negative fern  PLAN Admit to [facility] care for PPROM Observation   Darrow End, MD FMOB Fellow, Faculty practice [facility], Center for [facility]  12/17/2023  3:16 PM

## 2023-12-17 NOTE — H&P (Addendum)
 Obstetrics & Gynecology H&P   Date of Admission: 12/17/2023   Requesting Provider: Maternity Admissions Unit  Primary OBGYN: [facility]  Primary Care Provider: Pcp, No  Reason for Admission: PPROM this morning  History of Present Illness: Ms. Insalaco is a 33 y.o. [id number] at 17/6 with the above chief complaint. History significant for h/o G2 32wk PPROM, h/o trich and chlamydia mid may Patient woke up 8am today and felt a gush fluid and pain and called EMS. In MAU, she ruled in for PPROM. Cervix not seen on spec exam but TAUS with cervix >3cm  Patient currently comfortable and appropriately emotional.   ROS: A 12-point review of systems was performed and negative, except as stated in the above HPI.  OBGYN History: As per HPI. OB History  Gravida Para Term Preterm AB Living  4 3 2 1  0 3  SAB IAB Ectopic Multiple Live Births  0 0 0 0 3    # Outcome Date GA Lbr Len/2nd Weight Sex Type Anes PTL Lv  4 Current           3 Term 12/11/11 [redacted]w[redacted]d 273:55 / 00:09 3465 g M Vag-Spont None  LIV  2 Preterm 04/08/10 [redacted]w[redacted]d   M Vag-Spont   LIV     Birth Comments: PPROM at 63 wk,+GBS, IOL at 34 wk  1 Term 10/16/08 [redacted]w[redacted]d   F Vag-Spont   LIV   Past Medical History: Past Medical History:  Diagnosis Date   Abdominal trauma 10/21/2011   Anemia    Blood transfusion    2008-12-23 s/p SVD   Chronic back pain    Dental caries 10/22/2011   Dental referral  IMO SNOMED Dx Update Oct 2024     Depression    not on meds   Encounter for Depo-Provera  contraception 06/15/2012   GERD (gastroesophageal reflux disease)    High-risk pregnancy supervision 10/22/2011   History of preterm delivery, currently pregnant 10/22/2011   Premature dilation and effacement, Rx Prometrium       Mental disorder    NSVD (normal spontaneous vaginal delivery) 12/11/2011   Pelvic pain in female 06/15/2012   Rt aadnexal tenderness ? Thickening> pelvic US      Pregnancy induced hypertension 07/13/2008   required Mag Sulfate   Preterm  labor    first pregnancy/current pregnancy   UTI (urinary tract infection) in pregnancy, antepartum    09/2011 tx with abx   Past Surgical History: Past Surgical History:  Procedure Laterality Date   FINGER SURGERY     right thumb in childhood   Family History:  Family History  Problem Relation Age of Onset   Asthma Mother    Diabetes Father    Hypertension Father    Heart disease Father        afib, died 2019-12-24   Mental illness Brother        bipolar, schizophrenia   Anesthesia problems Neg Hx    Social History:  Social History   Socioeconomic History   Marital status: Single    Spouse name: Not on file   Number of children: Not on file   Years of education: Not on file   Highest education level: 10th grade  Occupational History   Not on file  Tobacco Use   Smoking status: Some Days    Types: Cigarettes   Smokeless tobacco: Never  Vaping Use   Vaping status: Never Used  Substance and Sexual Activity   Alcohol use: Not Currently    Comment: occ  Drug use: Not Currently   Sexual activity: Not Currently    Birth control/protection: None  Other Topics Concern   Not on file  Social History Narrative   Not on file   Social Drivers of Health   Financial Resource Strain: High Risk (11/24/2023)   Overall Financial Resource Strain (CARDIA)    Difficulty of Paying Living Expenses: Very hard  Food Insecurity: Food Insecurity Present (11/24/2023)   Hunger Vital Sign    Worried About Running Out of Food in the Last Year: Sometimes true    Ran Out of Food in the Last Year: Never true  Transportation Needs: Unmet Transportation Needs (11/24/2023)   PRAPARE - Transportation    Lack of Transportation (Medical): Yes    Lack of Transportation (Non-Medical): Yes  Physical Activity: Insufficiently Active (11/24/2023)   Exercise Vital Sign    Days of Exercise per Week: 1 day    Minutes of Exercise per Session: 40 min  Stress: No Stress Concern Present (11/24/2023)   [employer] of Occupational Health - Occupational Stress Questionnaire    Feeling of Stress : Only a little  Social Connections: Moderately Isolated (11/24/2023)   Social Connection and Isolation Panel [NHANES]    Frequency of Communication with Friends and Family: More than three times a week    Frequency of Social Gatherings with Friends and Family: Never    Attends Religious Services: 1 to 4 times per year    Active Member of [employer] or Organizations: No    Attends Engineer, structural: Not on file    Marital Status: Never married  Catering manager Violence: Not on file   Allergy: No Known Allergies Current Outpatient Medications: Medications Prior to Admission  Medication Sig Dispense Refill Last Dose/Taking   aspirin  EC 81 MG tablet Take 2 tablets (162 mg total) by mouth daily. Swallow whole. 60 tablet 12 12/16/2023   docusate sodium  (COLACE) 100 MG capsule Take 100 mg by mouth 2 (two) times daily.   12/16/2023   ferrous sulfate  325 (65 FE) MG tablet Take 1 tablet (325 mg total) by mouth daily. 30 tablet 0 12/16/2023   Prenatal Vit-Fe Fumarate-FA (PRENATAL MULTIVITAMIN) TABS tablet Take 1 tablet by mouth daily at 12 noon.   12/16/2023   acetaminophen  (TYLENOL ) 325 MG tablet Take 2 tablets (650 mg total) by mouth every 6 (six) hours as needed. (Patient not taking: Reported on 10/26/2023)      cyclobenzaprine  (FLEXERIL ) 10 MG tablet Take 1 tablet (10 mg total) by mouth 2 (two) times daily as needed for muscle spasms. 20 tablet 0    metroNIDAZOLE  (FLAGYL ) 500 MG tablet Take 1 tablet (500 mg total) by mouth 2 (two) times daily. 14 tablet 0    ondansetron  (ZOFRAN -ODT) 4 MG disintegrating tablet Take 1 tablet (4 mg total) by mouth every 6 (six) hours as needed for nausea. 20 tablet 0    Hospital Medications: Current Facility-Administered Medications  Medication Dose Route Frequency Provider Last Rate Last Admin   acetaminophen  (TYLENOL ) tablet 650 mg  650 mg Oral Q4H PRN Leveque, Alyssa, MD        calcium  carbonate (TUMS - dosed in mg elemental calcium ) chewable tablet 400 mg of elemental calcium   2 tablet Oral Q4H PRN Leveque, Alyssa, MD       morphine  (PF) 4 MG/ML injection 2 mg  2 mg Intramuscular Q1H PRN Inaya Gillham, MD   2 mg at 12/17/23 1141   sodium chloride  flush (NS) 0.9 % injection 3-10  mL  3-10 mL Intravenous Q12H Darrow End, MD       sodium chloride  flush (NS) 0.9 % injection 3-10 mL  3-10 mL Intravenous PRN Leveque, Alyssa, MD       zolpidem  (AMBIEN ) tablet 5 mg  5 mg Oral QHS PRN Darrow End, MD        Physical Exam:  Current Vital Signs 24h Vital Sign Ranges  T 98 F (36.7 C) Temp  Avg: 97.8 F (36.6 C)  Min: 97.6 F (36.4 C)  Max: 98 F (36.7 C)  BP 139/69 BP  Min: 114/96  Max: 139/69  HR 82 Pulse  Avg: 85  Min: 82  Max: 87  RR 18 Resp  Avg: 19.5  Min: 18  Max: 21  SaO2 100 %   SpO2  Avg: 100 %  Min: 100 %  Max: 100 %       24 Hour I/O Current Shift I/O  Time Ins Outs No intake/output data recorded. No intake/output data recorded.   There is no height or weight on file to calculate BMI. General appearance: Well nourished, well developed female in no acute distress.  Neck:  Supple, normal appearance, and no thyromegaly  Cardiovascular: S1, S2 normal, no murmur, rub or gallop, regular rate and rhythm Respiratory:  Clear to auscultation bilateral. Normal respiratory effort Abdomen: gravid, nttp Neuro/Psych:  Normal mood and affect.  Skin:  Warm and dry.  Extremities: no clubbing, cyanosis, or edema.   Laboratory: Pending t&s, cbc with diff, gc/ct swab Wet prep negative  Imaging:  Read pending but anhydramnios, FHR 150s, breech, CL 3.5cm  Assessment: patient stable with PPROM  Plan: D/w pt re: plan of care. Follow up admit labs and obs for 24 hours due to high risk for going into labor. Indications for delivery d/w, as well as outpatient follow up, viability admission with steroids and abx at approximately 23 weeks.  FHTs  q8h SCDs Regular diet  Total time taking care of the patient was 35 minutes, with greater than 50% of the time spent in face to face interaction with the patient.  Tyler Gallant MD Attending Center for [facility] (Faculty Practice) GYN Consult Phone: [phone] (M-F, 0800-1700) & [phone] (Off hours, weekends, holidays)

## 2023-12-17 NOTE — MAU Note (Signed)
 [name] is a 33 y.o. at [redacted]w[redacted]d here in MAU reporting: patient arrived via ems with complaints of lower abdominal cramping and watery vaginal discharge that started at 0800 this morning. States the pain comes and goes and wraps around to her low back. States she was bearing down to have a BM this am and that is when the fluid started leaking. States she has been constipated over 1 week. States she had trich 2 weeks ago that she completed medication for. Hx of PPROM at 34 weeks. Next OB app 12/22/23    Onset of complaint: 0800 Pain score: 8 Vitals:   12/17/23 0856  BP: (!) 114/96  Pulse: 86  Resp: (!) 21  Temp: 97.6 F (36.4 C)     FHT:138 by bedside US   Lab orders placed from triage:  vaginal swabs

## 2023-12-17 NOTE — Progress Notes (Signed)
 Pt declining IV access at this time. Dr. Cooper Denver notified and ok with patient not having IV at this time.

## 2023-12-18 DIAGNOSIS — O42912 Preterm premature rupture of membranes, unspecified as to length of time between rupture and onset of labor, second trimester: Principal | ICD-10-CM

## 2023-12-18 DIAGNOSIS — Z3A17 17 weeks gestation of pregnancy: Secondary | ICD-10-CM

## 2023-12-18 LAB — CBC
HCT: 31.9 % — ABNORMAL LOW (ref 36.0–46.0)
Hemoglobin: 10.1 g/dL — ABNORMAL LOW (ref 12.0–15.0)
MCH: 24.2 pg — ABNORMAL LOW (ref 26.0–34.0)
MCHC: 31.7 g/dL (ref 30.0–36.0)
MCV: 76.5 fL — ABNORMAL LOW (ref 80.0–100.0)
Platelets: 315 10*3/uL (ref 150–400)
RBC: 4.17 MIL/uL (ref 3.87–5.11)
RDW: 23.2 % — ABNORMAL HIGH (ref 11.5–15.5)
WBC: 8.5 10*3/uL (ref 4.0–10.5)
nRBC: 0 % (ref 0.0–0.2)

## 2023-12-18 NOTE — Progress Notes (Signed)
 CSW was consulted due to housing insecurity ("Pt states she's homeless but is living with her sister at the moment). When CSW attempted to meet with patient, patient had been discharged and was no longer on site.   Please place social work consult if/when patient is readmitted for follow up.   Signed,  Elizabeth Gulling, MSW, LCSWA, LCASA 12/18/2023 11:14 AM

## 2023-12-18 NOTE — Discharge Summary (Addendum)
 Physician Discharge Summary  Patient ID: [name] MRN: [id number] DOB/AGE: February 19, 1991 33 y.o.  Admit date: 12/17/2023 Discharge date: 12/18/2023  Admission Diagnoses:  Discharge Diagnoses:  Active Problems:   PPROM at 17/6 (December 17, 2023)   Anhydramnios   Trichomonal vaginitis in pregnancy in first trimester   Chlamydia trachomatis infection in pregnancy   Discharged Condition: stable  Hospital Course: 33 year old G4 P2103 @ [redacted]w[redacted]d admitted for P PROM.  Patient noted large gush on 12/17/23.  She was admitted overnight for observation for potential delivery.  Patient has no signs of infection, no leukocytosis.  She reports no contractions and no further leaking.  Since patient is nonviable we will plan for close outpatient monitoring with plan for readmission at 22 weeks 6-day once intervention possible.  Patient is already scheduled for follow-up on June 11  Consults: None  Significant Diagnostic Studies: labs:  Results for orders placed or performed during the hospital encounter of 12/17/23 (from the past 24 hours)  Rupture of Membrane (ROM) Plus     Status: None   Collection Time: 12/17/23 10:32 AM  Result Value Ref Range   Rom Plus POSITIVE   Type and screen [facility]     Status: None (Preliminary result)   Collection Time: 12/17/23 11:01 AM  Result Value Ref Range   ABO/RH(D) O POS    Antibody Screen POS    Sample Expiration 12/20/2023,2359    Antibody Identification      ANTI LEA Harles Lied a) ANTI LEB Harles Lied b) Performed at [facility], [address] [place], [place] [address]    Unit Number [id number]    Blood Component Type RED CELLS,LR    Unit division 00    Status of Unit ALLOCATED    Transfusion Status OK TO TRANSFUSE    Crossmatch Result COMPATIBLE    Unit Number [id number]    Blood Component Type RED CELLS,LR    Unit division 00    Status of Unit ALLOCATED    Transfusion Status OK TO TRANSFUSE    Crossmatch Result  COMPATIBLE   CBC with Differential/Platelet     Status: Abnormal   Collection Time: 12/17/23 11:03 AM  Result Value Ref Range   WBC 14.8 (H) 4.0 - 10.5 K/uL   RBC 4.57 3.87 - 5.11 MIL/uL   Hemoglobin 11.0 (L) 12.0 - 15.0 g/dL   HCT 10.2 (L) 72.5 - 36.6 %   MCV 76.4 (L) 80.0 - 100.0 fL   MCH 24.1 (L) 26.0 - 34.0 pg   MCHC 31.5 30.0 - 36.0 g/dL   RDW 44.0 (H) 34.7 - 42.5 %   Platelets 342 150 - 400 K/uL   nRBC 0.0 0.0 - 0.2 %   Neutrophils Relative % 88 %   Neutro Abs 13.0 (H) 1.7 - 7.7 K/uL   Lymphocytes Relative 7 %   Lymphs Abs 1.0 0.7 - 4.0 K/uL   Monocytes Relative 4 %   Monocytes Absolute 0.6 0.1 - 1.0 K/uL   Eosinophils Relative 0 %   Eosinophils Absolute 0.0 0.0 - 0.5 K/uL   Basophils Relative 1 %   Basophils Absolute 0.1 0.0 - 0.1 K/uL   WBC Morphology See Note    RBC Morphology See Note    Smear Review See Note    nRBC 0 0 /100 WBC   Abs Immature Granulocytes 0.00 0.00 - 0.07 K/uL  CBC     Status: Abnormal   Collection Time: 12/18/23  5:18 AM  Result  Value Ref Range   WBC 8.5 4.0 - 10.5 K/uL   RBC 4.17 3.87 - 5.11 MIL/uL   Hemoglobin 10.1 (L) 12.0 - 15.0 g/dL   HCT 16.1 (L) 09.6 - 04.5 %   MCV 76.5 (L) 80.0 - 100.0 fL   MCH 24.2 (L) 26.0 - 34.0 pg   MCHC 31.7 30.0 - 36.0 g/dL   RDW 40.9 (H) 81.1 - 91.4 %   Platelets 315 150 - 400 K/uL   nRBC 0.0 0.0 - 0.2 %     Treatments: fetal monitoring  Discharge Exam: Blood pressure 129/71, pulse 89, temperature 98.5 F (36.9 C), temperature source Oral, resp. rate 18, height 5\' 7"  (1.702 m), weight 78 kg, last menstrual period 08/14/2023, SpO2 99%. General appearance: alert, cooperative, and no distress Resp: clear to auscultation bilaterally Cardio: regular rate and rhythm GI: soft, non-tender, gravid Extremities: edema absent Skin: warm and dry Psych: mood appropriate  Disposition: Discharge disposition: 01-Home or Self Care        Allergies as of 12/18/2023   No Known Allergies      Medication List      TAKE these medications    acetaminophen  325 MG tablet Commonly known as: Tylenol  Take 2 tablets (650 mg total) by mouth every 6 (six) hours as needed.   aspirin  EC 81 MG tablet Take 2 tablets (162 mg total) by mouth daily. Swallow whole.   cyclobenzaprine  10 MG tablet Commonly known as: FLEXERIL  Take 1 tablet (10 mg total) by mouth 2 (two) times daily as needed for muscle spasms.   docusate sodium  100 MG capsule Commonly known as: COLACE Take 100 mg by mouth 2 (two) times daily.   ferrous sulfate  325 (65 FE) MG tablet Take 1 tablet (325 mg total) by mouth daily.   metroNIDAZOLE  500 MG tablet Commonly known as: FLAGYL  Take 1 tablet (500 mg total) by mouth 2 (two) times daily.   ondansetron  4 MG disintegrating tablet Commonly known as: ZOFRAN -ODT Take 1 tablet (4 mg total) by mouth every 6 (six) hours as needed for nausea.   prenatal multivitamin Tabs tablet Take 1 tablet by mouth daily at 12 noon.        Follow-up Information     [facility] for [facility] Follow up.   Specialty: Obstetrics and Gynecology Why: Follow up as scheduled in one week Contact information: [address] [place]  [address] [phone]                Signed: Christel Cousins 12/18/2023, 9:43 AM

## 2023-12-19 LAB — TYPE AND SCREEN
ABO/RH(D): O POS
Antibody Screen: POSITIVE
Unit division: 0
Unit division: 0

## 2023-12-19 LAB — BPAM RBC
Blood Product Expiration Date: 202507142359
Blood Product Expiration Date: 202507162359
Unit Type and Rh: 5100
Unit Type and Rh: 5100

## 2023-12-20 ENCOUNTER — Encounter: Payer: Self-pay | Admitting: Obstetrics and Gynecology

## 2023-12-20 DIAGNOSIS — O361990 Maternal care for other isoimmunization, unspecified trimester, not applicable or unspecified: Secondary | ICD-10-CM | POA: Insufficient documentation

## 2023-12-20 LAB — GC/CHLAMYDIA PROBE AMP (~~LOC~~) NOT AT ARMC
Chlamydia: POSITIVE — AB
Comment: NEGATIVE
Comment: NORMAL
Neisseria Gonorrhea: NEGATIVE

## 2023-12-21 ENCOUNTER — Other Ambulatory Visit: Payer: Self-pay | Admitting: Nurse Practitioner

## 2023-12-22 ENCOUNTER — Ambulatory Visit (INDEPENDENT_AMBULATORY_CARE_PROVIDER_SITE_OTHER): Admitting: Family Medicine

## 2023-12-22 ENCOUNTER — Ambulatory Visit: Payer: Self-pay | Admitting: Obstetrics and Gynecology

## 2023-12-22 ENCOUNTER — Other Ambulatory Visit: Payer: Self-pay

## 2023-12-22 VITALS — BP 131/87 | HR 98 | Wt 170.0 lb

## 2023-12-22 DIAGNOSIS — O0990 Supervision of high risk pregnancy, unspecified, unspecified trimester: Secondary | ICD-10-CM

## 2023-12-22 DIAGNOSIS — O26892 Other specified pregnancy related conditions, second trimester: Secondary | ICD-10-CM

## 2023-12-22 DIAGNOSIS — O42919 Preterm premature rupture of membranes, unspecified as to length of time between rupture and onset of labor, unspecified trimester: Secondary | ICD-10-CM

## 2023-12-22 DIAGNOSIS — A749 Chlamydial infection, unspecified: Secondary | ICD-10-CM

## 2023-12-22 DIAGNOSIS — N898 Other specified noninflammatory disorders of vagina: Secondary | ICD-10-CM

## 2023-12-22 DIAGNOSIS — A568 Sexually transmitted chlamydial infection of other sites: Secondary | ICD-10-CM

## 2023-12-22 DIAGNOSIS — O0992 Supervision of high risk pregnancy, unspecified, second trimester: Secondary | ICD-10-CM | POA: Diagnosis not present

## 2023-12-22 DIAGNOSIS — Z8759 Personal history of other complications of pregnancy, childbirth and the puerperium: Secondary | ICD-10-CM

## 2023-12-22 DIAGNOSIS — O99012 Anemia complicating pregnancy, second trimester: Secondary | ICD-10-CM

## 2023-12-22 DIAGNOSIS — Z3A18 18 weeks gestation of pregnancy: Secondary | ICD-10-CM | POA: Diagnosis not present

## 2023-12-22 DIAGNOSIS — Z8751 Personal history of pre-term labor: Secondary | ICD-10-CM

## 2023-12-22 DIAGNOSIS — Z8679 Personal history of other diseases of the circulatory system: Secondary | ICD-10-CM

## 2023-12-22 MED ORDER — AZITHROMYCIN 500 MG PO TABS: 1000.0000 mg | ORAL_TABLET | Freq: Once | ORAL | 0 refills | Status: AC

## 2023-12-22 MED ORDER — AZITHROMYCIN 500 MG PO TABS
1000.0000 mg | ORAL_TABLET | Freq: Once | ORAL | Status: AC
  Administered 2023-12-22: 1000 mg via ORAL

## 2023-12-22 NOTE — Progress Notes (Signed)
   PRENATAL VISIT NOTE  Subjective:  [name] is a 33 y.o. [phone] at [redacted]w[redacted]d being seen today for ongoing prenatal care.  She is currently monitored for the following issues for this high-risk pregnancy and has Supervision of high risk pregnancy, antepartum; History of preterm delivery; History of postpartum gestational hypertension; PPROM at 17/6 (December 17, 2023); Anhydramnios; Alpha thalassemia silent carrier; Trichomonal vaginitis in pregnancy in first trimester; Chlamydia trachomatis infection in pregnancy; and Lewis isoimmunization during pregnancy on their problem list.  Patient reports no bleeding and no contractions.  Contractions: Not present. Vag. Bleeding: None.  Movement: Present. Denies leaking of fluid.   The following portions of the patient's history were reviewed and updated as appropriate: allergies, current medications, past family history, past medical history, past social history, past surgical history and problem list.   Objective:    Vitals:   12/22/23 1600  BP: 131/87  Pulse: 98  Weight: 170 lb (77.1 kg)    Fetal Status:  Fetal Heart Rate (bpm): 152   Movement: Present    General: Alert, oriented and cooperative. Patient is in no acute distress.  Skin: Skin is warm and dry. No rash noted.   Cardiovascular: Normal heart rate noted  Respiratory: Normal respiratory effort, no problems with respiration noted  Abdomen: Soft, gravid, appropriate for gestational age.  Pain/Pressure: Absent     Pelvic: Cervical exam deferred        Extremities: Normal range of motion.     Mental Status: Normal mood and affect. Normal behavior. Normal judgment and thought content.   Assessment and Plan:  Pregnancy: [id number] at [redacted]w[redacted]d 1. Supervision of high risk pregnancy, antepartum (Primary) FHR BP appropriate today - AFP, Serum, Open Spina Bifida  2. Vaginal discharge during pregnancy in second trimester Patient with known chlamydia and is yet to be treated.  Will treat  today in clinic  3. History of preterm delivery 34-week delivery  4. History of postpartum gestational hypertension BP appropriate today  5. Anemia affecting pregnancy in second trimester Patient prescribed iron supplements  6. Preterm premature rupture of membranes (PPROM) with unknown onset of labor PPROM on 6/6.  Reports she does not think she has been leaking fluid but has had yellow discharge.  Will monitor weekly and patient is scheduled for ultrasound next week.  Plan on admission at [redacted]w[redacted]d to receive betamethasone   7. [redacted] weeks gestation of pregnancy   Preterm labor symptoms and general obstetric precautions including but not limited to vaginal bleeding, contractions, leaking of fluid and fetal movement were reviewed in detail with the patient. Please refer to After Visit Summary for other counseling recommendations.   No follow-ups on file.  Future Appointments  Date Time Provider Department Center  12/27/2023  8:00 AM [facility] PROVIDER 1 [facility]  12/27/2023  8:30 AM [facility] US3 [facility]    Ferdie Housekeeper, MD

## 2023-12-23 ENCOUNTER — Ambulatory Visit

## 2023-12-24 ENCOUNTER — Telehealth: Payer: Self-pay | Admitting: Obstetrics and Gynecology

## 2023-12-24 NOTE — Telephone Encounter (Signed)
 Patient called labor and delivery concerned about abnormal AFP result, I called her and shared the ordering provider will be in touch next week, likely next step is u/s, I did reassure her false positive are possible.

## 2023-12-25 ENCOUNTER — Encounter (HOSPITAL_COMMUNITY): Payer: Self-pay | Admitting: Obstetrics & Gynecology

## 2023-12-25 ENCOUNTER — Inpatient Hospital Stay (HOSPITAL_COMMUNITY)
Admission: AD | Admit: 2023-12-25 | Discharge: 2023-12-27 | DRG: 831 | Disposition: A | Discharge status: Home / Self Care | Attending: Obstetrics and Gynecology | Admitting: Obstetrics and Gynecology

## 2023-12-25 ENCOUNTER — Inpatient Hospital Stay (HOSPITAL_COMMUNITY)

## 2023-12-25 DIAGNOSIS — O42912 Preterm premature rupture of membranes, unspecified as to length of time between rupture and onset of labor, second trimester: Secondary | ICD-10-CM | POA: Diagnosis not present

## 2023-12-25 DIAGNOSIS — Z148 Genetic carrier of other disease: Secondary | ICD-10-CM

## 2023-12-25 DIAGNOSIS — O479 False labor, unspecified: Secondary | ICD-10-CM | POA: Diagnosis not present

## 2023-12-25 DIAGNOSIS — O42919 Preterm premature rupture of membranes, unspecified as to length of time between rupture and onset of labor, unspecified trimester: Principal | ICD-10-CM

## 2023-12-25 DIAGNOSIS — O4102X0 Oligohydramnios, second trimester, not applicable or unspecified: Secondary | ICD-10-CM

## 2023-12-25 DIAGNOSIS — Z3A19 19 weeks gestation of pregnancy: Secondary | ICD-10-CM

## 2023-12-25 DIAGNOSIS — Z7982 Long term (current) use of aspirin: Secondary | ICD-10-CM

## 2023-12-25 DIAGNOSIS — Z833 Family history of diabetes mellitus: Secondary | ICD-10-CM

## 2023-12-25 DIAGNOSIS — Z8249 Family history of ischemic heart disease and other diseases of the circulatory system: Secondary | ICD-10-CM

## 2023-12-25 DIAGNOSIS — O0990 Supervision of high risk pregnancy, unspecified, unspecified trimester: Secondary | ICD-10-CM

## 2023-12-25 LAB — URINALYSIS, ROUTINE W REFLEX MICROSCOPIC
Bacteria, UA: NONE SEEN
Bilirubin Urine: NEGATIVE
Glucose, UA: NEGATIVE mg/dL
Hgb urine dipstick: NEGATIVE
Ketones, ur: NEGATIVE mg/dL
Nitrite: NEGATIVE
Protein, ur: NEGATIVE mg/dL
Specific Gravity, Urine: 1.017 (ref 1.005–1.030)
WBC, UA: >50 WBC/hpf (ref 0–5)
pH: 7 (ref 5.0–8.0)

## 2023-12-25 MED ORDER — FENTANYL CITRATE (PF) 100 MCG/2ML IJ SOLN
50.0000 ug | Freq: Once | INTRAMUSCULAR | Status: AC
  Administered 2023-12-26: 50 ug via INTRAVENOUS
  Filled 2023-12-25: qty 2

## 2023-12-25 MED ORDER — LACTATED RINGERS IV SOLN: INTRAVENOUS | Status: AC

## 2023-12-25 NOTE — MAU Provider Note (Signed)
 Chief Complaint:  Contractions   HPI   Event Date/Time   First Provider Initiated Contact with Patient 12/25/23 2329      [name] is a 33 y.o. [id number] at [redacted]w[redacted]d who presents to maternity admissions via EMS reporting she had ruptured on 6/8 at [redacted]w[redacted]d and is now contracting q 2-3 minutes uncomfortable. .   P[facility]: [facility]   Past Medical History:  Diagnosis Date   Abdominal trauma 10/21/2011   Anemia    Blood transfusion    01/26/09 s/p SVD   Chronic back pain    Dental caries 10/22/2011   Dental referral  IMO SNOMED Dx Update Oct 2024     Depression    not on meds   Encounter for Depo-Provera  contraception 06/15/2012   GERD (gastroesophageal reflux disease)    High-risk pregnancy supervision 10/22/2011   History of preterm delivery, currently pregnant 10/22/2011   Premature dilation and effacement, Rx Prometrium       Mental disorder    NSVD (normal spontaneous vaginal delivery) 12/11/2011   Pelvic pain in female 06/15/2012   Rt aadnexal tenderness ? Thickening> pelvic US      Pregnancy induced hypertension 07/13/2008   required Mag Sulfate   Preterm labor    first pregnancy/current pregnancy   UTI (urinary tract infection) in pregnancy, antepartum    09/2011 tx with abx   OB History  Gravida Para Term Preterm AB Living  4 3 2 1  0 3  SAB IAB Ectopic Multiple Live Births  0 0 0 0 3    # Outcome Date GA Lbr Len/2nd Weight Sex Type Anes PTL Lv  4 Current           3 Term 12/11/11 [redacted]w[redacted]d 273:55 / 00:09 3465 g M Vag-Spont None  LIV  2 Preterm 04/08/10 [redacted]w[redacted]d   M Vag-Spont   LIV     Birth Comments: PPROM at 61 wk,+GBS, IOL at 63 wk  1 Term 10/16/08 [redacted]w[redacted]d   F Vag-Spont   LIV   Past Surgical History:  Procedure Laterality Date   FINGER SURGERY     right thumb in childhood   Family History  Problem Relation Age of Onset   Asthma Mother    Diabetes Father    Hypertension Father    Heart disease Father        afib, died 01/27/20   Mental illness Brother         bipolar, schizophrenia   Anesthesia problems Neg Hx    Social History   Tobacco Use   Smoking status: Some Days    Types: Cigarettes   Smokeless tobacco: Never  Vaping Use   Vaping status: Never Used  Substance Use Topics   Alcohol use: Not Currently    Comment: occ   Drug use: Not Currently   No Known Allergies Medications Prior to Admission  Medication Sig Dispense Refill Last Dose/Taking   aspirin  EC 81 MG tablet Take 2 tablets (162 mg total) by mouth daily. Swallow whole. 60 tablet 12 12/25/2023   ferrous sulfate  325 (65 FE) MG tablet TAKE 1 TABLET BY MOUTH EVERY DAY 30 tablet 0 12/25/2023   Prenatal Vit-Fe Fumarate-FA (PRENATAL MULTIVITAMIN) TABS tablet Take 1 tablet by mouth daily at 12 noon.   12/25/2023   acetaminophen  (TYLENOL ) 325 MG tablet Take 2 tablets (650 mg total) by mouth every 6 (six) hours as needed. (Patient not taking: Reported on 10/26/2023)      cyclobenzaprine  (FLEXERIL ) 10 MG tablet Take 1  tablet (10 mg total) by mouth 2 (two) times daily as needed for muscle spasms. (Patient not taking: Reported on 12/22/2023) 20 tablet 0    docusate sodium  (COLACE) 100 MG capsule Take 100 mg by mouth 2 (two) times daily.      ondansetron  (ZOFRAN -ODT) 4 MG disintegrating tablet Take 1 tablet (4 mg total) by mouth every 6 (six) hours as needed for nausea. (Patient not taking: Reported on 12/22/2023) 20 tablet 0     I have reviewed patient's Past Medical Hx, Surgical Hx, Family Hx, Social Hx, medications and allergies.   ROS  Pertinent items noted in HPI and remainder of comprehensive ROS otherwise negative.   PHYSICAL EXAM  Patient Vitals for the past 24 hrs:  BP Temp Temp src Pulse Resp SpO2 Height Weight  12/25/23 2336 126/74 -- -- 86 17 100 % -- --  12/25/23 2331 132/72 98 F (36.7 C) Oral 89 17 -- -- --  12/25/23 2326 -- -- -- -- -- -- 5' 7 (1.702 m) 78.7 kg    Constitutional: Well-developed, well-nourished female who appears uncomfortable  Cardiovascular: normal  rate & rhythm, warm and well-perfused Respiratory: normal effort, no problems with respiration noted GI: Abd soft, ctx are palpable  MS: Extremities nontender, no edema, normal ROM Neurologic: Alert and oriented x 4.  GU: no CVA tenderness Pelvic: Deferred    Dilation: 1 Exam by:: Debbe Fail, NP  Pt informed that the ultrasound is considered a limited OB ultrasound and is not intended to be a complete ultrasound exam.  Patient also informed that the ultrasound is not being completed with the intent of assessing for fetal or placental anomalies or any pelvic abnormalities.  Explained that the purpose of today's ultrasound is to assess for  viability.  Patient acknowledges the purpose of the exam and the limitations of the study.   FHR at ~150 bpm, Anhydramnios    Labs: Results for orders placed or performed during the hospital encounter of 12/25/23 (from the past 24 hours)  Urinalysis, Routine w reflex microscopic -Urine, Clean Catch     Status: Abnormal   Collection Time: 12/25/23 11:35 PM  Result Value Ref Range   Color, Urine YELLOW YELLOW   APPearance HAZY (A) CLEAR   Specific Gravity, Urine 1.017 1.005 - 1.030   pH 7.0 5.0 - 8.0   Glucose, UA NEGATIVE NEGATIVE mg/dL   Hgb urine dipstick NEGATIVE NEGATIVE   Bilirubin Urine NEGATIVE NEGATIVE   Ketones, ur NEGATIVE NEGATIVE mg/dL   Protein, ur NEGATIVE NEGATIVE mg/dL   Nitrite NEGATIVE NEGATIVE   Leukocytes,Ua LARGE (A) NEGATIVE   RBC / HPF 11-20 0 - 5 RBC/hpf   WBC, UA >50 0 - 5 WBC/hpf   Bacteria, UA NONE SEEN NONE SEEN   Squamous Epithelial / HPF 6-10 0 - 5 /HPF   Mucus PRESENT   CBC     Status: Abnormal   Collection Time: 12/26/23 12:00 AM  Result Value Ref Range   WBC 9.5 4.0 - 10.5 K/uL   RBC 4.16 3.87 - 5.11 MIL/uL   Hemoglobin 10.2 (L) 12.0 - 15.0 g/dL   HCT 52.8 (L) 41.3 - 24.4 %   MCV 76.9 (L) 80.0 - 100.0 fL   MCH 24.5 (L) 26.0 - 34.0 pg   MCHC 31.9 30.0 - 36.0 g/dL   RDW 01.0 (H) 27.2 - 53.6 %   Platelets  289 150 - 400 K/uL   nRBC 0.0 0.0 - 0.2 %  Type and screen [facility]     Status: None (Preliminary result)   Collection Time: 12/26/23 12:00 AM  Result Value Ref Range   ABO/RH(D) PENDING    Antibody Screen PENDING    Sample Expiration      12/29/2023,2359 Performed at [facility], [address] [place], [place] [address]     I  MDM & MAU COURSE  MDM:  HIGH PPROM at [redacted] wk GA with Anhydramnios  likely in PTL  D/W Dr Randolm Butte ( OB Attending) - Admit to [facility]  Pain medication prn  MAU Course: Orders Placed This Encounter  Procedures   Urinalysis, Routine w reflex microscopic -Urine, Clean Catch   RPR   CBC   Type and screen [facility]   Admit to Inpatient (patient's expected length of stay will be greater than 2 midnights or inpatient only procedure)   Meds ordered this encounter  Medications   lactated ringers  infusion   fentaNYL  (SUBLIMAZE ) injection 50 mcg     ASSESSMENT   1. Preterm premature rupture of membranes (PPROM) with unknown onset of labor   2. [redacted] weeks gestation of pregnancy   3. Preterm labor in second trimester without delivery     PLAN   PPROM at [redacted] wk GA with Anhydramnios  likely in PTL  D/W Dr Randolm Butte [facility] Attending) - Admit to [facility]  Pain medication prn Routine admission orders -------------------------------------------------------------------------- Debbe Fail, MSN, [facility], [facility] for [facility]

## 2023-12-25 NOTE — MAU Note (Signed)
 [name] is a 33 y.o. at [redacted]w[redacted]d here in MAU reporting:   Arrived by EMS  No bleeding.  Pt states she started having abdominal pain and ctxs tonight when she laid down.  Ctxs 1 min apart.   Ruptured on 6th of June  Plan on admission at [redacted]w[redacted]d to receive betamethasone  per prenatal records.   Pain score: 7 abdominal pain ctxs.   FHT: 150  Debbe Fail, NP at bedside

## 2023-12-26 ENCOUNTER — Other Ambulatory Visit: Payer: Self-pay

## 2023-12-26 DIAGNOSIS — O4292 Full-term premature rupture of membranes, unspecified as to length of time between rupture and onset of labor: Secondary | ICD-10-CM | POA: Diagnosis not present

## 2023-12-26 DIAGNOSIS — Z148 Genetic carrier of other disease: Secondary | ICD-10-CM | POA: Diagnosis not present

## 2023-12-26 DIAGNOSIS — Z7982 Long term (current) use of aspirin: Secondary | ICD-10-CM | POA: Diagnosis not present

## 2023-12-26 DIAGNOSIS — Z8249 Family history of ischemic heart disease and other diseases of the circulatory system: Secondary | ICD-10-CM | POA: Diagnosis not present

## 2023-12-26 DIAGNOSIS — O09292 Supervision of pregnancy with other poor reproductive or obstetric history, second trimester: Secondary | ICD-10-CM | POA: Diagnosis not present

## 2023-12-26 DIAGNOSIS — O42112 Preterm premature rupture of membranes, onset of labor more than 24 hours following rupture, second trimester: Secondary | ICD-10-CM | POA: Diagnosis not present

## 2023-12-26 DIAGNOSIS — Z3A19 19 weeks gestation of pregnancy: Secondary | ICD-10-CM | POA: Diagnosis not present

## 2023-12-26 DIAGNOSIS — Z833 Family history of diabetes mellitus: Secondary | ICD-10-CM | POA: Diagnosis not present

## 2023-12-26 DIAGNOSIS — O42912 Preterm premature rupture of membranes, unspecified as to length of time between rupture and onset of labor, second trimester: Secondary | ICD-10-CM | POA: Diagnosis not present

## 2023-12-26 LAB — CBC
HCT: 32 % — ABNORMAL LOW (ref 36.0–46.0)
Hemoglobin: 10.2 g/dL — ABNORMAL LOW (ref 12.0–15.0)
MCH: 24.5 pg — ABNORMAL LOW (ref 26.0–34.0)
MCHC: 31.9 g/dL (ref 30.0–36.0)
MCV: 76.9 fL — ABNORMAL LOW (ref 80.0–100.0)
Platelets: 289 10*3/uL (ref 150–400)
RBC: 4.16 MIL/uL (ref 3.87–5.11)
RDW: 22 % — ABNORMAL HIGH (ref 11.5–15.5)
WBC: 9.5 10*3/uL (ref 4.0–10.5)
nRBC: 0 % (ref 0.0–0.2)

## 2023-12-26 LAB — RPR: RPR Ser Ql: NONREACTIVE

## 2023-12-26 MED ORDER — FENTANYL CITRATE (PF) 100 MCG/2ML IJ SOLN
50.0000 ug | INTRAMUSCULAR | Status: DC | PRN
  Administered 2023-12-26 (×3): 50 ug via INTRAVENOUS
  Filled 2023-12-26 (×4): qty 2

## 2023-12-26 MED ORDER — SODIUM CHLORIDE 0.9 % IV SOLN
12.5000 mg | Freq: Four times a day (QID) | INTRAVENOUS | Status: DC | PRN
  Administered 2023-12-26 (×2): 12.5 mg via INTRAVENOUS
  Filled 2023-12-26: qty 0.5
  Filled 2023-12-26: qty 12.5
  Filled 2023-12-26 (×2): qty 0.5

## 2023-12-26 MED ORDER — PROMETHAZINE HCL 25 MG PO TABS: 12.5000 mg | ORAL_TABLET | ORAL | Status: DC | PRN

## 2023-12-26 MED ORDER — DEXTROSE IN LACTATED RINGERS 5 % IV SOLN: INTRAVENOUS | Status: DC

## 2023-12-26 MED ORDER — ZOLPIDEM TARTRATE 5 MG PO TABS
5.0000 mg | ORAL_TABLET | Freq: Every evening | ORAL | Status: DC | PRN
  Administered 2023-12-26: 5 mg via ORAL
  Filled 2023-12-26: qty 1

## 2023-12-26 MED ORDER — IBUPROFEN 800 MG PO TABS
800.0000 mg | ORAL_TABLET | Freq: Once | ORAL | Status: AC
  Administered 2023-12-26: 800 mg via ORAL
  Filled 2023-12-26: qty 1

## 2023-12-26 NOTE — H&P (Signed)
 Chief Complaint:  Contractions     HPI   Event Date/Time   First Provider Initiated Contact with Patient 12/25/23 2329       [name] is a 33 y.o. [id number] at [redacted]w[redacted]d who presents to maternity admissions via EMS reporting she had ruptured on 6/8 at [redacted]w[redacted]d and is now contracting q 2-3 minutes uncomfortable. .    [facility]: [facility]        Past Medical History:  Diagnosis Date   Abdominal trauma 10/21/2011   Anemia     Blood transfusion      01-10-2009 s/p SVD   Chronic back pain     Dental caries 10/22/2011    Dental referral  IMO SNOMED Dx Update Oct 2024     Depression      not on meds   Encounter for Depo-Provera  contraception 06/15/2012   GERD (gastroesophageal reflux disease)     High-risk pregnancy supervision 10/22/2011   History of preterm delivery, currently pregnant 10/22/2011    Premature dilation and effacement, Rx Prometrium       Mental disorder     NSVD (normal spontaneous vaginal delivery) 12/11/2011   Pelvic pain in female 06/15/2012    Rt aadnexal tenderness ? Thickening> pelvic US      Pregnancy induced hypertension 07/13/2008    required Mag Sulfate   Preterm labor      first pregnancy/current pregnancy   UTI (urinary tract infection) in pregnancy, antepartum      09/2011 tx with abx                         OB History  Gravida Para Term Preterm AB Living   4 3 2 1  0 3   SAB IAB Ectopic Multiple Live Births      0 0 0 0 3         # Outcome Date GA Lbr Len/2nd Weight Sex Type Anes PTL Lv  4 Current                    3 Term 12/11/11 [redacted]w[redacted]d 273:55 / 00:09 3465 g M Vag-Spont None   LIV  2 Preterm 04/08/10 [redacted]w[redacted]d     M Vag-Spont     LIV     Birth Comments: PPROM at 87 wk,+GBS, IOL at 3 wk  1 Term 10/16/08 [redacted]w[redacted]d     F Vag-Spont     LIV         Past Surgical History:  Procedure Laterality Date   FINGER SURGERY        right thumb in childhood             Family History  Problem Relation Age of Onset   Asthma Mother     Diabetes  Father     Hypertension Father     Heart disease Father          afib, died Jan 11, 2020   Mental illness Brother          bipolar, schizophrenia   Anesthesia problems Neg Hx          Social History  Social History         Tobacco Use   Smoking status: Some Days      Types: Cigarettes   Smokeless tobacco: Never  Vaping Use   Vaping status: Never Used  Substance Use Topics   Alcohol use: Not Currently      Comment: occ   Drug  use: Not Currently      Allergies  No Known Allergies          Medications Prior to Admission  Medication Sig Dispense Refill Last Dose/Taking   aspirin  EC 81 MG tablet Take 2 tablets (162 mg total) by mouth daily. Swallow whole. 60 tablet 12 12/25/2023   ferrous sulfate  325 (65 FE) MG tablet TAKE 1 TABLET BY MOUTH EVERY DAY 30 tablet 0 12/25/2023   Prenatal Vit-Fe Fumarate-FA (PRENATAL MULTIVITAMIN) TABS tablet Take 1 tablet by mouth daily at 12 noon.     12/25/2023   acetaminophen  (TYLENOL ) 325 MG tablet Take 2 tablets (650 mg total) by mouth every 6 (six) hours as needed. (Patient not taking: Reported on 10/26/2023)         cyclobenzaprine  (FLEXERIL ) 10 MG tablet Take 1 tablet (10 mg total) by mouth 2 (two) times daily as needed for muscle spasms. (Patient not taking: Reported on 12/22/2023) 20 tablet 0     docusate sodium  (COLACE) 100 MG capsule Take 100 mg by mouth 2 (two) times daily.         ondansetron  (ZOFRAN -ODT) 4 MG disintegrating tablet Take 1 tablet (4 mg total) by mouth every 6 (six) hours as needed for nausea. (Patient not taking: Reported on 12/22/2023) 20 tablet 0            I have reviewed patient's Past Medical Hx, Surgical Hx, Family Hx, Social Hx, medications and allergies.    ROS  Pertinent items noted in HPI and remainder of comprehensive ROS otherwise negative.    PHYSICAL EXAM  Patient Vitals for the past 24 hrs:   BP Temp Temp src Pulse Resp SpO2 Height Weight  12/25/23 2336 126/74 -- -- 86 17 100 % -- --  12/25/23 2331 132/72 98  F (36.7 C) Oral 89 17 -- -- --  12/25/23 2326 -- -- -- -- -- -- 5' 7 (1.702 m) 78.7 kg      Constitutional: Well-developed, well-nourished female who appears uncomfortable  Cardiovascular: normal rate & rhythm, warm and well-perfused Respiratory: normal effort, no problems with respiration noted GI: Abd soft, ctx are palpable  MS: Extremities nontender, no edema, normal ROM Neurologic: Alert and oriented x 4.  GU: no CVA tenderness Pelvic: Deferred               Dilation: 1 Exam by:: Debbe Fail, NP   Pt informed that the ultrasound is considered a limited OB ultrasound and is not intended to be a complete ultrasound exam.  Patient also informed that the ultrasound is not being completed with the intent of assessing for fetal or placental anomalies or any pelvic abnormalities.  Explained that the purpose of today's ultrasound is to assess for  viability.  Patient acknowledges the purpose of the exam and the limitations of the study.   FHR at ~150 bpm, Anhydramnios    Labs: Lab Results Last 24 Hours        Results for orders placed or performed during the hospital encounter of 12/25/23 (from the past 24 hours)  Urinalysis, Routine w reflex microscopic -Urine, Clean Catch     Status: Abnormal    Collection Time: 12/25/23 11:35 PM  Result Value Ref Range    Color, Urine YELLOW YELLOW    APPearance HAZY (A) CLEAR    Specific Gravity, Urine 1.017 1.005 - 1.030    pH 7.0 5.0 - 8.0    Glucose, UA NEGATIVE NEGATIVE mg/dL    Hgb urine dipstick NEGATIVE  NEGATIVE    Bilirubin Urine NEGATIVE NEGATIVE    Ketones, ur NEGATIVE NEGATIVE mg/dL    Protein, ur NEGATIVE NEGATIVE mg/dL    Nitrite NEGATIVE NEGATIVE    Leukocytes,Ua LARGE (A) NEGATIVE    RBC / HPF 11-20 0 - 5 RBC/hpf    WBC, UA >50 0 - 5 WBC/hpf    Bacteria, UA NONE SEEN NONE SEEN    Squamous Epithelial / HPF 6-10 0 - 5 /HPF    Mucus PRESENT    CBC     Status: Abnormal    Collection Time: 12/26/23 12:00 AM  Result Value Ref  Range    WBC 9.5 4.0 - 10.5 K/uL    RBC 4.16 3.87 - 5.11 MIL/uL    Hemoglobin 10.2 (L) 12.0 - 15.0 g/dL    HCT 16.1 (L) 09.6 - 46.0 %    MCV 76.9 (L) 80.0 - 100.0 fL    MCH 24.5 (L) 26.0 - 34.0 pg    MCHC 31.9 30.0 - 36.0 g/dL    RDW 04.5 (H) 40.9 - 15.5 %    Platelets 289 150 - 400 K/uL    nRBC 0.0 0.0 - 0.2 %  Type and screen [facility]     Status: None (Preliminary result)    Collection Time: 12/26/23 12:00 AM  Result Value Ref Range    ABO/RH(D) PENDING      Antibody Screen PENDING      Sample Expiration          12/29/2023,2359 Performed at [facility], [address] [place], [place] [address]          I   MDM & MAU COURSE  MDM:   HIGH PPROM at [redacted] wk GA with Anhydramnios  likely in PTL   D/W Dr Randolm Butte ( OB Attending) - Admit to [facility]  Pain medication prn   MAU Course:    Orders Placed This Encounter  Procedures   Urinalysis, Routine w reflex microscopic -Urine, Clean Catch   RPR   CBC   Type and screen [facility]   Admit to Inpatient (patient's expected length of stay will be greater than 2 midnights or inpatient only procedure)       Meds ordered this encounter  Medications   lactated ringers  infusion   fentaNYL  (SUBLIMAZE ) injection 50 mcg        ASSESSMENT    1. Preterm premature rupture of membranes (PPROM) with unknown onset of labor   2. [redacted] weeks gestation of pregnancy   3. Preterm labor in second trimester without delivery       PLAN    PPROM at [redacted] wk GA with Anhydramnios  likely in PTL   D/W Dr Randolm Butte [facility] Attending) - Admit to [facility]  Pain medication prn Routine admission orders -------------------------------------------------------------------------- Debbe Fail, MSN, [facility], Center for [facility]             Cosigned by: Wendelyn Halter, MD at 12/26/2023  4:28 AM  Wendelyn Halter, MD 12/26/2023 4:36 AM

## 2023-12-26 NOTE — Progress Notes (Addendum)
 Patient has denied cramping for most of the day today but has had low grade back pain throughout the day. Endorsed 5/10 pain in lower abdomen starting around 1500 that pt got 50 mcg fentanyl  for, which was effective. Denies bleeding but endorses leakage of small amount of clear fluid this shift.

## 2023-12-27 ENCOUNTER — Other Ambulatory Visit

## 2023-12-27 ENCOUNTER — Encounter: Admitting: Family Medicine

## 2023-12-27 ENCOUNTER — Other Ambulatory Visit: Payer: Self-pay | Admitting: Family Medicine

## 2023-12-27 ENCOUNTER — Inpatient Hospital Stay (HOSPITAL_BASED_OUTPATIENT_CLINIC_OR_DEPARTMENT_OTHER)

## 2023-12-27 DIAGNOSIS — O4292 Full-term premature rupture of membranes, unspecified as to length of time between rupture and onset of labor: Secondary | ICD-10-CM | POA: Diagnosis not present

## 2023-12-27 DIAGNOSIS — O42112 Preterm premature rupture of membranes, onset of labor more than 24 hours following rupture, second trimester: Secondary | ICD-10-CM

## 2023-12-27 DIAGNOSIS — Z3A19 19 weeks gestation of pregnancy: Secondary | ICD-10-CM | POA: Diagnosis not present

## 2023-12-27 DIAGNOSIS — Z8751 Personal history of pre-term labor: Secondary | ICD-10-CM

## 2023-12-27 DIAGNOSIS — O0990 Supervision of high risk pregnancy, unspecified, unspecified trimester: Secondary | ICD-10-CM

## 2023-12-27 DIAGNOSIS — O09292 Supervision of pregnancy with other poor reproductive or obstetric history, second trimester: Secondary | ICD-10-CM | POA: Diagnosis not present

## 2023-12-27 DIAGNOSIS — R772 Abnormality of alphafetoprotein: Secondary | ICD-10-CM | POA: Insufficient documentation

## 2023-12-27 DIAGNOSIS — Z8759 Personal history of other complications of pregnancy, childbirth and the puerperium: Secondary | ICD-10-CM

## 2023-12-27 LAB — CBC WITH DIFFERENTIAL/PLATELET
Abs Immature Granulocytes: 0.03 10*3/uL (ref 0.00–0.07)
Basophils Absolute: 0.1 10*3/uL (ref 0.0–0.1)
Basophils Relative: 1 %
Eosinophils Absolute: 0.2 10*3/uL (ref 0.0–0.5)
Eosinophils Relative: 2 %
HCT: 32.1 % — ABNORMAL LOW (ref 36.0–46.0)
Hemoglobin: 10.2 g/dL — ABNORMAL LOW (ref 12.0–15.0)
Immature Granulocytes: 0 %
Lymphocytes Relative: 28 %
Lymphs Abs: 2.5 10*3/uL (ref 0.7–4.0)
MCH: 24.2 pg — ABNORMAL LOW (ref 26.0–34.0)
MCHC: 31.8 g/dL (ref 30.0–36.0)
MCV: 76.2 fL — ABNORMAL LOW (ref 80.0–100.0)
Monocytes Absolute: 0.6 10*3/uL (ref 0.1–1.0)
Monocytes Relative: 7 %
Neutro Abs: 5.4 10*3/uL (ref 1.7–7.7)
Neutrophils Relative %: 62 %
Platelets: 324 10*3/uL (ref 150–400)
RBC: 4.21 MIL/uL (ref 3.87–5.11)
RDW: 21.8 % — ABNORMAL HIGH (ref 11.5–15.5)
Smear Review: NORMAL
WBC: 8.8 10*3/uL (ref 4.0–10.5)
nRBC: 0 % (ref 0.0–0.2)

## 2023-12-27 MED ORDER — POLYETHYLENE GLYCOL 3350 17 G PO PACK: 17.0000 g | PACK | ORAL | 0 refills | Status: AC

## 2023-12-27 MED ORDER — ACETAMINOPHEN 500 MG PO TABS
1000.0000 mg | ORAL_TABLET | Freq: Three times a day (TID) | ORAL | Status: DC | PRN
  Administered 2023-12-27: 1000 mg via ORAL
  Filled 2023-12-27: qty 2

## 2023-12-27 NOTE — Discharge Summary (Incomplete)
 Physician Discharge Summary  Patient ID: ZYKIA WALLA MRN: [id number] DOB/AGE: 1990/12/12 33 y.o.  Admit date: 12/25/2023 Discharge date: 12/27/2023  Admission Diagnoses:  Discharge Diagnoses:  Principal Problem:   Preterm labor   Discharged Condition: {condition:18240}  Hospital Course: ***  Consults: {consultation:18241}  Significant Diagnostic Studies: {diagnostics:18242}  Treatments: {Tx:18249}  Discharge Exam: Blood pressure 127/77, pulse 88, temperature 98.3 F (36.8 C), temperature source Tympanic, resp. rate 18, height 5' 7 (1.702 m), weight 78.7 kg, last menstrual period 08/14/2023, SpO2 100%. {physical WUJW:1191478}  Disposition: Discharge disposition: 01-Home or Self Care       Discharge Instructions     Discharge patient   Complete by: As directed    Discharge disposition: 01-Home or Self Care   Discharge patient date: 12/27/2023      Allergies as of 12/27/2023   No Known Allergies      Medication List     STOP taking these medications    docusate sodium  100 MG capsule Commonly known as: COLACE       TAKE these medications    acetaminophen  325 MG tablet Commonly known as: Tylenol  Take 2 tablets (650 mg total) by mouth every 6 (six) hours as needed.   aspirin  EC 81 MG tablet Take 2 tablets (162 mg total) by mouth daily. Swallow whole.   cyclobenzaprine  10 MG tablet Commonly known as: FLEXERIL  Take 1 tablet (10 mg total) by mouth 2 (two) times daily as needed for muscle spasms.   ferrous sulfate  325 (65 FE) MG tablet TAKE 1 TABLET BY MOUTH EVERY DAY   ondansetron  4 MG disintegrating tablet Commonly known as: ZOFRAN -ODT Take 1 tablet (4 mg total) by mouth every 6 (six) hours as needed for nausea.   polyethylene glycol 17 g packet Commonly known as: MiraLax Take 17 g by mouth every 3 (three) days.   prenatal multivitamin Tabs tablet Take 1 tablet by mouth daily at 12 noon.        Follow-up Information     Center  for [facility]. Go on 01/03/2024.   Specialty: Obstetrics and Gynecology Contact information: [address] [place]  [phone]                Signed: Raynell Caller 12/27/2023, 11:47 AM

## 2023-12-27 NOTE — Progress Notes (Addendum)
 Daily Antepartum Note  Admission Date: 12/25/2023 Current Date: 12/27/2023 8:48 AM  [name] is a 33 y.o. [id number] at [redacted]w[redacted]d, admitted for PPROM at 17/6.  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   Objective:    Current Vital Signs 24h Vital Sign Ranges  T 98.3 F (36.8 C) Temp  Avg: 98.1 F (36.7 C)  Min: 97.7 F (36.5 C)  Max: 98.3 F (36.8 C)  BP 127/77 BP  Min: 111/62  Max: 133/67  HR 88 Pulse  Avg: 88.4  Min: 83  Max: 94  RR 18 Resp  Avg: 17  Min: 16  Max: 18  SaO2 100 % Room Air SpO2  Avg: 99.6 %  Min: 98 %  Max: 100 %       24 Hour I/O Current Shift I/O  Time Ins Outs 06/15 0701 - 06/16 0700 In: 2410.7 [I.V.:2410.7] Out: -  No intake/output data recorded.   Patient Vitals for the past 24 hrs:  BP Temp Temp src Pulse Resp SpO2  12/27/23 0818 127/77 98.3 F (36.8 C) Tympanic 88 18 100 %  12/26/23 2328 111/62 98.1 F (36.7 C) Oral 85 18 100 %  12/26/23 2001 133/67 97.7 F (36.5 C) Oral 83 16 98 %  12/26/23 1608 129/80 98.2 F (36.8 C) Oral 94 16 100 %  12/26/23 1159 115/60 98.1 F (36.7 C) Oral 92 17 100 %    Fetal Heart Tones: RN unable to get FHTs so I did a bedside u/s (see below) and it was appropriate Tocometry: n/a  Physical exam: General: Well nourished, well  developed female in no acute distress. GU: SVE deferred. 1cm on admit. No description of effacement and presumably this was a digital check Abdomen: gravid nttp Respiratory: no respiratory distress Skin: Warm and dry.   Medications: Current Facility-Administered Medications  Medication Dose Route Frequency Provider Last Rate Last Admin   fentaNYL  (SUBLIMAZE ) injection 50 mcg  50 mcg Intravenous Q2H PRN Eure, Luther H, MD   50 mcg at 12/27/23 8469   promethazine  (PHENERGAN ) 12.5 mg in sodium chloride  0.9 % 50 mL IVPB  12.5 mg Intravenous Q6H PRN Wendelyn Halter, MD 150 mL/hr at 12/26/23 1526 12.5 mg at 12/26/23 1526   zolpidem  (AMBIEN ) tablet 5 mg  5 mg Oral QHS PRN Wendelyn Halter, MD   5 mg at 12/26/23 2336   Labs:  Recent Labs  Lab 12/26/23 0000  WBC 9.5  HGB 10.2*  HCT 32.0*  PLT 289   Radiology:  Bedside transabdominal u/s with SLIUP, FHR 150s, +FM, breech, anhydramnios, cervix appears closed and long  Assessment & Plan:  Patient stable *Pregnancy: pt had anatomy u/s at [facility] this morning so will do today. *PPROM: repeat CBC this morning. If anatomy u/s with no concerning aspects and normal WBC, I told her that she is fine for  continued weekly outpatient management with plan for admit for BMZ at 22/5 weeks.  *Abnormal afp: d/w her nothing to do in regards to this except f/u anatomy u/s and c/w her having had PPROM *Recent chlamydia: Meds sent in on 6/11 and confirmed with patient today that she took them *Recent trich: negative TOC last admit  *PPx: SCDs *FEN/GI: regular diet, saline lock IV *Dispo: likely later today  Tyler Gallant MD Attending Center for [facility] (Faculty Practice) GYN Consult Phone: [phone] (M-F, [phone]) & [phone]  (Off hours, weekends, holidays)

## 2023-12-27 NOTE — Progress Notes (Signed)
 Patient currently hospitalized because of rupture of membranes.  Positive AFP.  Discussed referral to genetics and detailed imaging of spine.  Referral placed for genetics.  Will contact second attending to see if she can get the ultrasound while hospitalized.

## 2023-12-28 ENCOUNTER — Inpatient Hospital Stay (HOSPITAL_COMMUNITY)
Admission: AD | Admit: 2023-12-28 | Discharge: 2023-12-31 | DRG: 832 | Disposition: A | Discharge status: Home / Self Care | Payer: Self-pay | Attending: Obstetrics and Gynecology | Admitting: Obstetrics and Gynecology

## 2023-12-28 ENCOUNTER — Encounter (HOSPITAL_COMMUNITY): Payer: Self-pay | Admitting: Obstetrics and Gynecology

## 2023-12-28 DIAGNOSIS — O26892 Other specified pregnancy related conditions, second trimester: Secondary | ICD-10-CM | POA: Diagnosis not present

## 2023-12-28 DIAGNOSIS — N390 Urinary tract infection, site not specified: Secondary | ICD-10-CM | POA: Diagnosis present

## 2023-12-28 DIAGNOSIS — Z8249 Family history of ischemic heart disease and other diseases of the circulatory system: Secondary | ICD-10-CM

## 2023-12-28 DIAGNOSIS — R109 Unspecified abdominal pain: Secondary | ICD-10-CM | POA: Diagnosis not present

## 2023-12-28 DIAGNOSIS — O321XX0 Maternal care for breech presentation, not applicable or unspecified: Secondary | ICD-10-CM | POA: Diagnosis not present

## 2023-12-28 DIAGNOSIS — R112 Nausea with vomiting, unspecified: Secondary | ICD-10-CM | POA: Diagnosis not present

## 2023-12-28 DIAGNOSIS — O42912 Preterm premature rupture of membranes, unspecified as to length of time between rupture and onset of labor, second trimester: Secondary | ICD-10-CM | POA: Diagnosis not present

## 2023-12-28 DIAGNOSIS — K5900 Constipation, unspecified: Secondary | ICD-10-CM | POA: Diagnosis not present

## 2023-12-28 DIAGNOSIS — Z148 Genetic carrier of other disease: Secondary | ICD-10-CM | POA: Diagnosis not present

## 2023-12-28 DIAGNOSIS — O4102X0 Oligohydramnios, second trimester, not applicable or unspecified: Secondary | ICD-10-CM | POA: Diagnosis present

## 2023-12-28 DIAGNOSIS — Z3A19 19 weeks gestation of pregnancy: Secondary | ICD-10-CM | POA: Diagnosis not present

## 2023-12-28 DIAGNOSIS — O42919 Preterm premature rupture of membranes, unspecified as to length of time between rupture and onset of labor, unspecified trimester: Secondary | ICD-10-CM | POA: Diagnosis present

## 2023-12-28 DIAGNOSIS — R1111 Vomiting without nausea: Secondary | ICD-10-CM | POA: Diagnosis not present

## 2023-12-28 DIAGNOSIS — O2342 Unspecified infection of urinary tract in pregnancy, second trimester: Secondary | ICD-10-CM | POA: Diagnosis present

## 2023-12-28 DIAGNOSIS — Z833 Family history of diabetes mellitus: Secondary | ICD-10-CM

## 2023-12-28 DIAGNOSIS — Z8751 Personal history of pre-term labor: Secondary | ICD-10-CM

## 2023-12-28 DIAGNOSIS — Z7982 Long term (current) use of aspirin: Secondary | ICD-10-CM

## 2023-12-28 DIAGNOSIS — R110 Nausea: Secondary | ICD-10-CM | POA: Diagnosis not present

## 2023-12-28 DIAGNOSIS — Z5982 Transportation insecurity: Secondary | ICD-10-CM

## 2023-12-28 DIAGNOSIS — Z8759 Personal history of other complications of pregnancy, childbirth and the puerperium: Secondary | ICD-10-CM

## 2023-12-28 DIAGNOSIS — R531 Weakness: Secondary | ICD-10-CM | POA: Diagnosis not present

## 2023-12-28 DIAGNOSIS — G4489 Other headache syndrome: Secondary | ICD-10-CM | POA: Diagnosis not present

## 2023-12-28 DIAGNOSIS — Z8679 Personal history of other diseases of the circulatory system: Secondary | ICD-10-CM

## 2023-12-28 DIAGNOSIS — Z5986 Financial insecurity: Secondary | ICD-10-CM

## 2023-12-28 DIAGNOSIS — O0990 Supervision of high risk pregnancy, unspecified, unspecified trimester: Principal | ICD-10-CM

## 2023-12-28 DIAGNOSIS — O26899 Other specified pregnancy related conditions, unspecified trimester: Secondary | ICD-10-CM | POA: Diagnosis present

## 2023-12-28 LAB — TYPE AND SCREEN
ABO/RH(D): O POS
Antibody Screen: POSITIVE
Unit division: 0
Unit division: 0

## 2023-12-28 LAB — BPAM RBC
Blood Product Expiration Date: 202507112359
Blood Product Expiration Date: 202507112359
ISSUE DATE / TIME: 202506150154
Unit Type and Rh: 5100
Unit Type and Rh: 5100

## 2023-12-28 LAB — CBC
HCT: 35 % — ABNORMAL LOW (ref 36.0–46.0)
Hemoglobin: 11 g/dL — ABNORMAL LOW (ref 12.0–15.0)
MCH: 24.1 pg — ABNORMAL LOW (ref 26.0–34.0)
MCHC: 31.4 g/dL (ref 30.0–36.0)
MCV: 76.8 fL — ABNORMAL LOW (ref 80.0–100.0)
Platelets: 357 10*3/uL (ref 150–400)
RBC: 4.56 MIL/uL (ref 3.87–5.11)
RDW: 21.9 % — ABNORMAL HIGH (ref 11.5–15.5)
WBC: 9.6 10*3/uL (ref 4.0–10.5)
nRBC: 0 % (ref 0.0–0.2)

## 2023-12-28 MED ORDER — CALCIUM CARBONATE ANTACID 500 MG PO CHEW: 2.0000 | CHEWABLE_TABLET | ORAL | Status: DC | PRN

## 2023-12-28 MED ORDER — ACETAMINOPHEN-CAFFEINE 500-65 MG PO TABS
2.0000 | ORAL_TABLET | Freq: Once | ORAL | Status: AC
  Administered 2023-12-28: 2 via ORAL
  Filled 2023-12-28: qty 2

## 2023-12-28 MED ORDER — ONDANSETRON 4 MG PO TBDP
8.0000 mg | ORAL_TABLET | Freq: Once | ORAL | Status: AC
  Administered 2023-12-28: 8 mg via ORAL
  Filled 2023-12-28: qty 2

## 2023-12-28 MED ORDER — DIPHENHYDRAMINE HCL 25 MG PO CAPS
25.0000 mg | ORAL_CAPSULE | Freq: Once | ORAL | Status: AC
  Administered 2023-12-28: 25 mg via ORAL
  Filled 2023-12-28: qty 1

## 2023-12-28 MED ORDER — BISACODYL 5 MG PO TBEC
5.0000 mg | DELAYED_RELEASE_TABLET | Freq: Every day | ORAL | Status: AC
  Administered 2023-12-28 – 2023-12-29 (×2): 5 mg via ORAL
  Filled 2023-12-28 (×2): qty 1

## 2023-12-28 MED ORDER — MORPHINE SULFATE (PF) 4 MG/ML IV SOLN
2.0000 mg | Freq: Once | INTRAVENOUS | Status: AC
  Administered 2023-12-28: 2 mg via INTRAVENOUS
  Filled 2023-12-28: qty 1

## 2023-12-28 MED ORDER — ACETAMINOPHEN 325 MG PO TABS
650.0000 mg | ORAL_TABLET | ORAL | Status: DC | PRN
  Administered 2023-12-28 – 2023-12-31 (×7): 650 mg via ORAL
  Filled 2023-12-28 (×7): qty 2

## 2023-12-28 MED ORDER — POLYETHYLENE GLYCOL 3350 17 G PO PACK
17.0000 g | PACK | ORAL | Status: DC
  Administered 2023-12-28 – 2023-12-30 (×2): 17 g via ORAL
  Filled 2023-12-28 (×2): qty 1

## 2023-12-28 MED ORDER — SODIUM CHLORIDE 0.9% FLUSH: 3.0000 mL | INTRAVENOUS | Status: DC | PRN

## 2023-12-28 MED ORDER — PRENATAL MULTIVITAMIN CH
1.0000 | ORAL_TABLET | Freq: Every day | ORAL | Status: DC
Start: 2023-12-28 — End: 2023-12-31
  Administered 2023-12-28 – 2023-12-31 (×4): 1 via ORAL
  Filled 2023-12-28 (×4): qty 1

## 2023-12-28 MED ORDER — SODIUM CHLORIDE 0.9% FLUSH
3.0000 mL | Freq: Two times a day (BID) | INTRAVENOUS | Status: DC
  Administered 2023-12-28: 3 mL via INTRAVENOUS
  Administered 2023-12-28 – 2023-12-30 (×4): 10 mL via INTRAVENOUS

## 2023-12-28 NOTE — H&P (Signed)
 FACULTY PRACTICE ANTEPARTUM ADMISSION HISTORY AND PHYSICAL NOTE   History of Present Illness: [name] is a 33 y.o. [phone] at [redacted]w[redacted]d admitted for previable PPROM with abdominal pain.  Onset of pain 0400 this AM, mild at first and then progressively worsened.  Patient reports the fetal movement as active. Patient reports uterine contraction  activity as none. Patient reports  vaginal bleeding as scant staining. Patient describes fluid per vagina as Other Creamy/yellow. Fetal presentation is transverse.  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   PPROM at 17/6 (December 17, 2023) 12/17/2023   Anhydramnios 12/17/2023   Alpha thalassemia silent carrier 12/17/2023   Trichomonal vaginitis in pregnancy in first trimester 12/17/2023   Chlamydia trachomatis infection in pregnancy 12/17/2023   History of preterm delivery 11/24/2023   History of postpartum gestational hypertension 11/24/2023   Supervision of high risk pregnancy, antepartum 10/26/2023    Past Medical History:  Diagnosis Date   Abdominal trauma 10/21/2011   Anemia    Blood transfusion    2010 s/p SVD   Chronic back pain    Dental caries 10/22/2011   Dental referral  IMO SNOMED Dx Update Oct 2024     Depression    not on meds   Encounter for Depo-Provera  contraception 06/15/2012   GERD (gastroesophageal reflux disease)    High-risk pregnancy supervision 10/22/2011   History of preterm delivery, currently pregnant 10/22/2011   Premature dilation and effacement, Rx Prometrium       Mental disorder    NSVD (normal spontaneous vaginal delivery) 12/11/2011   Pelvic pain in female 06/15/2012   Rt aadnexal tenderness ? Thickening> pelvic US      Pregnancy induced hypertension 07/13/2008   required Mag Sulfate   Preterm labor    first pregnancy/current pregnancy   UTI (urinary tract infection) in pregnancy,  antepartum    09/2011 tx with abx    Past Surgical History:  Procedure Laterality Date   FINGER SURGERY     right thumb in childhood    OB History  Gravida Para Term Preterm AB Living  4 3 2 1  0 3  SAB IAB Ectopic Multiple Live Births  0 0 0 0 3    # Outcome Date GA Lbr Len/2nd Weight Sex Type Anes PTL Lv  4 Current           3 Term 12/11/11 [redacted]w[redacted]d 273:55 / 00:09 3465 g M Vag-Spont None  LIV  2 Preterm 04/08/10 [redacted]w[redacted]d   M Vag-Spont   LIV     Birth Comments: PPROM at 79 wk,+GBS, IOL at 79 wk  1 Term 10/16/08 [redacted]w[redacted]d   F Vag-Spont   LIV    Social History   Socioeconomic History   Marital status: Single    Spouse name: Not on file   Number of children: Not on file   Years of education: Not on file   Highest education level: 10th grade  Occupational History   Not on file  Tobacco Use   Smoking status: Some Days    Types: Cigarettes   Smokeless tobacco: Never  Vaping Use   Vaping status: Never Used  Substance and Sexual Activity   Alcohol use: Not Currently    Comment: occ   Drug use: Not Currently   Sexual activity: Not Currently    Birth control/protection: None  Other Topics Concern   Not on file  Social  History Narrative   Not on file   Social Drivers of Health   Financial Resource Strain: High Risk (11/24/2023)   Overall Financial Resource Strain (CARDIA)    Difficulty of Paying Living Expenses: Very hard  Food Insecurity: Food Insecurity Present (12/26/2023)   Hunger Vital Sign    Worried About Running Out of Food in the Last Year: Never true    Ran Out of Food in the Last Year: Sometimes true  Transportation Needs: Unmet Transportation Needs (12/26/2023)   PRAPARE - Administrator, Civil Service (Medical): Yes    Lack of Transportation (Non-Medical): Yes  Physical Activity: Insufficiently Active (11/24/2023)   Exercise Vital Sign    Days of Exercise per Week: 1 day    Minutes of Exercise per Session: 40 min  Stress: No Stress Concern Present  (11/24/2023)   [employer] of Occupational Health - Occupational Stress Questionnaire    Feeling of Stress : Only a little  Social Connections: Moderately Isolated (11/24/2023)   Social Connection and Isolation Panel    Frequency of Communication with Friends and Family: More than three times a week    Frequency of Social Gatherings with Friends and Family: Never    Attends Religious Services: 1 to 4 times per year    Active Member of [employer] or Organizations: No    Attends Engineer, structural: Not on file    Marital Status: Never married    Family History  Problem Relation Age of Onset   Asthma Mother    Diabetes Father    Hypertension Father    Heart disease Father        afib, died Jan 23, 2020   Mental illness Brother        bipolar, schizophrenia   Anesthesia problems Neg Hx     No Known Allergies  Medications Prior to Admission  Medication Sig Dispense Refill Last Dose/Taking   acetaminophen  (TYLENOL ) 325 MG tablet Take 2 tablets (650 mg total) by mouth every 6 (six) hours as needed.   12/28/2023 Morning   ferrous sulfate  325 (65 FE) MG tablet TAKE 1 TABLET BY MOUTH EVERY DAY 30 tablet 0 12/27/2023   ondansetron  (ZOFRAN -ODT) 4 MG disintegrating tablet Take 1 tablet (4 mg total) by mouth every 6 (six) hours as needed for nausea. 20 tablet 0 12/27/2023   Prenatal Vit-Fe Fumarate-FA (PRENATAL MULTIVITAMIN) TABS tablet Take 1 tablet by mouth daily at 12 noon.   12/28/2023 Morning   aspirin  EC 81 MG tablet Take 2 tablets (162 mg total) by mouth daily. Swallow whole. 60 tablet 12    cyclobenzaprine  (FLEXERIL ) 10 MG tablet Take 1 tablet (10 mg total) by mouth 2 (two) times daily as needed for muscle spasms. (Patient not taking: Reported on 12/22/2023) 20 tablet 0    polyethylene glycol (MIRALAX) 17 g packet Take 17 g by mouth every 3 (three) days. 14 each 0     Review of Systems - Negative except abdominal pain  Vitals:  BP (!) 118/59 (BP Location: Left Arm)   Pulse 90   Temp  98.4 F (36.9 C) (Oral)   Resp 18   LMP 08/14/2023   SpO2 100%  Physical Examination: CONSTITUTIONAL: Well-developed, well-nourished female. + pain and endorses this frequently  HENT:  Normocephalic, atraumatic, External right and left ear normal. Oropharynx is clear and moist EYES: Conjunctivae and EOM are normal. Pupils are equal, round, and reactive to light. No scleral icterus.  NECK: Normal range of motion, supple, no masses  SKIN: Skin is warm and dry. No rash noted. Not diaphoretic. No erythema. No pallor. NEUROLGIC: Alert and oriented to person, place, and time. Normal reflexes, muscle tone coordination. No cranial nerve deficit noted. PSYCHIATRIC: Normal mood and affect. Normal behavior. Normal judgment and thought content. CARDIOVASCULAR: Normal heart rate noted, regular rhythm RESPIRATORY: Effort and breath sounds normal, no problems with respiration noted ABDOMEN: Soft, TTP over uterine fundus. nondistended, gravid. MUSCULOSKELETAL: Normal range of motion. No edema and no tenderness. 2+ distal pulses.  Cervix: Evaluated by sterile speculum exam. and found to be closed and thick appearing on exam. fetal presentation is transverse by US . Copious thick yellow discharge present, specs of blood.  Membranes:ruptured Fetal Monitoring:doppler 150, appears 150s on US  as well.   TAUS showed thick cervix ~3cm.   Labs:  Results for orders placed or performed during the hospital encounter of 12/28/23 (from the past 24 hours)  CBC   Collection Time: 12/28/23 11:06 AM  Result Value Ref Range   WBC 9.6 4.0 - 10.5 K/uL   RBC 4.56 3.87 - 5.11 MIL/uL   Hemoglobin 11.0 (L) 12.0 - 15.0 g/dL   HCT 16.1 (L) 09.6 - 04.5 %   MCV 76.8 (L) 80.0 - 100.0 fL   MCH 24.1 (L) 26.0 - 34.0 pg   MCHC 31.4 30.0 - 36.0 g/dL   RDW 40.9 (H) 81.1 - 91.4 %   Platelets 357 150 - 400 K/uL   nRBC 0.0 0.0 - 0.2 %    Imaging Studies: US  MFM OB LIMITED Result Date:  12/17/2023 ----------------------------------------------------------------------  OBSTETRICS REPORT                        (Signed Final 12/17/2023 04:11 pm) ---------------------------------------------------------------------- Patient Info  ID #:       [id number]                          D.O.B.:  1991/05/18 (33 yrs)(F)  Name:       Gwenette [facility]               Visit Date: 12/17/2023 10:23 am ---------------------------------------------------------------------- Performed By  Attending:        Jodeen Munch      Referred By:       [facility] MAU/Triage                    MD  Performed By:     Lonny Robertson       [facility] ---------------------------------------------------------------------- Orders  #  Description                           Code        Ordered By  1  US  MFM OB LIMITED                     08657.84    [place] ----------------------------------------------------------------------  #  Order #  Accession #                Episode #  1  [id number]                   [id number]                 [id number] ---------------------------------------------------------------------- Indications  Premature rupture of membranes - leaking        O42.90  fluid  Pelvic pain affecting pregnancy in second       O26.892  trimester  Poor obstetric history: Previous preterm        O09.219  delivery, antepartum  [redacted] weeks gestation of pregnancy                 Z3A.17 ---------------------------------------------------------------------- Fetal Evaluation  Num Of Fetuses:          1  Fetal Heart Rate(bpm):   153  Cardiac Activity:        Observed  Presentation:            Breech  Placenta:                Fundal  P. Cord Insertion:       Not well visualized  Amniotic Fluid  AFI FV:      Anhydramnios                              Largest Pocket(cm)                              0.4  ---------------------------------------------------------------------- OB History  Gravidity:    4         Term:   2        Prem:   1        SAB:   0  TOP:          0       Ectopic:  0        Living: 3 ---------------------------------------------------------------------- Gestational Age  LMP:           17w 6d        Date:  08/14/23                   EDD:   05/20/24  Best:          Harlie Lieu 6d     Det. By:  LMP  (08/14/23)          EDD:   05/20/24 ---------------------------------------------------------------------- Cervix Uterus Adnexa  Cervix  Length:            3.5  cm.  Normal appearance by transabdominal scan  Adnexa  No abnormality visualized ---------------------------------------------------------------------- Impression  Limited exam due to suspected PPROM  Anhydramnios noted  Breech presentation ---------------------------------------------------------------------- Recommendations  Clinical correlation recommended. 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   PPROM at 17/6 (December 17, 2023) 12/17/2023   Anhydramnios 12/17/2023   Alpha thalassemia silent carrier 12/17/2023   Trichomonal vaginitis in pregnancy in first trimester 12/17/2023   Chlamydia trachomatis infection in pregnancy 12/17/2023  History of preterm delivery 11/24/2023   History of postpartum gestational hypertension 11/24/2023   Supervision of high risk pregnancy, antepartum 10/26/2023   Admit to Antenatal Routine antenatal care  #PPPROM - No BMZ until [redacted]w[redacted]d - Possible chorio vs abruption vs labor given presentation - CBC reassuring - Daily CBC - TAUS in MAU  reassuring, oligohydramnios. Thick cervix.   #Chlamydia-- known and positive on 6/6. Treated   Abner Ables, MD, MPH, ABFM Attending Physician Faculty Practice- [facility]

## 2023-12-28 NOTE — MAU Note (Signed)
 [name] is a 33 y.o. at [redacted]w[redacted]d here in MAU reporting: EMS arrival with c/o severe headache that started yesterday after she was discharged. Took tylenol  without relief. Having nausea and has vomited x3 this morning. Pt has know PPROM at 17weeks. Discharge from [facility] yesterday.  Reports some abd cramping that justa started  a few minutes ago.  LMP:  Onset of complaint: yesterday Pain score: 10 Vitals:   12/28/23 0944  BP: (!) 118/59  Pulse: 90  Resp: 18  Temp: 98.4 F (36.9 C)  SpO2: 100%     FHT: 150  Lab orders placed from triage:

## 2023-12-28 NOTE — MAU Provider Note (Signed)
 History     CSN: [id number]  Arrival date and time: 12/28/23 4098   Event Date/Time   First Provider Initiated Contact with Patient 12/28/23 1038      Chief Complaint  Patient presents with   Headache   [name] is a 33 y.o. [id number] with known previable PPROM at 17 wk currently [redacted]w[redacted]d presenting mostly for HA. Reports she was sent home yesterday from the hospital. Noted a HA in the evening. She took tylenol  and feel asleep. She reports she woke up at 4 AM and the HA and took another dose of tylenol . She reports associated nausea and vomiting that started today with inability to keep down food and water this AM. She did not try taking her zofran . She is reports also new onset abdominal pain that started when she arrived. Abdominal pain is located over there uterus. TTP on this area. Denies fever, chills.   Her HA pain is 10/10 Her abdominal pain is 6/10  FHR was WNL   Headache  This is a new problem. The current episode started yesterday. The problem occurs constantly. The problem has been unchanged. The pain is located in the Bilateral region. The pain does not radiate. The pain quality is not similar to prior headaches. The quality of the pain is described as aching. The pain is at a severity of 10/10. The pain is moderate. Associated symptoms include abdominal pain, nausea and vomiting. Pertinent negatives include no back pain, coughing, dizziness, eye pain, fever or sore throat. The symptoms are aggravated by bright light. She has tried acetaminophen  for the symptoms. The treatment provided mild relief.    OB History     Gravida  4   Para  3   Term  2   Preterm  1   AB  0   Living  3      SAB  0   IAB  0   Ectopic  0   Multiple  0   Live Births  3           Past Medical History:  Diagnosis Date   Abdominal trauma 10/21/2011   Anemia    Blood transfusion    01/18/09 s/p SVD   Chronic back pain    Dental caries 10/22/2011   Dental referral  IMO SNOMED Dx  Update Oct 2024     Depression    not on meds   Encounter for Depo-Provera  contraception 06/15/2012   GERD (gastroesophageal reflux disease)    High-risk pregnancy supervision 10/22/2011   History of preterm delivery, currently pregnant 10/22/2011   Premature dilation and effacement, Rx Prometrium       Mental disorder    NSVD (normal spontaneous vaginal delivery) 12/11/2011   Pelvic pain in female 06/15/2012   Rt aadnexal tenderness ? Thickening> pelvic US      Pregnancy induced hypertension 07/13/2008   required Mag Sulfate   Preterm labor    first pregnancy/current pregnancy   UTI (urinary tract infection) in pregnancy, antepartum    09/2011 tx with abx    Past Surgical History:  Procedure Laterality Date   FINGER SURGERY     right thumb in childhood    Family History  Problem Relation Age of Onset   Asthma Mother    Diabetes Father    Hypertension Father    Heart disease Father        afib, died 01-19-20   Mental illness Brother        bipolar, schizophrenia   Anesthesia  problems Neg Hx     Social History   Tobacco Use   Smoking status: Some Days    Types: Cigarettes   Smokeless tobacco: Never  Vaping Use   Vaping status: Never Used  Substance Use Topics   Alcohol use: Not Currently    Comment: occ   Drug use: Not Currently    Allergies: No Known Allergies  Medications Prior to Admission  Medication Sig Dispense Refill Last Dose/Taking   acetaminophen  (TYLENOL ) 325 MG tablet Take 2 tablets (650 mg total) by mouth every 6 (six) hours as needed.   12/28/2023 Morning   ferrous sulfate  325 (65 FE) MG tablet TAKE 1 TABLET BY MOUTH EVERY DAY 30 tablet 0 12/27/2023   ondansetron  (ZOFRAN -ODT) 4 MG disintegrating tablet Take 1 tablet (4 mg total) by mouth every 6 (six) hours as needed for nausea. 20 tablet 0 12/27/2023   Prenatal Vit-Fe Fumarate-FA (PRENATAL MULTIVITAMIN) TABS tablet Take 1 tablet by mouth daily at 12 noon.   12/28/2023 Morning   aspirin  EC 81 MG tablet  Take 2 tablets (162 mg total) by mouth daily. Swallow whole. 60 tablet 12    cyclobenzaprine  (FLEXERIL ) 10 MG tablet Take 1 tablet (10 mg total) by mouth 2 (two) times daily as needed for muscle spasms. (Patient not taking: Reported on 12/22/2023) 20 tablet 0    polyethylene glycol (MIRALAX) 17 g packet Take 17 g by mouth every 3 (three) days. 14 each 0     Review of Systems  Constitutional:  Negative for chills and fever.  HENT:  Negative for congestion and sore throat.   Eyes:  Negative for pain and visual disturbance.  Respiratory:  Negative for cough, chest tightness and shortness of breath.   Cardiovascular:  Negative for chest pain.  Gastrointestinal:  Positive for abdominal pain, nausea and vomiting. Negative for diarrhea.  Endocrine: Negative for cold intolerance and heat intolerance.  Genitourinary:  Negative for dysuria and flank pain.  Musculoskeletal:  Negative for back pain.  Skin:  Negative for rash.  Allergic/Immunologic: Negative for food allergies.  Neurological:  Positive for headaches. Negative for dizziness and light-headedness.  Psychiatric/Behavioral:  Negative for agitation.    Physical Exam   Blood pressure (!) 118/59, pulse 90, temperature 98.4 F (36.9 C), temperature source Oral, resp. rate 18, last menstrual period 08/14/2023, SpO2 100%.  Physical Exam Vitals and nursing note reviewed.  Constitutional:      General: She is not in acute distress.    Appearance: She is well-developed.     Comments: Pregnant female  HENT:     Head: Normocephalic and atraumatic.   Eyes:     General: No scleral icterus.    Conjunctiva/sclera: Conjunctivae normal.    Cardiovascular:     Rate and Rhythm: Normal rate.  Pulmonary:     Effort: Pulmonary effort is normal.  Chest:     Chest wall: No tenderness.  Abdominal:     Palpations: Abdomen is soft.     Tenderness: There is abdominal tenderness (TTP over the fundus of uterus). There is no guarding or rebound.      Comments: Gravid  Genitourinary:    Vagina: Normal.   Musculoskeletal:        General: Normal range of motion.     Cervical back: Normal range of motion and neck supple.   Skin:    General: Skin is warm and dry.     Findings: No rash.   Neurological:     Mental Status: She  is alert and oriented to person, place, and time.     GCS: GCS eye subscore is 4. GCS verbal subscore is 5. GCS motor subscore is 6.     MAU Course  Procedures  MDM- high  PPPROM with abdominal pain, must rule out chorio vs abruption HA -- concerning in the setting of PPPROM as a potential systemic infection sx. Will trial OTC treatment and escalate PRN to IV meds. DDX given this is her worst HA would be intracranial bleed or pathology but this is less likely with a benign neuro exam on initial assessment.   - CBC  12:42 PM reassessment. HA resolved. Patient is writhing in pain-- all abdominal.  Speculum exam, cervix appears closed. US  abdominally cervix appears closed.  TTP over uterine fundus.   [id number] [name] March 29, 1991  Patient informed that the ultrasound is considered a limited OB ultrasound and is not intended to be a complete ultrasound exam.  Patient also informed that the ultrasound is not being completed with the intent of assessing for fetal or placental anomalies or any pelvic abnormalities.  Explained that the purpose of today's ultrasound is to assess for  Cervical assessment.  Closed appearing. + fetal movement. + amniotic fluid, subjectively greatly increased < 2cm MVP. Patient acknowledges the purpose of the exam and the limitations of the study.  Admit to [facility] for concern for chorio in the setting of PPPROM   Abner Ables, MD 12/28/2023, 12:44 PM    Assessment and Plan   PPPROM  Marine Sia [facility] 12/28/2023, 10:38 AM

## 2023-12-28 NOTE — H&P (Addendum)
 Signed     Expand All Collapse All[] Expand All by Default    History    CSN: [id number]   Arrival date and time: 12/28/23 4098    Event Date/Time   First Provider Initiated Contact with Patient 12/28/23 1038          Chief Complaint  Patient presents with   Headache    [name] is a 33 y.o. [id number] with known previable PPROM at 17 wk currently [redacted]w[redacted]d presenting mostly for HA. Reports she was sent home yesterday from the hospital. Noted a HA in the evening. She took tylenol  and feel asleep. She reports she woke up at 4 AM and the HA and took another dose of tylenol . She reports associated nausea and vomiting that started today with inability to keep down food and water this AM. She did not try taking her zofran . She is reports also new onset abdominal pain that started when she arrived. Abdominal pain is located over there uterus. TTP on this area. Denies fever, chills.    Her HA pain is 10/10 Her abdominal pain is 6/10   FHR was WNL    Headache  This is a new problem. The current episode started yesterday. The problem occurs constantly. The problem has been unchanged. The pain is located in the Bilateral region. The pain does not radiate. The pain quality is not similar to prior headaches. The quality of the pain is described as aching. The pain is at a severity of 10/10. The pain is moderate. Associated symptoms include abdominal pain, nausea and vomiting. Pertinent negatives include no back pain, coughing, dizziness, eye pain, fever or sore throat. The symptoms are aggravated by bright light. She has tried acetaminophen  for the symptoms. The treatment provided mild relief.      OB History       Gravida  4   Para  3   Term  2   Preterm  1   AB  0   Living  3        SAB  0   IAB  0   Ectopic  0   Multiple  0   Live Births  3                   Past Medical History:  Diagnosis Date   Abdominal trauma 10/21/2011   Anemia     Blood transfusion      2010 s/p  SVD   Chronic back pain     Dental caries 10/22/2011    Dental referral  IMO SNOMED Dx Update Oct 2024     Depression      not on meds   Encounter for Depo-Provera  contraception 06/15/2012   GERD (gastroesophageal reflux disease)     High-risk pregnancy supervision 10/22/2011   History of preterm delivery, currently pregnant 10/22/2011    Premature dilation and effacement, Rx Prometrium       Mental disorder     NSVD (normal spontaneous vaginal delivery) 12/11/2011   Pelvic pain in female 06/15/2012    Rt aadnexal tenderness ? Thickening> pelvic US      Pregnancy induced hypertension 07/13/2008    required Mag Sulfate   Preterm labor      first pregnancy/current pregnancy   UTI (urinary tract infection) in pregnancy, antepartum      09/2011 tx with abx               Past Surgical History:  Procedure Laterality Date   FINGER SURGERY  right thumb in childhood               Family History  Problem Relation Age of Onset   Asthma Mother     Diabetes Father     Hypertension Father     Heart disease Father          afib, died January 24, 2020   Mental illness Brother          bipolar, schizophrenia   Anesthesia problems Neg Hx            Social History  Social History         Tobacco Use   Smoking status: Some Days      Types: Cigarettes   Smokeless tobacco: Never  Vaping Use   Vaping status: Never Used  Substance Use Topics   Alcohol use: Not Currently      Comment: occ   Drug use: Not Currently        Allergies:  Allergies  No Known Allergies            Medications Prior to Admission  Medication Sig Dispense Refill Last Dose/Taking   acetaminophen  (TYLENOL ) 325 MG tablet Take 2 tablets (650 mg total) by mouth every 6 (six) hours as needed.     12/28/2023 Morning   ferrous sulfate  325 (65 FE) MG tablet TAKE 1 TABLET BY MOUTH EVERY DAY 30 tablet 0 12/27/2023   ondansetron  (ZOFRAN -ODT) 4 MG disintegrating tablet Take 1 tablet (4 mg total) by mouth every 6  (six) hours as needed for nausea. 20 tablet 0 12/27/2023   Prenatal Vit-Fe Fumarate-FA (PRENATAL MULTIVITAMIN) TABS tablet Take 1 tablet by mouth daily at 12 noon.     12/28/2023 Morning   aspirin  EC 81 MG tablet Take 2 tablets (162 mg total) by mouth daily. Swallow whole. 60 tablet 12     cyclobenzaprine  (FLEXERIL ) 10 MG tablet Take 1 tablet (10 mg total) by mouth 2 (two) times daily as needed for muscle spasms. (Patient not taking: Reported on 12/22/2023) 20 tablet 0     polyethylene glycol (MIRALAX) 17 g packet Take 17 g by mouth every 3 (three) days. 14 each 0            Review of Systems  Constitutional:  Negative for chills and fever.  HENT:  Negative for congestion and sore throat.   Eyes:  Negative for pain and visual disturbance.  Respiratory:  Negative for cough, chest tightness and shortness of breath.   Cardiovascular:  Negative for chest pain.  Gastrointestinal:  Positive for abdominal pain, nausea and vomiting. Negative for diarrhea.  Endocrine: Negative for cold intolerance and heat intolerance.  Genitourinary:  Negative for dysuria and flank pain.  Musculoskeletal:  Negative for back pain.  Skin:  Negative for rash.  Allergic/Immunologic: Negative for food allergies.  Neurological:  Positive for headaches. Negative for dizziness and light-headedness.  Psychiatric/Behavioral:  Negative for agitation.     Physical Exam    Blood pressure (!) 118/59, pulse 90, temperature 98.4 F (36.9 C), temperature source Oral, resp. rate 18, last menstrual period 08/14/2023, SpO2 100%.   Physical Exam Vitals and nursing note reviewed.  Constitutional:      General: She is not in acute distress.    Appearance: She is well-developed.     Comments: Pregnant female  HENT:     Head: Normocephalic and atraumatic.    Eyes:     General: No scleral icterus.    Conjunctiva/sclera: Conjunctivae normal.  Cardiovascular:     Rate and Rhythm: Normal rate.  Pulmonary:     Effort:  Pulmonary effort is normal.  Chest:     Chest wall: No tenderness.  Abdominal:     Palpations: Abdomen is soft.     Tenderness: There is abdominal tenderness (TTP over the fundus of uterus). There is no guarding or rebound.     Comments: Gravid  Genitourinary:    Vagina: Normal.    Musculoskeletal:        General: Normal range of motion.     Cervical back: Normal range of motion and neck supple.    Skin:    General: Skin is warm and dry.     Findings: No rash.    Neurological:     Mental Status: She is alert and oriented to person, place, and time.     GCS: GCS eye subscore is 4. GCS verbal subscore is 5. GCS motor subscore is 6.        MAU Course  Procedures   MDM- high   PPPROM with abdominal pain, must rule out chorio vs abruption HA -- concerning in the setting of PPPROM as a potential systemic infection sx. Will trial OTC treatment and escalate PRN to IV meds. DDX given this is her worst HA would be intracranial bleed or pathology but this is less likely with a benign neuro exam on initial assessment.    - CBC   12:42 PM reassessment. HA resolved. Patient is writhing in pain-- all abdominal.  Speculum exam, cervix appears closed. US  abdominally cervix appears closed.  TTP over uterine fundus.    [id number] [name] [birth year]   Patient informed that the ultrasound is considered a limited OB ultrasound and is not intended to be a complete ultrasound exam.  Patient also informed that the ultrasound is not being completed with the intent of assessing for fetal or placental anomalies or any pelvic abnormalities.  Explained that the purpose of today's ultrasound is to assess for  Cervical assessment.  Closed appearing. + fetal movement. + amniotic fluid, subjectively greatly increased < 2cm MVP. Patient acknowledges the purpose of the exam and the limitations of the study.   Admit to [facility] for concern for chorio in the setting of PPPROM    Abner Ables,  MD 12/28/2023, 12:44 PM       Assessment and Plan    PPPROM   Marine Sia [facility] 12/28/2023, 10:38 AM          Electronically signed by Abner Ables, MD at 12/28/2023 12:47 PM   Patient with abdominal pain that started in the MAU. Initial presenting s/s was coming in via EMS for HA. Firm fundus below the umbilicus, nttp, abdominal non distended.  No overt s/s of infection or PTL. Admit to antepartum, rpt CBC in AM. UCx neg 4wks ago; will repeat  Patient states she last had a BM 2 days ago which she states is good for her as she usually goes once a week. Will do an increased BM regimen  Tyler Gallant MD Attending Center for [facility]) GYN Consult Phone: [phone] (M-F, [phone]) & [phone] (Off hours, weekends, holidays)

## 2023-12-29 LAB — CBC
HCT: 34.1 % — ABNORMAL LOW (ref 36.0–46.0)
Hemoglobin: 10.7 g/dL — ABNORMAL LOW (ref 12.0–15.0)
MCH: 24.1 pg — ABNORMAL LOW (ref 26.0–34.0)
MCHC: 31.4 g/dL (ref 30.0–36.0)
MCV: 76.8 fL — ABNORMAL LOW (ref 80.0–100.0)
Platelets: 360 10*3/uL (ref 150–400)
RBC: 4.44 MIL/uL (ref 3.87–5.11)
RDW: 21.4 % — ABNORMAL HIGH (ref 11.5–15.5)
WBC: 7.7 10*3/uL (ref 4.0–10.5)
nRBC: 0 % (ref 0.0–0.2)

## 2023-12-29 LAB — BPAM RBC: Unit Type and Rh: 5100

## 2023-12-29 LAB — TYPE AND SCREEN

## 2023-12-29 MED ORDER — DIPHENHYDRAMINE HCL 25 MG PO CAPS
25.0000 mg | ORAL_CAPSULE | Freq: Three times a day (TID) | ORAL | Status: DC | PRN
  Administered 2023-12-29: 25 mg via ORAL
  Filled 2023-12-29 (×2): qty 1

## 2023-12-29 MED ORDER — MORPHINE SULFATE (PF) 2 MG/ML IV SOLN
2.0000 mg | Freq: Once | INTRAVENOUS | Status: AC
  Administered 2023-12-29: 2 mg via INTRAVENOUS
  Filled 2023-12-29: qty 1

## 2023-12-29 MED ORDER — ONDANSETRON 4 MG PO TBDP
4.0000 mg | ORAL_TABLET | Freq: Four times a day (QID) | ORAL | Status: DC | PRN
  Administered 2023-12-29 – 2023-12-31 (×3): 4 mg via ORAL
  Filled 2023-12-29 (×3): qty 1

## 2023-12-29 MED ORDER — METOCLOPRAMIDE HCL 10 MG PO TABS
10.0000 mg | ORAL_TABLET | Freq: Three times a day (TID) | ORAL | Status: DC | PRN
  Administered 2023-12-29 – 2023-12-31 (×2): 10 mg via ORAL
  Filled 2023-12-29 (×3): qty 1

## 2023-12-29 NOTE — Progress Notes (Signed)
 Patient ID: [name], female   DOB: 02-16-1991, 33 y.o.   MRN: [id number] FACULTY PRACTICE ANTEPARTUM(COMPREHENSIVE) NOTE  [name] is a 33 y.o. [phone] at 19wd  who is admitted for known previable PPROM at 17 wk currently [redacted]w[redacted]d presenting mostly for HA. She has had some spotting and pain.   Fetal presentation is breech. Length of Stay:  1  Days  Subjective: Spotting, pain improving Patient reports the fetal movement as active. Patient reports uterine contraction  activity as none. Patient reports  vaginal bleeding as scant staining. Patient describes fluid per vagina as Clear.  Vitals:  Blood pressure 120/78, pulse (!) 115, temperature 98.8 F (37.1 C), temperature source Oral, resp. rate 20, last menstrual period 08/14/2023, SpO2 100%. Physical Examination:  General appearance - alert, well appearing, and in no distress Heart - normal rate and regular rhythm Abdomen - soft, nontender, nondistended Fundal Height:  size equals dates Cervical Exam: Not evaluated. Extremities: extremities normal, atraumatic, no cyanosis or edema and Homans sign is negative, no sign of DVT  Membranes:ruptured  Fetal Monitoring:   Fetal Heart Rate A   Mode Doppler filed at 12/29/2023 0757  Baseline Rate (A) 154 bpm filed at 12/29/2023 0757    Labs:  Results for orders placed or performed during the hospital encounter of 12/28/23 (from the past 24 hours)  CBC   Collection Time: 12/28/23 11:06 AM  Result Value Ref Range   WBC 9.6 4.0 - 10.5 K/uL   RBC 4.56 3.87 - 5.11 MIL/uL   Hemoglobin 11.0 (L) 12.0 - 15.0 g/dL   HCT 41.3 (L) 24.4 - 01.0 %   MCV 76.8 (L) 80.0 - 100.0 fL   MCH 24.1 (L) 26.0 - 34.0 pg   MCHC 31.4 30.0 - 36.0 g/dL   RDW 27.2 (H) 53.6 - 64.4 %   Platelets 357 150 - 400 K/uL   nRBC 0.0 0.0 - 0.2 %  Type and screen [facility]   Collection Time: 12/28/23  1:14 PM  Result Value Ref Range   ABO/RH(D) O POS    Antibody Screen POS    Sample  Expiration 12/31/2023,2359    Antibody Identification ANTI LEA Harles Lied a) ANTI LEB Harles Lied b)    Unit Number [id number]    Blood Component Type RED CELLS,LR    Unit division 00    Status of Unit ALLOCATED    Transfusion Status OK TO TRANSFUSE    Crossmatch Result      COMPATIBLE Performed at [facility], [address] [place], [place] [address]    Unit Number [id number]    Blood Component Type RED CELLS,LR    Unit division 00    Status of Unit ALLOCATED    Transfusion Status OK TO TRANSFUSE    Crossmatch Result COMPATIBLE   BPAM RBC   Collection Time: 12/28/23  1:14 PM  Result Value Ref Range   Blood Product Unit Number [id number]    PRODUCT CODE E0382V00    Unit Type and Rh 5100    Blood Product Expiration Date 322025427062    Blood Product Unit Number [id number]    PRODUCT CODE E0382V00    Unit Type and Rh 5100    Blood Product Expiration Date 073710626948   CBC   Collection Time: 12/29/23  6:29 AM  Result Value Ref Range   WBC 7.7 4.0 - 10.5 K/uL   RBC 4.44 3.87 - 5.11 MIL/uL   Hemoglobin 10.7 (L) 12.0 - 15.0 g/dL  HCT 34.1 (L) 36.0 - 46.0 %   MCV 76.8 (L) 80.0 - 100.0 fL   MCH 24.1 (L) 26.0 - 34.0 pg   MCHC 31.4 30.0 - 36.0 g/dL   RDW 13.0 (H) 86.5 - 78.4 %   Platelets 360 150 - 400 K/uL   nRBC 0.0 0.0 - 0.2 %     Medications:  Scheduled  bisacodyl  5 mg Oral Daily    morphine  injection  2 mg Intravenous Once   polyethylene glycol  17 g Oral QODAY   prenatal multivitamin  1 tablet Oral Q1200   sodium chloride  flush  3-10 mL Intravenous Q12H   I have reviewed the patient's current medications.  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

## 2023-12-30 ENCOUNTER — Other Ambulatory Visit: Payer: Self-pay

## 2023-12-30 LAB — URINE CULTURE: Culture: >=100000 — AB

## 2023-12-30 MED ORDER — OXYCODONE HCL 5 MG PO TABS
5.0000 mg | ORAL_TABLET | Freq: Once | ORAL | Status: AC | PRN
  Administered 2023-12-30: 5 mg via ORAL
  Filled 2023-12-30: qty 2

## 2023-12-30 MED ORDER — METOCLOPRAMIDE HCL 5 MG/ML IJ SOLN
10.0000 mg | Freq: Once | INTRAMUSCULAR | Status: AC
  Administered 2023-12-30: 10 mg via INTRAVENOUS
  Filled 2023-12-30: qty 2

## 2023-12-30 MED ORDER — AMOXICILLIN 500 MG PO CAPS
500.0000 mg | ORAL_CAPSULE | Freq: Three times a day (TID) | ORAL | Status: DC
  Administered 2023-12-30 – 2023-12-31 (×3): 500 mg via ORAL
  Filled 2023-12-30 (×3): qty 1

## 2023-12-30 MED ORDER — DIPHENHYDRAMINE HCL 50 MG/ML IJ SOLN
25.0000 mg | Freq: Once | INTRAMUSCULAR | Status: AC
  Administered 2023-12-30: 25 mg via INTRAVENOUS
  Filled 2023-12-30: qty 1

## 2023-12-30 NOTE — Progress Notes (Signed)
 FACULTY PRACTICE ANTEPARTUM COMPREHENSIVE PROGRESS NOTE  [name] is a 33 y.o. [phone] at [redacted]w[redacted]d who is admitted for previable PPROM at 17 weeks.  Estimated Date of Delivery: 05/20/24 Fetal presentation is breech.  Length of Stay:  2 Days. Admitted 12/28/2023  Subjective: Reports spotting and abdominal pain that comes/goes like cramping.    Vitals:  Blood pressure (!) 134/94, pulse (!) 106, temperature 98.8 F (37.1 C), temperature source Oral, resp. rate 20, last menstrual period 08/14/2023, SpO2 99%. Physical Examination: CONSTITUTIONAL: Well-developed, well-nourished female in no acute distress.  NEUROLOGIC: Alert and oriented to person, place, and time. No cranial nerve deficit noted. PSYCHIATRIC: Normal mood and affect. Normal behavior. Normal judgment and thought content. CARDIOVASCULAR: regular rate RESPIRATORY: nonlabored breathing MUSCULOSKELETAL: Normal range of motion. No edema and no tenderness. 2+ distal pulses. ABDOMEN: Soft, nontender, nondistended, gravid. PELVIC:  - EGBUS within normal limits - Vagina normal without bleeding, small discharge noted - Cervix appears closed   Doptones: 142 bpm  Results for orders placed or performed during the hospital encounter of 12/28/23 (from the past 48 hours)  CBC     Status: Abnormal   Collection Time: 12/28/23 11:06 AM  Result Value Ref Range   WBC 9.6 4.0 - 10.5 K/uL   RBC 4.56 3.87 - 5.11 MIL/uL   Hemoglobin 11.0 (L) 12.0 - 15.0 g/dL   HCT 45.4 (L) 09.8 - 11.9 %   MCV 76.8 (L) 80.0 - 100.0 fL   MCH 24.1 (L) 26.0 - 34.0 pg   MCHC 31.4 30.0 - 36.0 g/dL   RDW 14.7 (H) 82.9 - 56.2 %   Platelets 357 150 - 400 K/uL   nRBC 0.0 0.0 - 0.2 %    Comment: Performed at [facility], [address], [place], [place] [address]  Urine Culture     Status: Abnormal (Preliminary result)   Collection Time: 12/28/23 12:53 PM   Specimen: Urine, Clean Catch  Result Value Ref Range   Specimen Description URINE, CLEAN CATCH     Special Requests NONE    Culture (A)     >=100,000 COLONIES/mL DIPHTHEROIDS(CORYNEBACTERIUM SPECIES) Standardized susceptibility testing for this organism is not available. CULTURE REINCUBATED FOR BETTER GROWTH Performed at [facility], [address], [place], [place] [address]    Report Status PENDING   Type and screen [facility]     Status: None (Preliminary result)   Collection Time: 12/28/23  1:14 PM  Result Value Ref Range   ABO/RH(D) O POS    Antibody Screen POS    Sample Expiration 12/31/2023,2359    Antibody Identification ANTI LEA Harles Lied a) ANTI LEB Harles Lied b)    Unit Number [id number]    Blood Component Type RED CELLS,LR    Unit division 00    Status of Unit ALLOCATED    Transfusion Status OK TO TRANSFUSE    Crossmatch Result COMPATIBLE    Unit Number [id number]    Blood Component Type RED CELLS,LR    Unit division 00    Status of Unit ALLOCATED    Transfusion Status OK TO TRANSFUSE    Crossmatch Result COMPATIBLE   CBC     Status: Abnormal   Collection Time: 12/29/23  6:29 AM  Result Value Ref Range   WBC 7.7 4.0 - 10.5 K/uL   RBC 4.44 3.87 - 5.11 MIL/uL   Hemoglobin 10.7 (L) 12.0 - 15.0 g/dL   HCT 34.7 (L) 42.5 - 95.6 %   MCV  76.8 (L) 80.0 - 100.0 fL   MCH 24.1 (L) 26.0 - 34.0 pg   MCHC 31.4 30.0 - 36.0 g/dL   RDW 16.1 (H) 09.6 - 04.5 %   Platelets 360 150 - 400 K/uL   nRBC 0.0 0.0 - 0.2 %    Comment: Performed at [facility], [address] [place], [place] [address]    No results found.  Current scheduled medications  polyethylene glycol  17 g Oral QODAY   prenatal multivitamin  1 tablet Oral Q1200   sodium chloride  flush  3-10 mL Intravenous Q12H    I have reviewed the patient's current medications.  ASSESSMENT: Principal Problem:   Abdominal pain affecting pregnancy Active Problems:   PPROM at 17/6 (December 17, 2023)   Constipation during pregnancy in second trimester   PLAN: Reviewed goals of care  with patient and options including continued pregnancy vs induction. She reiterates her desire for continued pregnancy with hopes of reaching viability Reviewed outpatient management which she is not comfortable with at this time Discussed NICU consult, which she is interested in Reassess for discharge tomorrow   Continue routine antenatal care.   Marci Setter, MD, FACOG Obstetrician & Gynecologist, [facility] for [facility], [facility]

## 2023-12-30 NOTE — Consult Note (Signed)
 Neonatology Consult - Antenatal Patient  Reason for Consult: At the request of Dr. Alto Atta, I evaluated [name] to provide antenatal counseling in the setting of a threatened preterm delivery.  History and Clinical Summary: [name] was admitted on 12/28/2023 at 32 weeks and 3 days gestational age ([place]) for evaluation of persistent headaches. She is currently previable and has experienced preterm premature rupture of membranes (PPROM) since 32 weeks and 6 days GA. There are no current signs of active labor or clinical concern for chorioamnionitis. Oligohydramnios is present, and the patient reports ongoing intermittent fluid leakage. The fetus is in breech presentation.  The patient desires to continue the pregnancy and does not wish to pursue withdrawal of support at this time. She has questions regarding the potential benefits of prolonging the pregnancy and the appropriate timing for administration of antenatal corticosteroids. She has a 18 year old child who was previously admitted to our NICU following preterm labor at approximately [redacted] weeks GA.  Counseling Discussion: I met with [name] at bedside. Another individual was present, resting nearby. I acknowledged the seriousness of her situation and discussed the potential outcomes.  I explained that there is a significant risk of intrauterine fetal demise. Should the pregnancy progress towards [redacted] weeks GA, administration of antenatal steroids would be reasonable. However, the persistent oligohydramnios is highly likely to result in severe pulmonary hypoplasia, which would most likely be evident at delivery. If the infant survives delivery, the early neonatal course would be expected to be extremely critical and additional difficult conversations will have to be had.  Unfortunately, there are no in utero tests or imaging modalities that can reliably predict the degree of pulmonary hypoplasia or overall prognosis. It is reasonable for the  patient to continue the pregnancy in hopes of a trial of life. However, I advised that in the event of delivery, resuscitative efforts such as epinephrine administration or chest compressions may not be appropriate given the anticipated severity of lung underdevelopment.  [name] expressed understanding and appreciation. She acknowledged the difficulty of the decisions ahead and became visibly tearful during the discussion. Condolences were offered, and emotional support was provided.    At this time, [name] expressed her intention to speak with the baby's father, who resides in [place] and is unable to visit this evening due to work Psychologist, educational. She reaffirmed her desire to continue the pregnancy at this time and stated that she also plans to follow up with her OB team to discuss future steps.  I offered to return for further discussion or questions as needed.  Thank you for the opportunity to be involved in the care of this patient.  Elonda Hale Cheryn Coss, MD Neonatologist 12/30/2023, 11:11 PM  The total length of face-to-face or floor/unit time for this encounter was 25 minutes. Counseling and/or coordination of care was 40 minutes of the above.

## 2023-12-31 ENCOUNTER — Other Ambulatory Visit (HOSPITAL_COMMUNITY): Payer: Self-pay

## 2023-12-31 MED ORDER — PRENATAL MULTIVITAMIN CH
1.0000 | ORAL_TABLET | Freq: Every day | ORAL | 0 refills | Status: AC
  Filled 2023-12-31: qty 30, 30d supply, fill #0

## 2023-12-31 MED ORDER — ONDANSETRON 4 MG PO TBDP
4.0000 mg | ORAL_TABLET | Freq: Four times a day (QID) | ORAL | 0 refills | Status: DC | PRN
  Filled 2023-12-31: qty 18, 5d supply, fill #0

## 2023-12-31 MED ORDER — AMOXICILLIN 500 MG PO CAPS
500.0000 mg | ORAL_CAPSULE | Freq: Three times a day (TID) | ORAL | 0 refills | Status: DC
  Filled 2023-12-31: qty 15, 5d supply, fill #0

## 2023-12-31 MED ORDER — AMOXICILLIN 500 MG PO CAPS
500.0000 mg | ORAL_CAPSULE | Freq: Three times a day (TID) | ORAL | 0 refills | Status: AC
  Filled 2023-12-31: qty 18, 6d supply, fill #0

## 2023-12-31 NOTE — Discharge Summary (Signed)
 Physician Discharge Summary  Patient ID: [name] MRN: [id number] DOB/AGE: September 06, 1990 33 y.o.  Admit date: 12/28/2023 Discharge date: 12/31/2023  Admission Diagnoses: Abdominal pain in pregnancy -PPROM diagnosed @ [redacted]w[redacted]d -Constipation  Discharge Diagnoses:  Principal Problem:   Abdominal pain affecting pregnancy Active Problems:   PPROM at 17/6 (December 17, 2023)   Constipation during pregnancy in second trimester   Discharged Condition: stable  Hospital Course: 33 year old P2.  Placement.  Viable PPROM 17 weeks admitted @ [redacted]w[redacted]d due to headache, nausea vomiting, constipation and abdominal pain.  She was treated with Tylenol , Zofran , Reglan  and MiraLAX as needed.  She showed no signs, labor or abruption.  During her admission, she was also seen by NICU for consultation.  She was discharged home on HD #2 with plans for close outpatient follow-up  Consults: None  Significant Diagnostic Studies: labs: No results found for this or any previous visit (from the past 24 hours).   Treatments: analgesia: acetaminophen   Discharge Exam: Blood pressure 114/71, pulse 100, temperature (!) 97.5 F (36.4 C), temperature source Oral, resp. rate 17, height 5' 7 (1.702 m), weight 78.6 kg, last menstrual period 08/14/2023, SpO2 100%. General appearance: alert, cooperative, and no distress Resp: clear to auscultation bilaterally Cardio: regular rate and rhythm GI: gravid, soft and non-tender Extremities: no edema Skin: warm and dry Neurologic: Grossly normal  Disposition: Discharge disposition: 01-Home or Self Care        Allergies as of 12/31/2023   No Known Allergies      Medication List     TAKE these medications    acetaminophen  325 MG tablet Commonly known as: Tylenol  Take 2 tablets (650 mg total) by mouth every 6 (six) hours as needed.   amoxicillin 500 MG capsule Commonly known as: AMOXIL Take 1 capsule (500 mg total) by mouth every 8 (eight) hours for 5 days.    aspirin  EC 81 MG tablet Take 2 tablets (162 mg total) by mouth daily. Swallow whole.   cyclobenzaprine  10 MG tablet Commonly known as: FLEXERIL  Take 1 tablet (10 mg total) by mouth 2 (two) times daily as needed for muscle spasms.   ferrous sulfate  325 (65 FE) MG tablet TAKE 1 TABLET BY MOUTH EVERY DAY   ondansetron  4 MG disintegrating tablet Commonly known as: ZOFRAN -ODT Take 1 tablet (4 mg total) by mouth every 6 (six) hours as needed for nausea.   polyethylene glycol 17 g packet Commonly known as: MiraLax Take 17 g by mouth every 3 (three) days.   prenatal multivitamin Tabs tablet Take 1 tablet by mouth daily at 12 noon.        Follow-up Information     Center for [facility] Follow up.   Specialty: Obstetrics and Gynecology Why: Weekly as scheduled Contact information: [address] [place]  [phone]                Signed: Christel Cousins 12/31/2023, 6:37 AM

## 2023-12-31 NOTE — Social Work (Signed)
 RN contacted CSW regarding the patient needing transportation home. CSW followed up with the patient at bedside and confirmed her discharge address. The patient completed a Veterinary surgeon Form and CSW placed in patient's paper chart.   CSW provided RN with instruction to call [facility] when the patient is ready to discharge.   Nickolas Barr, MSW, LCSW Clinical Social Worker  [phone] 12/31/2023  9:01 AM

## 2023-12-31 NOTE — Progress Notes (Signed)
 Reviewed with pharmacy +Ucx consistent with UTI. Amoxicillin recommended.   Marci Setter, MD, FACOG Obstetrician & Gynecologist, [facility] for [facility], [facility]

## 2024-01-01 LAB — BPAM RBC
Blood Product Expiration Date: 202507142359
Blood Product Expiration Date: 202507162359
Unit Type and Rh: 5100

## 2024-01-01 LAB — TYPE AND SCREEN
ABO/RH(D): O POS
Antibody Screen: POSITIVE
Unit division: 0
Unit division: 0

## 2024-01-03 ENCOUNTER — Ambulatory Visit (INDEPENDENT_AMBULATORY_CARE_PROVIDER_SITE_OTHER): Payer: Self-pay | Admitting: Family Medicine

## 2024-01-03 ENCOUNTER — Other Ambulatory Visit: Payer: Self-pay

## 2024-01-03 VITALS — BP 119/75 | HR 112 | Wt 172.8 lb

## 2024-01-03 DIAGNOSIS — O42919 Preterm premature rupture of membranes, unspecified as to length of time between rupture and onset of labor, unspecified trimester: Secondary | ICD-10-CM | POA: Diagnosis not present

## 2024-01-03 DIAGNOSIS — Z3A20 20 weeks gestation of pregnancy: Secondary | ICD-10-CM

## 2024-01-03 DIAGNOSIS — Z8679 Personal history of other diseases of the circulatory system: Secondary | ICD-10-CM

## 2024-01-03 DIAGNOSIS — O4102X0 Oligohydramnios, second trimester, not applicable or unspecified: Secondary | ICD-10-CM

## 2024-01-03 DIAGNOSIS — O98812 Other maternal infectious and parasitic diseases complicating pregnancy, second trimester: Secondary | ICD-10-CM | POA: Diagnosis not present

## 2024-01-03 DIAGNOSIS — O0990 Supervision of high risk pregnancy, unspecified, unspecified trimester: Secondary | ICD-10-CM

## 2024-01-03 DIAGNOSIS — A749 Chlamydial infection, unspecified: Secondary | ICD-10-CM

## 2024-01-03 DIAGNOSIS — R772 Abnormality of alphafetoprotein: Secondary | ICD-10-CM

## 2024-01-03 DIAGNOSIS — Z8759 Personal history of other complications of pregnancy, childbirth and the puerperium: Secondary | ICD-10-CM | POA: Diagnosis not present

## 2024-01-03 NOTE — Progress Notes (Signed)
   PRENATAL VISIT NOTE  Subjective:  [name] is a 33 y.o. [phone] at [redacted]w[redacted]d being seen today for ongoing prenatal care.  She is currently monitored for the following issues for this high-risk pregnancy and has Supervision of high risk pregnancy, antepartum; History of preterm delivery; History of postpartum gestational hypertension; PPROM at 17/6 (December 17, 2023); Anhydramnios; Alpha thalassemia silent carrier; Trichomonal vaginitis in pregnancy in first trimester; Chlamydia trachomatis infection in pregnancy; Lewis isoimmunization during pregnancy; Preterm labor; Elevated AFP; Abdominal pain affecting pregnancy; and Constipation during pregnancy in second trimester on their problem list.  Patient reports no bleeding, no contractions, and no cramping.  Contractions: Not present. Vag. Bleeding: Bloody Show.  Movement: Present. Denies leaking of fluid.   The following portions of the patient's history were reviewed and updated as appropriate: allergies, current medications, past family history, past medical history, past social history, past surgical history and problem list.   Objective:    Vitals:   01/03/24 0853  BP: 119/75  Pulse: (!) 112  Weight: 172 lb 12.8 oz (78.4 kg)    Fetal Status:  Fetal Heart Rate (bpm): 150   Movement: Present    General: Alert, oriented and cooperative. Patient is in no acute distress.  Skin: Skin is warm and dry. No rash noted.   Cardiovascular: Normal heart rate noted  Respiratory: Normal respiratory effort, no problems with respiration noted  Abdomen: Soft, gravid, appropriate for gestational age.  Pain/Pressure: Absent     Pelvic: Cervical exam deferred        Extremities: Normal range of motion.  Edema: Trace  Mental Status: Normal mood and affect. Normal behavior. Normal judgment and thought content.   Assessment and Plan:  Pregnancy: [id number] at [redacted]w[redacted]d 1. Supervision of high risk pregnancy, antepartum (Primary) FHR BP appropriate today  2.  Anhydramnios in second trimester, single or unspecified fetus Anhydramnios in the setting of P PROM.  Most recent AFI 1.5  3. Preterm labor in second trimester without delivery No signs of labor at this time  4. Preterm premature rupture of membranes (PPROM) with unknown onset of labor Rupture approximately 17 weeks.  Has had 2 hospitalizations since that time. Planning on readmission at 22 weeks 6 days for steroids. Patient knows hospital precautions  5. Elevated AFP 5.2 MOM Genetics referral previously placed Discussed ultrasound with the patient who reports the results from the ultrasound will not change her decisions on continuing with the pregnancy.  Discussed with MFM who recommended rescanning when she is admitted for steroids.  They would also like an MFM consult at that time.  6. Chlamydia infection affecting pregnancy in second trimester Will need TOC in late July  7. History of postpartum gestational hypertension Pressure within normal limits today  8. [redacted] weeks gestation of pregnancy   Preterm labor symptoms and general obstetric precautions including but not limited to vaginal bleeding, contractions, leaking of fluid and fetal movement were reviewed in detail with the patient. Please refer to After Visit Summary for other counseling recommendations.   No follow-ups on file.  Future Appointments  Date Time Provider Department Center  01/10/2024  1:35 PM Milly Olam DELENA EDDY [facility]  01/17/2024  1:15 PM Zina Jerilynn DELENA, MD [facility]    Norleen LULLA Rover, MD

## 2024-01-08 ENCOUNTER — Other Ambulatory Visit (HOSPITAL_COMMUNITY): Payer: Self-pay

## 2024-01-08 ENCOUNTER — Other Ambulatory Visit: Payer: Self-pay

## 2024-01-08 ENCOUNTER — Encounter (HOSPITAL_COMMUNITY): Payer: Self-pay | Admitting: Obstetrics & Gynecology

## 2024-01-08 ENCOUNTER — Inpatient Hospital Stay (HOSPITAL_COMMUNITY)
Admission: AD | Admit: 2024-01-08 | Discharge: 2024-01-08 | Disposition: A | Discharge status: Home / Self Care | Payer: Self-pay | Attending: Obstetrics & Gynecology | Admitting: Obstetrics & Gynecology

## 2024-01-08 ENCOUNTER — Inpatient Hospital Stay (HOSPITAL_COMMUNITY)

## 2024-01-08 DIAGNOSIS — Z3A21 21 weeks gestation of pregnancy: Secondary | ICD-10-CM | POA: Diagnosis not present

## 2024-01-08 DIAGNOSIS — K8020 Calculus of gallbladder without cholecystitis without obstruction: Secondary | ICD-10-CM

## 2024-01-08 DIAGNOSIS — Z7982 Long term (current) use of aspirin: Secondary | ICD-10-CM | POA: Insufficient documentation

## 2024-01-08 DIAGNOSIS — Z8759 Personal history of other complications of pregnancy, childbirth and the puerperium: Secondary | ICD-10-CM | POA: Diagnosis not present

## 2024-01-08 DIAGNOSIS — O26612 Liver and biliary tract disorders in pregnancy, second trimester: Secondary | ICD-10-CM | POA: Diagnosis not present

## 2024-01-08 DIAGNOSIS — R109 Unspecified abdominal pain: Secondary | ICD-10-CM

## 2024-01-08 DIAGNOSIS — O09212 Supervision of pregnancy with history of pre-term labor, second trimester: Secondary | ICD-10-CM | POA: Insufficient documentation

## 2024-01-08 DIAGNOSIS — F17210 Nicotine dependence, cigarettes, uncomplicated: Secondary | ICD-10-CM | POA: Diagnosis not present

## 2024-01-08 DIAGNOSIS — O42912 Preterm premature rupture of membranes, unspecified as to length of time between rupture and onset of labor, second trimester: Secondary | ICD-10-CM | POA: Insufficient documentation

## 2024-01-08 DIAGNOSIS — O09292 Supervision of pregnancy with other poor reproductive or obstetric history, second trimester: Secondary | ICD-10-CM | POA: Insufficient documentation

## 2024-01-08 DIAGNOSIS — O99332 Smoking (tobacco) complicating pregnancy, second trimester: Secondary | ICD-10-CM | POA: Diagnosis not present

## 2024-01-08 DIAGNOSIS — A5901 Trichomonal vulvovaginitis: Secondary | ICD-10-CM | POA: Diagnosis not present

## 2024-01-08 DIAGNOSIS — O99352 Diseases of the nervous system complicating pregnancy, second trimester: Secondary | ICD-10-CM | POA: Diagnosis not present

## 2024-01-08 DIAGNOSIS — R1084 Generalized abdominal pain: Secondary | ICD-10-CM | POA: Diagnosis not present

## 2024-01-08 DIAGNOSIS — O26899 Other specified pregnancy related conditions, unspecified trimester: Secondary | ICD-10-CM | POA: Diagnosis not present

## 2024-01-08 DIAGNOSIS — O200 Threatened abortion: Secondary | ICD-10-CM | POA: Diagnosis not present

## 2024-01-08 DIAGNOSIS — O26892 Other specified pregnancy related conditions, second trimester: Secondary | ICD-10-CM | POA: Diagnosis not present

## 2024-01-08 DIAGNOSIS — I959 Hypotension, unspecified: Secondary | ICD-10-CM | POA: Diagnosis not present

## 2024-01-08 DIAGNOSIS — O23592 Infection of other part of genital tract in pregnancy, second trimester: Secondary | ICD-10-CM

## 2024-01-08 DIAGNOSIS — O98312 Other infections with a predominantly sexual mode of transmission complicating pregnancy, second trimester: Secondary | ICD-10-CM | POA: Insufficient documentation

## 2024-01-08 LAB — COMPREHENSIVE METABOLIC PANEL WITH GFR
ALT: 12 U/L (ref 0–44)
AST: 15 U/L (ref 15–41)
Albumin: 2.7 g/dL — ABNORMAL LOW (ref 3.5–5.0)
Alkaline Phosphatase: 46 U/L (ref 38–126)
Anion gap: 8 (ref 5–15)
BUN: <5 mg/dL — ABNORMAL LOW (ref 6–20)
CO2: 21 mmol/L — ABNORMAL LOW (ref 22–32)
Calcium: 8.8 mg/dL — ABNORMAL LOW (ref 8.9–10.3)
Chloride: 105 mmol/L (ref 98–111)
Creatinine, Ser: 0.54 mg/dL (ref 0.44–1.00)
GFR, Estimated: >60 mL/min (ref >60)
Glucose, Bld: 79 mg/dL (ref 70–99)
Potassium: 3.9 mmol/L (ref 3.5–5.1)
Sodium: 134 mmol/L — ABNORMAL LOW (ref 135–145)
Total Bilirubin: 0.6 mg/dL (ref 0.0–1.2)
Total Protein: 6.4 g/dL — ABNORMAL LOW (ref 6.5–8.1)

## 2024-01-08 LAB — URINALYSIS, ROUTINE W REFLEX MICROSCOPIC
Bacteria, UA: NONE SEEN
Bilirubin Urine: NEGATIVE
Glucose, UA: NEGATIVE mg/dL
Hgb urine dipstick: NEGATIVE
Ketones, ur: NEGATIVE mg/dL
Nitrite: NEGATIVE
Protein, ur: NEGATIVE mg/dL
Specific Gravity, Urine: 1.024 (ref 1.005–1.030)
pH: 6 (ref 5.0–8.0)

## 2024-01-08 LAB — LIPASE, BLOOD: Lipase: 24 U/L (ref 11–51)

## 2024-01-08 LAB — WET PREP, GENITAL
Clue Cells Wet Prep HPF POC: NONE SEEN
Sperm: NONE SEEN
WBC, Wet Prep HPF POC: >=10 — AB (ref <10)
Yeast Wet Prep HPF POC: NONE SEEN

## 2024-01-08 LAB — CBC
HCT: 32.5 % — ABNORMAL LOW (ref 36.0–46.0)
Hemoglobin: 10.2 g/dL — ABNORMAL LOW (ref 12.0–15.0)
MCH: 24.4 pg — ABNORMAL LOW (ref 26.0–34.0)
MCHC: 31.4 g/dL (ref 30.0–36.0)
MCV: 77.8 fL — ABNORMAL LOW (ref 80.0–100.0)
Platelets: 358 10*3/uL (ref 150–400)
RBC: 4.18 MIL/uL (ref 3.87–5.11)
RDW: 23 % — ABNORMAL HIGH (ref 11.5–15.5)
WBC: 10.3 10*3/uL (ref 4.0–10.5)
nRBC: 0.2 % (ref 0.0–0.2)

## 2024-01-08 LAB — AMYLASE: Amylase: 54 U/L (ref 28–100)

## 2024-01-08 MED ORDER — CYCLOBENZAPRINE HCL 5 MG PO TABS
10.0000 mg | ORAL_TABLET | Freq: Once | ORAL | Status: AC
  Administered 2024-01-08: 10 mg via ORAL
  Filled 2024-01-08: qty 2

## 2024-01-08 MED ORDER — METRONIDAZOLE 500 MG PO TABS: 500.0000 mg | ORAL_TABLET | Freq: Two times a day (BID) | ORAL | 0 refills | Status: DC

## 2024-01-08 MED ORDER — PROMETHAZINE HCL 25 MG PO TABS: 25.0000 mg | ORAL_TABLET | Freq: Four times a day (QID) | ORAL | 0 refills | Status: DC | PRN

## 2024-01-08 MED ORDER — METRONIDAZOLE 500 MG PO TABS
500.0000 mg | ORAL_TABLET | Freq: Two times a day (BID) | ORAL | 0 refills | Status: DC
  Filled 2024-01-08: qty 14, 7d supply, fill #0

## 2024-01-08 MED ORDER — ACETAMINOPHEN 500 MG PO TABS
1000.0000 mg | ORAL_TABLET | Freq: Once | ORAL | Status: AC
  Administered 2024-01-08: 1000 mg via ORAL
  Filled 2024-01-08: qty 2

## 2024-01-08 MED ORDER — PROMETHAZINE HCL 25 MG PO TABS
25.0000 mg | ORAL_TABLET | Freq: Once | ORAL | Status: AC
  Administered 2024-01-08: 25 mg via ORAL
  Filled 2024-01-08: qty 1

## 2024-01-08 MED ORDER — PROMETHAZINE HCL 25 MG PO TABS
25.0000 mg | ORAL_TABLET | Freq: Four times a day (QID) | ORAL | 0 refills | Status: DC | PRN
  Filled 2024-01-08: qty 40, 10d supply, fill #0

## 2024-01-08 NOTE — MAU Note (Signed)
.  [name] is a 33 y.o. at [redacted]w[redacted]d here in MAU reporting: right upper abdominal pain starting 0600 this morning. Took a tylenol  and wake up around 0805 and noticed the pain was worse. Pt's water broke around [redacted] weeks gestation. Has been experiencing some light vaginal bleeding since. Endorses FM at this time.   Pain score: 7/10 Vitals:   01/08/24 1106  BP: 120/76  Pulse: 87  Resp: 16  Temp: 98.1 F (36.7 C)     FHT: Bedside US  requested  Lab orders placed from triage: UA

## 2024-01-08 NOTE — MAU Provider Note (Signed)
 Chief Complaint:  Abdominal Pain   HPI   Event Date/Time   First Provider Initiated Contact with Patient 01/08/24 1128      [name] is a 33 y.o. [phone] at [redacted]w[redacted]d who presents to maternity admissions via EMS  reporting severe RUQ pain that started at 0600 this morning. She is known PPROM since 17.6 weeks (12/17/23) GA but reports positive fetal movements. She reports some light vaginal bleeding this morning but denies lower abdominal cramping.   Patient also denies any fever, chills, N/V   She is scheduled for an antepartum admission at [redacted]w[redacted]d for Antenatal steroids etc   Pregnancy Course: [facility] ( Prenatal records reviewed)  Past Medical History:  Diagnosis Date   Abdominal trauma 10/21/2011   Anemia    Blood transfusion    2009-02-02 s/p SVD   Chronic back pain    Dental caries 10/22/2011   Dental referral  IMO SNOMED Dx Update Oct 2024     Depression    not on meds   Encounter for Depo-Provera  contraception 06/15/2012   GERD (gastroesophageal reflux disease)    High-risk pregnancy supervision 10/22/2011   History of preterm delivery, currently pregnant 10/22/2011   Premature dilation and effacement, Rx Prometrium       Mental disorder    NSVD (normal spontaneous vaginal delivery) 12/11/2011   Pelvic pain in female 06/15/2012   Rt aadnexal tenderness ? Thickening> pelvic US      Pregnancy induced hypertension 07/13/2008   required Mag Sulfate   Preterm labor    first pregnancy/current pregnancy   UTI (urinary tract infection) in pregnancy, antepartum    09/2011 tx with abx   OB History  Gravida Para Term Preterm AB Living  4 3 2 1  0 3  SAB IAB Ectopic Multiple Live Births  0 0 0 0 3    # Outcome Date GA Lbr Len/2nd Weight Sex Type Anes PTL Lv  4 Current           3 Term 12/11/11 [redacted]w[redacted]d 273:55 / 00:09 3465 g M Vag-Spont None  LIV  2 Preterm 04/08/10 [redacted]w[redacted]d   M Vag-Spont   LIV     Birth Comments: PPROM at 31 wk,+GBS, IOL at 54 wk  1 Term 10/16/08 [redacted]w[redacted]d   F  Vag-Spont   LIV   Past Surgical History:  Procedure Laterality Date   FINGER SURGERY     right thumb in childhood   Family History  Problem Relation Age of Onset   Asthma Mother    Diabetes Father    Hypertension Father    Heart disease Father        afib, died Feb 03, 2020   Mental illness Brother        bipolar, schizophrenia   Anesthesia problems Neg Hx    Social History   Tobacco Use   Smoking status: Some Days    Types: Cigarettes   Smokeless tobacco: Never  Vaping Use   Vaping status: Never Used  Substance Use Topics   Alcohol use: Not Currently    Comment: occ   Drug use: Not Currently   No Known Allergies Medications Prior to Admission  Medication Sig Dispense Refill Last Dose/Taking   acetaminophen  (TYLENOL ) 325 MG tablet Take 2 tablets (650 mg total) by mouth every 6 (six) hours as needed.   01/08/2024   aspirin  EC 81 MG tablet Take 2 tablets (162 mg total) by mouth daily. Swallow whole. 60 tablet 12 01/08/2024   ferrous sulfate  325 (65 FE)  MG tablet TAKE 1 TABLET BY MOUTH EVERY DAY 30 tablet 0 01/08/2024   Prenatal Vit-Fe Fumarate-FA (PRENATAL MULTIVITAMIN) TABS tablet Take 1 tablet by mouth daily at 12 noon. 30 tablet 0 01/08/2024   cyclobenzaprine  (FLEXERIL ) 10 MG tablet Take 1 tablet (10 mg total) by mouth 2 (two) times daily as needed for muscle spasms. (Patient not taking: Reported on 01/03/2024) 20 tablet 0    ondansetron  (ZOFRAN -ODT) 4 MG disintegrating tablet Take 1 tablet (4 mg total) by mouth every 6 (six) hours as needed for nausea. 20 tablet 0    polyethylene glycol (MIRALAX ) 17 g packet Take 17 g by mouth every 3 (three) days. 14 each 0     I have reviewed patient's Past Medical Hx, Surgical Hx, Family Hx, Social Hx, medications and allergies.   ROS  Pertinent items noted in HPI and remainder of comprehensive ROS otherwise negative.   PHYSICAL EXAM  Patient Vitals for the past 24 hrs:  BP Temp Temp src Pulse Resp Height  01/08/24 1106 120/76 98.1 F (36.7  C) Oral 87 16 5' 7 (1.702 m)    Constitutional: Well-developed, well-nourished female in no acute distress.  Cardiovascular: normal rate & rhythm, warm and well-perfused Respiratory: normal effort, no problems with respiration noted GI: Abd soft, mild RUQ pain on palpation, No guarding, no rebound, no rigidity  MS: Extremities nontender, no edema, normal ROM Neurologic: Alert and oriented x 4.  GU: no CVA tenderness Pelvic: exam chaperoned by Lorice Montenegro RN  SSE: No pooling, no evidence of vaginal bleeding in the vault, scant amount of discharge noted - swabs sent for culture, cervix is visually closed      Fetal Tracing:   Pt informed that the ultrasound is considered a limited OB ultrasound and is not intended to be a complete ultrasound exam.  Patient also informed that the ultrasound is not being completed with the intent of assessing for fetal or placental anomalies or any pelvic abnormalities.  Explained that the purpose of today's ultrasound is to assess for  FHR due to known PPROM at 17.[redacted] wks GA .  Patient acknowledges the purpose of the exam and the limitations of the study.   FHR via Ultrasound doppler at 150 bpm     Labs: Results for orders placed or performed during the hospital encounter of 01/08/24 (from the past 24 hours)  CBC     Status: Abnormal   Collection Time: 01/08/24 11:36 AM  Result Value Ref Range   WBC 10.3 4.0 - 10.5 K/uL   RBC 4.18 3.87 - 5.11 MIL/uL   Hemoglobin 10.2 (L) 12.0 - 15.0 g/dL   HCT 67.4 (L) 63.9 - 53.9 %   MCV 77.8 (L) 80.0 - 100.0 fL   MCH 24.4 (L) 26.0 - 34.0 pg   MCHC 31.4 30.0 - 36.0 g/dL   RDW 76.9 (H) 88.4 - 84.4 %   Platelets 358 150 - 400 K/uL   nRBC 0.2 0.0 - 0.2 %  Comprehensive metabolic panel     Status: Abnormal   Collection Time: 01/08/24 11:36 AM  Result Value Ref Range   Sodium 134 (L) 135 - 145 mmol/L   Potassium 3.9 3.5 - 5.1 mmol/L   Chloride 105 98 - 111 mmol/L   CO2 21 (L) 22 - 32 mmol/L   Glucose, Bld 79 70 -  99 mg/dL   BUN <5 (L) 6 - 20 mg/dL   Creatinine, Ser 9.45 0.44 - 1.00 mg/dL   Calcium  8.8 (L)  8.9 - 10.3 mg/dL   Total Protein 6.4 (L) 6.5 - 8.1 g/dL   Albumin 2.7 (L) 3.5 - 5.0 g/dL   AST 15 15 - 41 U/L   ALT 12 0 - 44 U/L   Alkaline Phosphatase 46 38 - 126 U/L   Total Bilirubin 0.6 0.0 - 1.2 mg/dL   GFR, Estimated >39 >39 mL/min   Anion gap 8 5 - 15  Wet prep, genital     Status: Abnormal   Collection Time: 01/08/24 11:43 AM  Result Value Ref Range   Yeast Wet Prep HPF POC NONE SEEN NONE SEEN   Trich, Wet Prep PRESENT (A) NONE SEEN   Clue Cells Wet Prep HPF POC NONE SEEN NONE SEEN   WBC, Wet Prep HPF POC >=10 (A) <10   Sperm NONE SEEN   Urinalysis, Routine w reflex microscopic -Urine, Random     Status: Abnormal   Collection Time: 01/08/24 11:43 AM  Result Value Ref Range   Color, Urine YELLOW YELLOW   APPearance HAZY (A) CLEAR   Specific Gravity, Urine 1.024 1.005 - 1.030   pH 6.0 5.0 - 8.0   Glucose, UA NEGATIVE NEGATIVE mg/dL   Hgb urine dipstick NEGATIVE NEGATIVE   Bilirubin Urine NEGATIVE NEGATIVE   Ketones, ur NEGATIVE NEGATIVE mg/dL   Protein, ur NEGATIVE NEGATIVE mg/dL   Nitrite NEGATIVE NEGATIVE   Leukocytes,Ua SMALL (A) NEGATIVE   RBC / HPF 0-5 0 - 5 RBC/hpf   WBC, UA 6-10 0 - 5 WBC/hpf   Bacteria, UA NONE SEEN NONE SEEN   Squamous Epithelial / HPF 0-5 0 - 5 /HPF   Mucus PRESENT   Amylase     Status: None   Collection Time: 01/08/24  1:25 PM  Result Value Ref Range   Amylase 54 28 - 100 U/L  Lipase, blood     Status: None   Collection Time: 01/08/24  1:25 PM  Result Value Ref Range   Lipase 24 11 - 51 U/L    Imaging:  US  Abdomen Limited RUQ (LIVER/GB) Result Date: 01/08/2024 CLINICAL DATA:  [id number] Chronic RUQ pain [id number] EXAM: ULTRASOUND ABDOMEN LIMITED RIGHT UPPER QUADRANT COMPARISON:  None Available. FINDINGS: Gallbladder: A couple of punctate stones or nodular sludge noted. In the cystic duct, there is a 6 mm calculus. No wall thickening or  pericholecystic fluid. Positive sonographic Murphy's sign noted by sonographer. Common bile duct: Diameter: 4 mm Liver: Normal echogenicity. No focal lesion identified. No intrahepatic biliary ductal dilation. Portal vein is patent on color Doppler imaging with normal direction of blood flow towards the liver. Right Kidney: Partially visualized. No mass. No hydronephrosis or nephrolithiasis. Other: None. IMPRESSION: 6 mm calculus in the cystic duct. While there is no wall thickening or pericholecystic fluid, the sonographer reports a positive sonographic Murphy sign. This could represent early changes of acute cholecystitis and a nuclear medicine hepatobiliary scan is recommended for further characterization. Electronically Signed   By: Rogelia Myers M.D.   On: 01/08/2024 13:03    MDM & MAU COURSE  MDM:  HIGH  CBC: c/w anemia ( taking oral supplements) CMP: unremarkable Amylase/Lipase : Normal  U/A: negative for UTI Vaginal Swabs : Positive for Trich ( RX to be sent at discharge and partner notification and treatment needed - patient aware) RUQ sono to r/o chololithiasis , cholecystitis in pregnancy ( Findings c/w 6 mm calculus in the cystic duct non obstructing) BSUS performed to assess for FHR due to known PPROM at 17.6 GA  Tylenol /Flexeril  for pain relief  - Discussed findings of RUQ sono with Dr Jayne [facility] Attending) will plan to give patient phenergan  and proceed with out patient management of Cholelithiasis with Diet and Phenergan   - Positive vaginal trichomonas  - Plan for discharge with treatment Flagyl  500 mg BID x 7   MAU Course: Orders Placed This Encounter  Procedures   Wet prep, genital   US  Abdomen Limited RUQ (LIVER/GB)   CBC   Comprehensive metabolic panel   Urinalysis, Routine w reflex microscopic -Urine, Random   Amylase   Lipase, blood   Discharge patient Discharge disposition: 01-Home or Self Care; Discharge patient date: 01/08/2024   Meds ordered this encounter   Medications   acetaminophen  (TYLENOL ) tablet 1,000 mg   cyclobenzaprine  (FLEXERIL ) tablet 10 mg   promethazine  (PHENERGAN ) tablet 25 mg   promethazine  (PHENERGAN ) 25 MG tablet    Sig: Take 1 tablet (25 mg total) by mouth every 6 (six) hours as needed.    Dispense:  40 tablet    Refill:  0    Supervising Provider:   PRATT, TANYA S [[id number]]   metroNIDAZOLE  (FLAGYL ) 500 MG tablet    Sig: Take 1 tablet (500 mg total) by mouth 2 (two) times daily.    Dispense:  14 tablet    Refill:  0    Supervising Provider:   PRATT, TANYA S [[id number]]    I have reviewed the patient chart and performed the physical exam . I have ordered & interpreted the lab results and reviewed them with the patient  Medications ordered as stated below.  A/P as described below.  Counseling and education provided and patient agreeable  with plan as described below. Verbalized understanding.    ASSESSMENT   1. Trichomonal vaginitis during pregnancy in second trimester   2. [redacted] weeks gestation of pregnancy   3. History of preterm premature rupture of membranes (PPROM)   4. Calculus of gallbladder without cholecystitis without obstruction     PLAN  Discharge home in stable condition with return precautions.   Follow up with OB as scheduled  See AVS for full description of information given to the patient including both verbal and written. Patient verbalized understanding and agrees with the plan as described above.     Follow-up Information     Center for [facility] Follow up.   Specialty: Obstetrics and Gynecology Why: If symptoms worsen or fail to resolve, As scheduled for ongoing prenatal care Contact information: [address] [place]  [address] [phone]                Allergies as of 01/08/2024   No Known Allergies      Medication List     TAKE these medications    acetaminophen  325 MG tablet Commonly known as: Tylenol  Take 2  tablets (650 mg total) by mouth every 6 (six) hours as needed.   aspirin  EC 81 MG tablet Take 2 tablets (162 mg total) by mouth daily. Swallow whole.   cyclobenzaprine  10 MG tablet Commonly known as: FLEXERIL  Take 1 tablet (10 mg total) by mouth 2 (two) times daily as needed for muscle spasms.   ferrous sulfate  325 (65 FE) MG tablet TAKE 1 TABLET BY MOUTH EVERY DAY   metroNIDAZOLE  500 MG tablet Commonly known as: FLAGYL  Take 1 tablet (500 mg total) by mouth 2 (two) times daily.   ondansetron  4 MG disintegrating tablet Commonly known as: ZOFRAN -ODT Take 1  tablet (4 mg total) by mouth every 6 (six) hours as needed for nausea.   polyethylene glycol 17 g packet Commonly known as: MiraLax  Take 17 g by mouth every 3 (three) days.   Prenatal 27-1 MG Tabs Take 1 tablet by mouth daily at 12 noon.   promethazine  25 MG tablet Commonly known as: PHENERGAN  Take 1 tablet (25 mg total) by mouth every 6 (six) hours as needed.        Olam Dalton, MSN, [facility], Center for [employer]

## 2024-01-09 ENCOUNTER — Inpatient Hospital Stay (HOSPITAL_COMMUNITY)
Admission: AD | Admit: 2024-01-09 | Discharge: 2024-01-09 | Disposition: A | Discharge status: Home / Self Care | Payer: Self-pay | Attending: Obstetrics & Gynecology | Admitting: Obstetrics & Gynecology

## 2024-01-09 ENCOUNTER — Encounter (HOSPITAL_COMMUNITY): Payer: Self-pay | Admitting: Obstetrics & Gynecology

## 2024-01-09 ENCOUNTER — Inpatient Hospital Stay (HOSPITAL_BASED_OUTPATIENT_CLINIC_OR_DEPARTMENT_OTHER)

## 2024-01-09 DIAGNOSIS — R109 Unspecified abdominal pain: Secondary | ICD-10-CM | POA: Diagnosis not present

## 2024-01-09 DIAGNOSIS — O23592 Infection of other part of genital tract in pregnancy, second trimester: Secondary | ICD-10-CM

## 2024-01-09 DIAGNOSIS — O26899 Other specified pregnancy related conditions, unspecified trimester: Secondary | ICD-10-CM

## 2024-01-09 DIAGNOSIS — Z3A21 21 weeks gestation of pregnancy: Secondary | ICD-10-CM | POA: Diagnosis not present

## 2024-01-09 DIAGNOSIS — O0990 Supervision of high risk pregnancy, unspecified, unspecified trimester: Secondary | ICD-10-CM | POA: Diagnosis not present

## 2024-01-09 DIAGNOSIS — Z743 Need for continuous supervision: Secondary | ICD-10-CM | POA: Diagnosis not present

## 2024-01-09 DIAGNOSIS — A5901 Trichomonal vulvovaginitis: Secondary | ICD-10-CM | POA: Diagnosis not present

## 2024-01-09 DIAGNOSIS — O4702 False labor before 37 completed weeks of gestation, second trimester: Secondary | ICD-10-CM | POA: Diagnosis not present

## 2024-01-09 DIAGNOSIS — O42919 Preterm premature rupture of membranes, unspecified as to length of time between rupture and onset of labor, unspecified trimester: Secondary | ICD-10-CM

## 2024-01-09 DIAGNOSIS — O09212 Supervision of pregnancy with history of pre-term labor, second trimester: Secondary | ICD-10-CM

## 2024-01-09 DIAGNOSIS — O26892 Other specified pregnancy related conditions, second trimester: Secondary | ICD-10-CM | POA: Diagnosis not present

## 2024-01-09 DIAGNOSIS — O42912 Preterm premature rupture of membranes, unspecified as to length of time between rupture and onset of labor, second trimester: Secondary | ICD-10-CM

## 2024-01-09 DIAGNOSIS — O4292 Full-term premature rupture of membranes, unspecified as to length of time between rupture and onset of labor: Secondary | ICD-10-CM | POA: Diagnosis not present

## 2024-01-09 DIAGNOSIS — O479 False labor, unspecified: Secondary | ICD-10-CM | POA: Diagnosis not present

## 2024-01-09 LAB — CBC WITH DIFFERENTIAL/PLATELET
Abs Immature Granulocytes: 0 10*3/uL (ref 0.00–0.07)
Basophils Absolute: 0 10*3/uL (ref 0.0–0.1)
Basophils Relative: 0 %
Eosinophils Absolute: 0.2 10*3/uL (ref 0.0–0.5)
Eosinophils Relative: 2 %
HCT: 36.8 % (ref 36.0–46.0)
Hemoglobin: 11.6 g/dL — ABNORMAL LOW (ref 12.0–15.0)
Lymphocytes Relative: 23 %
Lymphs Abs: 2.3 10*3/uL (ref 0.7–4.0)
MCH: 24.8 pg — ABNORMAL LOW (ref 26.0–34.0)
MCHC: 31.5 g/dL (ref 30.0–36.0)
MCV: 78.8 fL — ABNORMAL LOW (ref 80.0–100.0)
Monocytes Absolute: 0.5 10*3/uL (ref 0.1–1.0)
Monocytes Relative: 5 %
Neutro Abs: 6.9 10*3/uL (ref 1.7–7.7)
Neutrophils Relative %: 70 %
Platelets: 390 10*3/uL (ref 150–400)
RBC: 4.67 MIL/uL (ref 3.87–5.11)
RDW: 23.7 % — ABNORMAL HIGH (ref 11.5–15.5)
WBC: 9.8 10*3/uL (ref 4.0–10.5)
nRBC: 0 % (ref 0.0–0.2)
nRBC: 0 /100{WBCs}

## 2024-01-09 LAB — COMPREHENSIVE METABOLIC PANEL WITH GFR
ALT: 16 U/L (ref 0–44)
AST: 29 U/L (ref 15–41)
Albumin: 2.9 g/dL — ABNORMAL LOW (ref 3.5–5.0)
Alkaline Phosphatase: 48 U/L (ref 38–126)
Anion gap: 12 (ref 5–15)
BUN: 6 mg/dL (ref 6–20)
CO2: 19 mmol/L — ABNORMAL LOW (ref 22–32)
Calcium: 9.3 mg/dL (ref 8.9–10.3)
Chloride: 105 mmol/L (ref 98–111)
Creatinine, Ser: 0.55 mg/dL (ref 0.44–1.00)
GFR, Estimated: >60 mL/min (ref >60)
Glucose, Bld: 78 mg/dL (ref 70–99)
Potassium: 4.4 mmol/L (ref 3.5–5.1)
Sodium: 136 mmol/L (ref 135–145)
Total Bilirubin: 0.7 mg/dL (ref 0.0–1.2)
Total Protein: 6.8 g/dL (ref 6.5–8.1)

## 2024-01-09 MED ORDER — CYCLOBENZAPRINE HCL 10 MG PO TABS: 10.0000 mg | ORAL_TABLET | Freq: Three times a day (TID) | ORAL | 0 refills | Status: DC | PRN

## 2024-01-09 MED ORDER — LACTATED RINGERS IV BOLUS
1000.0000 mL | Freq: Once | INTRAVENOUS | Status: AC
  Administered 2024-01-09: 1000 mL via INTRAVENOUS

## 2024-01-09 MED ORDER — FENTANYL CITRATE (PF) 100 MCG/2ML IJ SOLN
100.0000 ug | Freq: Once | INTRAMUSCULAR | Status: AC
  Administered 2024-01-09: 100 ug via INTRAVENOUS
  Filled 2024-01-09: qty 2

## 2024-01-09 MED ORDER — CYCLOBENZAPRINE HCL 5 MG PO TABS
10.0000 mg | ORAL_TABLET | Freq: Once | ORAL | Status: AC
  Administered 2024-01-09: 10 mg via ORAL
  Filled 2024-01-09: qty 2

## 2024-01-09 MED ORDER — MORPHINE SULFATE (PF) 4 MG/ML IV SOLN: 4.0000 mg | Freq: Once | INTRAVENOUS | Status: DC

## 2024-01-09 NOTE — MAU Provider Note (Cosign Needed Addendum)
 History     CSN: [id number]  Arrival date and time: 01/09/24 9275   Event Date/Time   First Provider Initiated Contact with Patient    Chief Complaint  Patient presents with   Contractions   Back Pain   Abdominal Pain    HPI  [name] is a 33 y.o. [id number] at [redacted]w[redacted]d who presents to the MAU for contractions. Patient presented to MAU yesterday, was diagnosed with Trichomoniasis and cholelithiasis and discharged home. Reports contraction type pain starting yesterday night, worsened acutely around 0600. Reports ctx every 1-2 minutes, lasting 1 min. Reports pain is 8/10. Tearful at bedside.   Past Medical History:  Diagnosis Date   Abdominal trauma 10/21/2011   Anemia    Blood transfusion    2009-02-01 s/p SVD   Chronic back pain    Dental caries 10/22/2011   Dental referral  IMO SNOMED Dx Update Oct 2024     Depression    not on meds   Encounter for Depo-Provera  contraception 06/15/2012   GERD (gastroesophageal reflux disease)    High-risk pregnancy supervision 10/22/2011   History of preterm delivery, currently pregnant 10/22/2011   Premature dilation and effacement, Rx Prometrium       Mental disorder    NSVD (normal spontaneous vaginal delivery) 12/11/2011   Pelvic pain in female 06/15/2012   Rt aadnexal tenderness ? Thickening> pelvic US      Pregnancy induced hypertension 07/13/2008   required Mag Sulfate   Preterm labor    first pregnancy/current pregnancy   UTI (urinary tract infection) in pregnancy, antepartum    09/2011 tx with abx    Past Surgical History:  Procedure Laterality Date   FINGER SURGERY     right thumb in childhood    Family History  Problem Relation Age of Onset   Asthma Mother    Diabetes Father    Hypertension Father    Heart disease Father        afib, died Feb 02, 2020   Mental illness Brother        bipolar, schizophrenia   Anesthesia problems Neg Hx     Social History   Tobacco Use   Smoking status: Some Days    Types: Cigarettes    Smokeless tobacco: Never  Vaping Use   Vaping status: Never Used  Substance Use Topics   Alcohol use: Not Currently    Comment: occ   Drug use: Not Currently    Allergies: No Known Allergies  No medications prior to admission.    ROS reviewed and pertinent positives and negatives as documented in HPI.  Physical Exam   Blood pressure 116/76, pulse 95, temperature 98 F (36.7 C), temperature source Oral, resp. rate (!) 22, last menstrual period 08/14/2023.  Physical Exam Constitutional:      General: She is not in acute distress.    Appearance: Normal appearance. She is not ill-appearing.  HENT:     Head: Normocephalic and atraumatic.   Cardiovascular:     Rate and Rhythm: Normal rate.  Pulmonary:     Effort: Pulmonary effort is normal.     Breath sounds: Normal breath sounds.  Abdominal:     Palpations: Abdomen is soft.     Tenderness: There is no abdominal tenderness. There is no guarding.  Genitourinary:    Comments: Speculum exam performed - copious thin yellow green discharge noted, cervix visually 1cm and thick  Musculoskeletal:        General: Normal range of motion.   Skin:  General: Skin is warm and dry.     Findings: No rash.   Neurological:     General: No focal deficit present.     Mental Status: She is alert and oriented to person, place, and time.     MAU Course  Procedures  MDM 33 y.o. [id number] at [redacted]w[redacted]d presenting for contractions and abdominal pain concerning for preterm labor. Patient with known PPROM at [redacted]w[redacted]d, has had two recent admissions for the same. Reports regular more painful contractions starting this AM. Patient tearful and uncomfortable w contractions. Speculum exam performed, cervix thick and visually 1cm. Suspect preterm labor. Discussed with patient if she is in preterm labor, gestational age limits attempts for fetal resuscitation. Will obtain U/S to get an idea of EFW to further assess if this may change management of neonatal  resuscitation options.   Patient care handed off to Rocky Satterfield, NP.  Alain Sor, MD OB Fellow, Faculty Practice [facility], Center for [facility]   Exam repeated. Cervix visually closed.  Consulted with Dr. Fredirick who repeated exam an hour later - cervix still visually closed. Patient given dose of flexeril  & reports improvement in symptoms. Pain down to 2/10 from 8/10. Patient remains afebrile, no leukocytosis, & FHT present. Stable for discharge.   Assessment and Plan     1. PPROM at 17/6 (December 17, 2023)  -Reviewed infection precautions & reasons to return -Next appt tomorrow  2. Trichomonal vaginitis in pregnancy in first trimester  -Patient did not pick up flagyl  yesterday due to transportation issues. At time of discharge, she reports she has a ride to the pharmacy. Will pick up flagyl .   3. Abdominal pain affecting pregnancy  -At time of discharge, pain 2/10 & patient resting comfortably in the bed -Rx flexeril  prn -Reviewed return precautions  4. [redacted] weeks gestation of pregnancy     Rocky Satterfield

## 2024-01-09 NOTE — MAU Note (Signed)
 [name] is a 33 y.o. at [redacted]w[redacted]d here in MAU reporting: arrived via ems with reports of waking up with ctxs. Pt PPROMed at 17weeks. Was in MAU yesterday for RUQ pain and was diagnosed with gallstones. States the ctxs are every 1-2 mins lasting for 1 min. Put pad on this am and reports an increase in leaking and it is yellow in color. Denies any VB. Reports an increase in pelvic and back pressure.  Onset of complaint: ongoing  Pain score: 8 Vitals:   01/09/24 0727  BP: 116/76  Pulse: 95  Resp: (!) 22  Temp: 98 F (36.7 C)     FHT:128 doppler Lab orders placed from triage:

## 2024-01-10 ENCOUNTER — Ambulatory Visit: Payer: Self-pay | Admitting: Advanced Practice Midwife

## 2024-01-10 ENCOUNTER — Other Ambulatory Visit: Payer: Self-pay

## 2024-01-10 VITALS — BP 122/84 | HR 90 | Wt 175.4 lb

## 2024-01-10 DIAGNOSIS — O26899 Other specified pregnancy related conditions, unspecified trimester: Secondary | ICD-10-CM

## 2024-01-10 DIAGNOSIS — O0990 Supervision of high risk pregnancy, unspecified, unspecified trimester: Secondary | ICD-10-CM

## 2024-01-10 DIAGNOSIS — O23592 Infection of other part of genital tract in pregnancy, second trimester: Secondary | ICD-10-CM | POA: Diagnosis not present

## 2024-01-10 DIAGNOSIS — O42912 Preterm premature rupture of membranes, unspecified as to length of time between rupture and onset of labor, second trimester: Secondary | ICD-10-CM | POA: Diagnosis not present

## 2024-01-10 DIAGNOSIS — O42919 Preterm premature rupture of membranes, unspecified as to length of time between rupture and onset of labor, unspecified trimester: Secondary | ICD-10-CM

## 2024-01-10 DIAGNOSIS — R109 Unspecified abdominal pain: Secondary | ICD-10-CM

## 2024-01-10 DIAGNOSIS — O0992 Supervision of high risk pregnancy, unspecified, second trimester: Secondary | ICD-10-CM

## 2024-01-10 DIAGNOSIS — K8020 Calculus of gallbladder without cholecystitis without obstruction: Secondary | ICD-10-CM

## 2024-01-10 DIAGNOSIS — Z3A21 21 weeks gestation of pregnancy: Secondary | ICD-10-CM | POA: Diagnosis not present

## 2024-01-10 DIAGNOSIS — O26892 Other specified pregnancy related conditions, second trimester: Secondary | ICD-10-CM

## 2024-01-10 DIAGNOSIS — A5901 Trichomonal vulvovaginitis: Secondary | ICD-10-CM

## 2024-01-10 NOTE — Progress Notes (Signed)
   PRENATAL VISIT NOTE  Subjective:  [name] is a 33 y.o. [phone] at [redacted]w[redacted]d being seen today for ongoing prenatal care.  She is currently monitored for the following issues for this high-risk pregnancy and has Supervision of high risk pregnancy, antepartum; History of preterm delivery; History of postpartum gestational hypertension; PPROM at 17/6 (December 17, 2023); Anhydramnios; Alpha thalassemia silent carrier; Trichomonal vaginitis in pregnancy in first trimester; Chlamydia trachomatis infection in pregnancy; Lewis isoimmunization during pregnancy; Preterm labor; Elevated AFP; Abdominal pain affecting pregnancy; and Constipation during pregnancy in second trimester on their problem list.  Patient reports occasional contractions.  Contractions: Irritability. Vag. Bleeding: None.  Movement: Present. Denies leaking of fluid.   The following portions of the patient's history were reviewed and updated as appropriate: allergies, current medications, past family history, past medical history, past social history, past surgical history and problem list.   Objective:    Vitals:   01/10/24 1404  BP: 122/84  Pulse: 90  Weight: 175 lb 6.4 oz (79.6 kg)    Fetal Status:  Fetal Heart Rate (bpm): 159   Movement: Present    General: Alert, oriented and cooperative. Patient is in no acute distress.  Skin: Skin is warm and dry. No rash noted.   Cardiovascular: Normal heart rate noted  Respiratory: Normal respiratory effort, no problems with respiration noted  Abdomen: Soft, gravid, appropriate for gestational age.  Pain/Pressure: Present     Pelvic: Cervical exam deferred        Extremities: Normal range of motion.  Edema: None  Mental Status: Normal mood and affect. Normal behavior. Normal judgment and thought content.   Assessment and Plan:  Pregnancy: [id number] at [redacted]w[redacted]d 1. Supervision of high risk pregnancy, antepartum (Primary) --Anticipatory guidance about next visits/weeks of pregnancy  given.   2. PPROM at 17/6 (December 17, 2023) --Plan for readmission @ [redacted]w[redacted]d for BMZ/etc  3. Trichomonal vaginitis in pregnancy in first trimester --First TOC negative, pt positive on 01/08/24, retreated.  4. Abdominal pain affecting pregnancy --Upper abdominal pain, see cholecystitis below  5. [redacted] weeks gestation of pregnancy   6. Calculus of gallbladder without cholecystitis without obstruction --Reviewed low fat diet discussed in MAU at pt visit on 01/08/24  Preterm labor symptoms and general obstetric precautions including but not limited to vaginal bleeding, contractions, leaking of fluid and fetal movement were reviewed in detail with the patient. Please refer to After Visit Summary for other counseling recommendations.   Return for As scheduled.  Future Appointments  Date Time Provider Department Center  01/17/2024  1:15 PM Zina Jerilynn LABOR, MD [facility]    Olam Boards, CNM

## 2024-01-11 LAB — GC/CHLAMYDIA PROBE AMP (~~LOC~~) NOT AT ARMC
Chlamydia: NEGATIVE
Comment: NEGATIVE
Comment: NORMAL
Neisseria Gonorrhea: NEGATIVE

## 2024-01-13 LAB — TYPE AND SCREEN
ABO/RH(D): O POS
Antibody Screen: POSITIVE
Unit division: 0
Unit division: 0
Unit division: 0
Unit division: 0
Unit division: 0
Unit division: 0
Unit division: 0
Unit division: 0

## 2024-01-13 LAB — BPAM RBC
Blood Product Expiration Date: 202507302359
Blood Product Expiration Date: 202507302359
Blood Product Expiration Date: 202507302359
Blood Product Expiration Date: 202508012359
Blood Product Expiration Date: 202508012359
Blood Product Expiration Date: 202508022359
Blood Product Expiration Date: 202508022359
Blood Product Expiration Date: 202508022359
ISSUE DATE / TIME: 202506291108
ISSUE DATE / TIME: 202506291108
ISSUE DATE / TIME: 202506291108
ISSUE DATE / TIME: 202506291108
ISSUE DATE / TIME: 202506300146
Unit Type and Rh: 5100
Unit Type and Rh: 5100
Unit Type and Rh: 5100
Unit Type and Rh: 5100
Unit Type and Rh: 5100
Unit Type and Rh: 5100
Unit Type and Rh: 5100
Unit Type and Rh: 5100

## 2024-01-17 ENCOUNTER — Other Ambulatory Visit: Payer: Self-pay

## 2024-01-17 ENCOUNTER — Ambulatory Visit: Payer: Self-pay | Admitting: Obstetrics and Gynecology

## 2024-01-17 VITALS — BP 136/87 | HR 108 | Wt 185.1 lb

## 2024-01-17 DIAGNOSIS — Z8751 Personal history of pre-term labor: Secondary | ICD-10-CM

## 2024-01-17 DIAGNOSIS — O09212 Supervision of pregnancy with history of pre-term labor, second trimester: Secondary | ICD-10-CM | POA: Diagnosis not present

## 2024-01-17 DIAGNOSIS — O42919 Preterm premature rupture of membranes, unspecified as to length of time between rupture and onset of labor, unspecified trimester: Secondary | ICD-10-CM

## 2024-01-17 DIAGNOSIS — D563 Thalassemia minor: Secondary | ICD-10-CM

## 2024-01-17 DIAGNOSIS — O0990 Supervision of high risk pregnancy, unspecified, unspecified trimester: Secondary | ICD-10-CM

## 2024-01-17 DIAGNOSIS — O361920 Maternal care for other isoimmunization, second trimester, not applicable or unspecified: Secondary | ICD-10-CM | POA: Diagnosis not present

## 2024-01-17 DIAGNOSIS — O99112 Other diseases of the blood and blood-forming organs and certain disorders involving the immune mechanism complicating pregnancy, second trimester: Secondary | ICD-10-CM

## 2024-01-17 DIAGNOSIS — Z3A22 22 weeks gestation of pregnancy: Secondary | ICD-10-CM | POA: Diagnosis not present

## 2024-01-17 DIAGNOSIS — O4102X0 Oligohydramnios, second trimester, not applicable or unspecified: Secondary | ICD-10-CM | POA: Diagnosis not present

## 2024-01-17 DIAGNOSIS — Z8679 Personal history of other diseases of the circulatory system: Secondary | ICD-10-CM

## 2024-01-17 NOTE — Progress Notes (Signed)
   PRENATAL VISIT NOTE  Subjective:  [name] is a 33 y.o. [phone] at [redacted]w[redacted]d being seen today for ongoing prenatal care.  She is currently monitored for the following issues for this high-risk pregnancy and has Supervision of high risk pregnancy, antepartum; History of preterm delivery; History of postpartum gestational hypertension; PPROM at 17/6 (December 17, 2023); Anhydramnios; Alpha thalassemia silent carrier; Trichomonal vaginitis in pregnancy in first trimester; Chlamydia trachomatis infection in pregnancy; Lewis isoimmunization during pregnancy; Preterm labor; Elevated AFP; Abdominal pain affecting pregnancy; and Constipation during pregnancy in second trimester on their problem list.  Patient doing well with no acute concerns today. She reports slight abdominal pain.  Contractions: Irritability. Vag. Bleeding: None.  Movement: Present. Denies leaking of fluid. (Pt with known low AFI)  Pt denies fever, chills and abdominal pain  The following portions of the patient's history were reviewed and updated as appropriate: allergies, current medications, past family history, past medical history, past social history, past surgical history and problem list. Problem list updated.  Objective:   Vitals:   01/17/24 1321  BP: 136/87  Pulse: (!) 108  Weight: 185 lb 1.6 oz (84 kg)    Fetal Status: Fetal Heart Rate (bpm): 155 Fundal Height: 23 cm Movement: Present     General:  Alert, oriented and cooperative. Patient is in no acute distress.  Skin: Skin is warm and dry. No rash noted.   Cardiovascular: Normal heart rate noted  Respiratory: Normal respiratory effort, no problems with respiration noted  Abdomen: Soft, gravid, appropriate for gestational age.  Pain/Pressure: Present (pressure)     Pelvic: Cervical exam deferred        Extremities: Normal range of motion.  Edema: None  Mental Status:  Normal mood and affect. Normal behavior. Normal judgment and thought content.   Assessment  and Plan:  Pregnancy: G4P2103 at [redacted]w[redacted]d  1. [redacted] weeks gestation of pregnancy (Primary)   2. Supervision of high risk pregnancy, antepartum See below  3. PPROM at 17/6 (December 17, 2023) Reviewed old notes and NICU consult.  Also spoke with MFM regarding plan.  Pt is to be admitted at 22 weeks ad 6 days, Friday, for steroids and latency abx.  Recheck with NICU and MFM upon admission.  Pulmonary hypoplasia is a definite concern due to prolonged time of rupture.  Pt to be admitted until delivery.  4. Lewis isoimmunization during pregnancy in second trimester, single or unspecified fetus   5. History of preterm delivery   6. History of postpartum gestational hypertension Monitor post delivery  7. Anhydramnios in second trimester, single or unspecified fetus See above with PROM and pulmonary hypoplasia concerns, see last neonatology note  8. Alpha thalassemia silent carrier   Preterm labor symptoms and general obstetric precautions including but not limited to vaginal bleeding, contractions, leaking of fluid and fetal movement were reviewed in detail with the patient.  Please refer to After Visit Summary for other counseling recommendations.   No follow-ups on file.   Jerilynn Buddle, MD Faculty Attending Center for [facility]

## 2024-01-19 DIAGNOSIS — Z3483 Encounter for supervision of other normal pregnancy, third trimester: Secondary | ICD-10-CM | POA: Diagnosis not present

## 2024-01-19 DIAGNOSIS — Z3482 Encounter for supervision of other normal pregnancy, second trimester: Secondary | ICD-10-CM | POA: Diagnosis not present

## 2024-01-20 ENCOUNTER — Encounter (HOSPITAL_COMMUNITY): Payer: Self-pay | Admitting: Obstetrics & Gynecology

## 2024-01-20 ENCOUNTER — Inpatient Hospital Stay (HOSPITAL_COMMUNITY)
Admission: AD | Admit: 2024-01-20 | Discharge: 2024-01-20 | Disposition: A | Discharge status: Home / Self Care | Payer: Self-pay | Source: Home / Self Care | Attending: Obstetrics & Gynecology | Admitting: Obstetrics & Gynecology

## 2024-01-20 DIAGNOSIS — O26892 Other specified pregnancy related conditions, second trimester: Secondary | ICD-10-CM

## 2024-01-20 DIAGNOSIS — Z3A22 22 weeks gestation of pregnancy: Secondary | ICD-10-CM

## 2024-01-20 DIAGNOSIS — O42919 Preterm premature rupture of membranes, unspecified as to length of time between rupture and onset of labor, unspecified trimester: Secondary | ICD-10-CM

## 2024-01-20 DIAGNOSIS — R519 Headache, unspecified: Secondary | ICD-10-CM

## 2024-01-20 DIAGNOSIS — O42912 Preterm premature rupture of membranes, unspecified as to length of time between rupture and onset of labor, second trimester: Secondary | ICD-10-CM | POA: Diagnosis not present

## 2024-01-20 LAB — URINALYSIS, ROUTINE W REFLEX MICROSCOPIC
Bilirubin Urine: NEGATIVE
Glucose, UA: NEGATIVE mg/dL
Hgb urine dipstick: NEGATIVE
Ketones, ur: NEGATIVE mg/dL
Leukocytes,Ua: NEGATIVE
Nitrite: NEGATIVE
Protein, ur: NEGATIVE mg/dL
Specific Gravity, Urine: 1.011 (ref 1.005–1.030)
pH: 7 (ref 5.0–8.0)

## 2024-01-20 LAB — COMPREHENSIVE METABOLIC PANEL WITH GFR
ALT: 27 U/L (ref 0–44)
AST: 32 U/L (ref 15–41)
Albumin: 2.9 g/dL — ABNORMAL LOW (ref 3.5–5.0)
Alkaline Phosphatase: 47 U/L (ref 38–126)
Anion gap: 11 (ref 5–15)
BUN: <5 mg/dL — ABNORMAL LOW (ref 6–20)
CO2: 21 mmol/L — ABNORMAL LOW (ref 22–32)
Calcium: 9.3 mg/dL (ref 8.9–10.3)
Chloride: 104 mmol/L (ref 98–111)
Creatinine, Ser: 0.55 mg/dL (ref 0.44–1.00)
GFR, Estimated: >60 mL/min (ref >60)
Glucose, Bld: 85 mg/dL (ref 70–99)
Potassium: 3.6 mmol/L (ref 3.5–5.1)
Sodium: 136 mmol/L (ref 135–145)
Total Bilirubin: 0.7 mg/dL (ref 0.0–1.2)
Total Protein: 6.6 g/dL (ref 6.5–8.1)

## 2024-01-20 LAB — WET PREP, GENITAL
Clue Cells Wet Prep HPF POC: NONE SEEN
Sperm: NONE SEEN
Trich, Wet Prep: NONE SEEN
WBC, Wet Prep HPF POC: >=10 — AB (ref <10)
Yeast Wet Prep HPF POC: NONE SEEN

## 2024-01-20 LAB — CBC
HCT: 36.2 % (ref 36.0–46.0)
Hemoglobin: 11.7 g/dL — ABNORMAL LOW (ref 12.0–15.0)
MCH: 26.2 pg (ref 26.0–34.0)
MCHC: 32.3 g/dL (ref 30.0–36.0)
MCV: 81.2 fL (ref 80.0–100.0)
Platelets: 300 K/uL (ref 150–400)
RBC: 4.46 MIL/uL (ref 3.87–5.11)
RDW: 25.1 % — ABNORMAL HIGH (ref 11.5–15.5)
WBC: 9.5 K/uL (ref 4.0–10.5)
nRBC: 0 % (ref 0.0–0.2)

## 2024-01-20 MED ORDER — DIPHENHYDRAMINE HCL 50 MG/ML IJ SOLN
25.0000 mg | Freq: Once | INTRAMUSCULAR | Status: AC
  Administered 2024-01-20: 25 mg via INTRAVENOUS
  Filled 2024-01-20: qty 1

## 2024-01-20 MED ORDER — LACTATED RINGERS IV BOLUS
1000.0000 mL | Freq: Once | INTRAVENOUS | Status: AC
  Administered 2024-01-20: 1000 mL via INTRAVENOUS

## 2024-01-20 MED ORDER — METOCLOPRAMIDE HCL 5 MG/ML IJ SOLN
10.0000 mg | Freq: Once | INTRAMUSCULAR | Status: AC
  Administered 2024-01-20: 10 mg via INTRAVENOUS
  Filled 2024-01-20: qty 2

## 2024-01-20 MED ORDER — ACETAMINOPHEN-CAFFEINE 500-65 MG PO TABS
2.0000 | ORAL_TABLET | Freq: Once | ORAL | Status: AC
  Administered 2024-01-20: 2 via ORAL
  Filled 2024-01-20: qty 2

## 2024-01-20 NOTE — MAU Note (Signed)
 [name] is a 33 y.o. at [redacted]w[redacted]d here in MAU reporting: HX PPROM at 17 week. Will be admitted tomorrow to [facility]. Started having lowe abd pain and pressure about 3 hours ago. Having some spotting.C/O mild headache and a feeling a little dizzy. Ate breakfast to see it it would help but still not any better.   LMP:  Onset of complaint: 3 hours Pain score: Abd 7-8 Head-6 Vitals:   01/20/24 0830  BP: 109/78  Pulse: (!) 122  Resp: 18  Temp: 98.4 F (36.9 C)     FHT: 154  Lab orders placed from triage: u/a

## 2024-01-20 NOTE — Discharge Instructions (Signed)
 Return tomorrow after 10 AM for Admission to Antepartum Unit . Go to the MAU registration Desk

## 2024-01-20 NOTE — MAU Provider Note (Signed)
 Chief Complaint:  Headache and Abdominal Pain   HPI      [name] is a 33 y.o. [phone] at [redacted]w[redacted]d who presents to maternity admissions reporting a severe HA that started around 0400. She reports she only took Flexeril  for the HA with no resolve and reports 10/10 on pain scale. She reports photophobia, phonophobia and mild N/V with the HA but she was able to eat this AM after taking some phenergan .   She reports she is also having some scant vaginal spotting with lower abdominal cramping and pelvic pressure that started about 3 hours ago. She is currently 22.5 GA with known PPROM at 17 weeks and is scheduled for OBS admission tomorrow at 22.6 GA Friday, for steroids and latency abx.   Pregnancy Course: [facility]   Past Medical History:  Diagnosis Date   Abdominal trauma 10/21/2011   Anemia    Blood transfusion    02-01-2009 s/p SVD   Chronic back pain    Dental caries 10/22/2011   Dental referral  IMO SNOMED Dx Update Oct 2024     Depression    not on meds   Encounter for Depo-Provera  contraception 06/15/2012   GERD (gastroesophageal reflux disease)    High-risk pregnancy supervision 10/22/2011   History of preterm delivery, currently pregnant 10/22/2011   Premature dilation and effacement, Rx Prometrium       Mental disorder    NSVD (normal spontaneous vaginal delivery) 12/11/2011   Pelvic pain in female 06/15/2012   Rt aadnexal tenderness ? Thickening> pelvic US      Pregnancy induced hypertension 07/13/2008   required Mag Sulfate   Preterm labor    first pregnancy/current pregnancy   UTI (urinary tract infection) in pregnancy, antepartum    09/2011 tx with abx   OB History  Gravida Para Term Preterm AB Living  4 3 2 1  0 3  SAB IAB Ectopic Multiple Live Births  0 0 0 0 3    # Outcome Date GA Lbr Len/2nd Weight Sex Type Anes PTL Lv  4 Current           3 Term 12/11/11 [redacted]w[redacted]d 273:55 / 00:09 3465 g M Vag-Spont None  LIV  2 Preterm 04/08/10 [redacted]w[redacted]d   M Vag-Spont   LIV      Birth Comments: PPROM at 76 wk,+GBS, IOL at 43 wk  1 Term 10/16/08 [redacted]w[redacted]d   F Vag-Spont   LIV   Past Surgical History:  Procedure Laterality Date   FINGER SURGERY     right thumb in childhood   Family History  Problem Relation Age of Onset   Asthma Mother    Diabetes Father    Hypertension Father    Heart disease Father        afib, died 02-02-2020   Mental illness Brother        bipolar, schizophrenia   Anesthesia problems Neg Hx    Social History   Tobacco Use   Smoking status: Some Days    Types: Cigarettes   Smokeless tobacco: Never  Vaping Use   Vaping status: Never Used  Substance Use Topics   Alcohol use: Not Currently    Comment: occ   Drug use: Not Currently   No Known Allergies Medications Prior to Admission  Medication Sig Dispense Refill Last Dose/Taking   acetaminophen  (TYLENOL ) 325 MG tablet Take 2 tablets (650 mg total) by mouth every 6 (six) hours as needed.   Past Week   aspirin  EC 81 MG  tablet Take 2 tablets (162 mg total) by mouth daily. Swallow whole. 60 tablet 12 01/20/2024 Morning   cyclobenzaprine  (FLEXERIL ) 10 MG tablet Take 1 tablet (10 mg total) by mouth every 8 (eight) hours as needed for muscle spasms. 30 tablet 0 01/20/2024 Morning   docusate sodium  (COLACE) 50 MG capsule Take 50 mg by mouth 2 (two) times daily.   01/20/2024 Morning   ferrous sulfate  325 (65 FE) MG tablet TAKE 1 TABLET BY MOUTH EVERY DAY 30 tablet 0 01/20/2024 Morning   polyethylene glycol (MIRALAX ) 17 g packet Take 17 g by mouth every 3 (three) days. 14 each 0 Past Week   Prenatal Vit-Fe Fumarate-FA (PRENATAL MULTIVITAMIN) TABS tablet Take 1 tablet by mouth daily at 12 noon. 30 tablet 0 01/20/2024 Morning   promethazine  (PHENERGAN ) 25 MG tablet Take 1 tablet (25 mg total) by mouth every 6 (six) hours as needed. 40 tablet 0 01/20/2024 Morning    I have reviewed patient's Past Medical Hx, Surgical Hx, Family Hx, Social Hx, medications and allergies.   ROS  Pertinent items noted in HPI  and remainder of comprehensive ROS otherwise negative.   PHYSICAL EXAM  Patient Vitals for the past 24 hrs:  BP Temp Pulse Resp Height Weight  01/20/24 0852 110/67 -- (!) 105 20 -- --  01/20/24 0830 109/78 98.4 F (36.9 C) (!) 122 18 5' 7 (1.702 m) 83.3 kg    Constitutional: Well-developed, well-nourished female who appears uncomfortable with HA Cardiovascular: tachycardia ,warm and well-perfused Respiratory: normal effort, no problems with respiration noted GI: Abd soft, non-tender, gravid MS: Extremities nontender, no edema, normal ROM Neurologic: Alert and oriented x 4.  GU: no CVA tenderness Pelvic: Exam chaperoned by Jeoffrey Ade RN No pooling, no evidence of vaginal bleeding, mucoid discharge present, cervix visually is closed      Fetal Tracing: Via Doppler 154  Labs: Results for orders placed or performed during the hospital encounter of 01/20/24 (from the past 24 hours)  Urinalysis, Routine w reflex microscopic -Urine, Clean Catch     Status: None   Collection Time: 01/20/24  8:57 AM  Result Value Ref Range   Color, Urine YELLOW YELLOW   APPearance CLEAR CLEAR   Specific Gravity, Urine 1.011 1.005 - 1.030   pH 7.0 5.0 - 8.0   Glucose, UA NEGATIVE NEGATIVE mg/dL   Hgb urine dipstick NEGATIVE NEGATIVE   Bilirubin Urine NEGATIVE NEGATIVE   Ketones, ur NEGATIVE NEGATIVE mg/dL   Protein, ur NEGATIVE NEGATIVE mg/dL   Nitrite NEGATIVE NEGATIVE   Leukocytes,Ua NEGATIVE NEGATIVE  Wet prep, genital     Status: Abnormal   Collection Time: 01/20/24  9:08 AM  Result Value Ref Range   Yeast Wet Prep HPF POC NONE SEEN NONE SEEN   Trich, Wet Prep NONE SEEN NONE SEEN   Clue Cells Wet Prep HPF POC NONE SEEN NONE SEEN   WBC, Wet Prep HPF POC >=10 (A) <10   Sperm NONE SEEN   CBC     Status: Abnormal   Collection Time: 01/20/24  9:44 AM  Result Value Ref Range   WBC 9.5 4.0 - 10.5 K/uL   RBC 4.46 3.87 - 5.11 MIL/uL   Hemoglobin 11.7 (L) 12.0 - 15.0 g/dL   HCT 63.7 63.9 -  53.9 %   MCV 81.2 80.0 - 100.0 fL   MCH 26.2 26.0 - 34.0 pg   MCHC 32.3 30.0 - 36.0 g/dL   RDW 74.8 (H) 88.4 - 84.4 %   Platelets  300 150 - 400 K/uL   nRBC 0.0 0.0 - 0.2 %  Comprehensive metabolic panel     Status: Abnormal   Collection Time: 01/20/24  9:44 AM  Result Value Ref Range   Sodium 136 135 - 145 mmol/L   Potassium 3.6 3.5 - 5.1 mmol/L   Chloride 104 98 - 111 mmol/L   CO2 21 (L) 22 - 32 mmol/L   Glucose, Bld 85 70 - 99 mg/dL   BUN <5 (L) 6 - 20 mg/dL   Creatinine, Ser 9.44 0.44 - 1.00 mg/dL   Calcium  9.3 8.9 - 10.3 mg/dL   Total Protein 6.6 6.5 - 8.1 g/dL   Albumin 2.9 (L) 3.5 - 5.0 g/dL   AST 32 15 - 41 U/L   ALT 27 0 - 44 U/L   Alkaline Phosphatase 47 38 - 126 U/L   Total Bilirubin 0.7 0.0 - 1.2 mg/dL   GFR, Estimated >39 >39 mL/min   Anion gap 11 5 - 15    Imaging:  No results found.  MDM & MAU COURSE  MDM:  HIGH  HA in Pregnancy  Known H/O PPROM at 17 weeks, now 22w 5d pregnant  Prenatal records reviewed Physical exam performed with SSE  ( Cultures sent for h/o recurrent trichomonas and vaginal infections) CBC,CMP - R/O infection due to prolonged ROM and check for electrolyte imbalances that might be contributing to her HA IVF for HA IV Migraine cocktail ( Reglan , Benadryl ) and Excedrin   Patient reassessed @ 1032 and reports her HA is now 1/10  Labs unremarkable, CMP and GC pending at discharge low suspicion for electrolyte abnormalities given her improvement in symptoms. Will plan for discharge   MAU Course: Orders Placed This Encounter  Procedures   Wet prep, genital   Urinalysis, Routine w reflex microscopic -Urine, Clean Catch   CBC   Comprehensive metabolic panel   Discharge patient Discharge disposition: 01-Home or Self Care; Discharge patient date: 01/20/2024   Meds ordered this encounter  Medications   lactated ringers  bolus 1,000 mL   metoCLOPramide  (REGLAN ) injection 10 mg   diphenhydrAMINE  (BENADRYL ) injection 25 mg    acetaminophen -caffeine  (EXCEDRIN TENSION HEADACHE) 500-65 MG per tablet 2 tablet    I have reviewed the patient chart and performed the physical exam . I have ordered & interpreted the lab results and reviewed them with the patient  Medications ordered as stated above.  A/P as described below.  Counseling and education provided and patient agreeable  with plan as described below. Verbalized understanding.    ASSESSMENT   1. Nonintractable headache, unspecified chronicity pattern, unspecified headache type   2. PPROM at 17/6 (December 17, 2023)   3. [redacted] weeks gestation of pregnancy     PLAN  Discharge home in stable condition with return precautions.   Return tomorrow after 10 AM for Admission to Antepartum Unit . Go to the MAU registration Desk  See AVS for full description of information given to the patient including both verbal and written. Patient verbalized understanding and agrees with the plan as described above.      Allergies as of 01/20/2024   No Known Allergies      Medication List     TAKE these medications    acetaminophen  325 MG tablet Commonly known as: Tylenol  Take 2 tablets (650 mg total) by mouth every 6 (six) hours as needed.   aspirin  EC 81 MG tablet Take 2 tablets (162 mg total) by mouth daily. Swallow whole.  cyclobenzaprine  10 MG tablet Commonly known as: FLEXERIL  Take 1 tablet (10 mg total) by mouth every 8 (eight) hours as needed for muscle spasms.   docusate sodium  50 MG capsule Commonly known as: COLACE Take 50 mg by mouth 2 (two) times daily.   ferrous sulfate  325 (65 FE) MG tablet TAKE 1 TABLET BY MOUTH EVERY DAY   polyethylene glycol 17 g packet Commonly known as: MiraLax  Take 17 g by mouth every 3 (three) days.   Prenatal 27-1 MG Tabs Take 1 tablet by mouth daily at 12 noon.   promethazine  25 MG tablet Commonly known as: PHENERGAN  Take 1 tablet (25 mg total) by mouth every 6 (six) hours as needed.        Olam Dalton, MSN,  [facility], Center for [employer]

## 2024-01-21 ENCOUNTER — Encounter (HOSPITAL_COMMUNITY): Payer: Self-pay | Admitting: Obstetrics & Gynecology

## 2024-01-21 ENCOUNTER — Other Ambulatory Visit: Payer: Self-pay

## 2024-01-21 ENCOUNTER — Inpatient Hospital Stay (HOSPITAL_COMMUNITY)
Admission: AD | Admit: 2024-01-21 | Discharge: 2024-01-28 | DRG: 786 | Disposition: A | Discharge status: Home / Self Care | Attending: Obstetrics and Gynecology | Admitting: Obstetrics and Gynecology

## 2024-01-21 DIAGNOSIS — O99334 Smoking (tobacco) complicating childbirth: Secondary | ICD-10-CM | POA: Diagnosis not present

## 2024-01-21 DIAGNOSIS — F17210 Nicotine dependence, cigarettes, uncomplicated: Secondary | ICD-10-CM | POA: Diagnosis not present

## 2024-01-21 DIAGNOSIS — Z5986 Financial insecurity: Secondary | ICD-10-CM | POA: Diagnosis not present

## 2024-01-21 DIAGNOSIS — Q336 Congenital hypoplasia and dysplasia of lung: Secondary | ICD-10-CM | POA: Diagnosis not present

## 2024-01-21 DIAGNOSIS — O4102X0 Oligohydramnios, second trimester, not applicable or unspecified: Secondary | ICD-10-CM

## 2024-01-21 DIAGNOSIS — O99344 Other mental disorders complicating childbirth: Secondary | ICD-10-CM | POA: Diagnosis present

## 2024-01-21 DIAGNOSIS — Z452 Encounter for adjustment and management of vascular access device: Secondary | ICD-10-CM | POA: Diagnosis not present

## 2024-01-21 DIAGNOSIS — F4321 Adjustment disorder with depressed mood: Secondary | ICD-10-CM | POA: Diagnosis not present

## 2024-01-21 DIAGNOSIS — Z555 Less than a high school diploma: Secondary | ICD-10-CM | POA: Diagnosis not present

## 2024-01-21 DIAGNOSIS — D563 Thalassemia minor: Secondary | ICD-10-CM | POA: Diagnosis present

## 2024-01-21 DIAGNOSIS — E2740 Unspecified adrenocortical insufficiency: Secondary | ICD-10-CM | POA: Diagnosis not present

## 2024-01-21 DIAGNOSIS — Z148 Genetic carrier of other disease: Secondary | ICD-10-CM | POA: Diagnosis not present

## 2024-01-21 DIAGNOSIS — Z3A22 22 weeks gestation of pregnancy: Secondary | ICD-10-CM

## 2024-01-21 DIAGNOSIS — O09212 Supervision of pregnancy with history of pre-term labor, second trimester: Secondary | ICD-10-CM | POA: Diagnosis not present

## 2024-01-21 DIAGNOSIS — K5900 Constipation, unspecified: Secondary | ICD-10-CM | POA: Diagnosis present

## 2024-01-21 DIAGNOSIS — O42112 Preterm premature rupture of membranes, onset of labor more than 24 hours following rupture, second trimester: Principal | ICD-10-CM

## 2024-01-21 DIAGNOSIS — O42013 Preterm premature rupture of membranes, onset of labor within 24 hours of rupture, third trimester: Secondary | ICD-10-CM | POA: Diagnosis not present

## 2024-01-21 DIAGNOSIS — A568 Sexually transmitted chlamydial infection of other sites: Secondary | ICD-10-CM | POA: Diagnosis present

## 2024-01-21 DIAGNOSIS — Z8249 Family history of ischemic heart disease and other diseases of the circulatory system: Secondary | ICD-10-CM

## 2024-01-21 DIAGNOSIS — Z8679 Personal history of other diseases of the circulatory system: Secondary | ICD-10-CM

## 2024-01-21 DIAGNOSIS — I959 Hypotension, unspecified: Secondary | ICD-10-CM | POA: Diagnosis not present

## 2024-01-21 DIAGNOSIS — Z7982 Long term (current) use of aspirin: Secondary | ICD-10-CM

## 2024-01-21 DIAGNOSIS — O411230 Chorioamnionitis, third trimester, not applicable or unspecified: Secondary | ICD-10-CM | POA: Diagnosis not present

## 2024-01-21 DIAGNOSIS — O9962 Diseases of the digestive system complicating childbirth: Secondary | ICD-10-CM | POA: Diagnosis not present

## 2024-01-21 DIAGNOSIS — F419 Anxiety disorder, unspecified: Secondary | ICD-10-CM | POA: Diagnosis present

## 2024-01-21 DIAGNOSIS — F32A Depression, unspecified: Secondary | ICD-10-CM | POA: Diagnosis present

## 2024-01-21 DIAGNOSIS — K219 Gastro-esophageal reflux disease without esophagitis: Secondary | ICD-10-CM | POA: Diagnosis not present

## 2024-01-21 DIAGNOSIS — Z3A23 23 weeks gestation of pregnancy: Secondary | ICD-10-CM | POA: Diagnosis not present

## 2024-01-21 DIAGNOSIS — Z833 Family history of diabetes mellitus: Secondary | ICD-10-CM | POA: Diagnosis not present

## 2024-01-21 DIAGNOSIS — O42912 Preterm premature rupture of membranes, unspecified as to length of time between rupture and onset of labor, second trimester: Principal | ICD-10-CM | POA: Diagnosis present

## 2024-01-21 DIAGNOSIS — Z8759 Personal history of other complications of pregnancy, childbirth and the puerperium: Secondary | ICD-10-CM

## 2024-01-21 DIAGNOSIS — A5901 Trichomonal vulvovaginitis: Secondary | ICD-10-CM | POA: Diagnosis present

## 2024-01-21 DIAGNOSIS — O0992 Supervision of high risk pregnancy, unspecified, second trimester: Secondary | ICD-10-CM

## 2024-01-21 DIAGNOSIS — O411220 Chorioamnionitis, second trimester, not applicable or unspecified: Secondary | ICD-10-CM | POA: Diagnosis not present

## 2024-01-21 DIAGNOSIS — O321XX0 Maternal care for breech presentation, not applicable or unspecified: Secondary | ICD-10-CM | POA: Diagnosis not present

## 2024-01-21 DIAGNOSIS — O99612 Diseases of the digestive system complicating pregnancy, second trimester: Secondary | ICD-10-CM | POA: Diagnosis present

## 2024-01-21 DIAGNOSIS — O0990 Supervision of high risk pregnancy, unspecified, unspecified trimester: Secondary | ICD-10-CM

## 2024-01-21 DIAGNOSIS — O43812 Placental infarction, second trimester: Secondary | ICD-10-CM | POA: Diagnosis not present

## 2024-01-21 DIAGNOSIS — Z8751 Personal history of pre-term labor: Secondary | ICD-10-CM

## 2024-01-21 DIAGNOSIS — R772 Abnormality of alphafetoprotein: Secondary | ICD-10-CM | POA: Diagnosis present

## 2024-01-21 DIAGNOSIS — O09299 Supervision of pregnancy with other poor reproductive or obstetric history, unspecified trimester: Secondary | ICD-10-CM

## 2024-01-21 DIAGNOSIS — O365920 Maternal care for other known or suspected poor fetal growth, second trimester, not applicable or unspecified: Secondary | ICD-10-CM | POA: Diagnosis present

## 2024-01-21 DIAGNOSIS — O361990 Maternal care for other isoimmunization, unspecified trimester, not applicable or unspecified: Secondary | ICD-10-CM | POA: Diagnosis present

## 2024-01-21 DIAGNOSIS — Z4682 Encounter for fitting and adjustment of non-vascular catheter: Secondary | ICD-10-CM | POA: Diagnosis not present

## 2024-01-21 DIAGNOSIS — O4702 False labor before 37 completed weeks of gestation, second trimester: Secondary | ICD-10-CM | POA: Diagnosis not present

## 2024-01-21 DIAGNOSIS — Z98891 History of uterine scar from previous surgery: Principal | ICD-10-CM

## 2024-01-21 DIAGNOSIS — O165 Unspecified maternal hypertension, complicating the puerperium: Secondary | ICD-10-CM | POA: Diagnosis not present

## 2024-01-21 DIAGNOSIS — R140 Abdominal distension (gaseous): Secondary | ICD-10-CM | POA: Diagnosis not present

## 2024-01-21 DIAGNOSIS — O135 Gestational [pregnancy-induced] hypertension without significant proteinuria, complicating the puerperium: Secondary | ICD-10-CM | POA: Diagnosis not present

## 2024-01-21 DIAGNOSIS — O98319 Other infections with a predominantly sexual mode of transmission complicating pregnancy, unspecified trimester: Secondary | ICD-10-CM | POA: Diagnosis present

## 2024-01-21 HISTORY — DX: Personal history of diseases of the blood and blood-forming organs and certain disorders involving the immune mechanism: Z87.59

## 2024-01-21 HISTORY — DX: Personal history of diseases of the blood and blood-forming organs and certain disorders involving the immune mechanism: Z86.2

## 2024-01-21 LAB — GC/CHLAMYDIA PROBE AMP (~~LOC~~) NOT AT ARMC
Chlamydia: NEGATIVE
Comment: NEGATIVE
Comment: NORMAL
Neisseria Gonorrhea: NEGATIVE

## 2024-01-21 LAB — CBC WITH DIFFERENTIAL/PLATELET
Abs Immature Granulocytes: 0 K/uL (ref 0.00–0.07)
Basophils Absolute: 0.2 K/uL — ABNORMAL HIGH (ref 0.0–0.1)
Basophils Relative: 2 %
Eosinophils Absolute: 0.1 K/uL (ref 0.0–0.5)
Eosinophils Relative: 1 %
HCT: 34.7 % — ABNORMAL LOW (ref 36.0–46.0)
Hemoglobin: 11.2 g/dL — ABNORMAL LOW (ref 12.0–15.0)
Lymphocytes Relative: 36 %
Lymphs Abs: 3.1 K/uL (ref 0.7–4.0)
MCH: 26.4 pg (ref 26.0–34.0)
MCHC: 32.3 g/dL (ref 30.0–36.0)
MCV: 81.8 fL (ref 80.0–100.0)
Monocytes Absolute: 0.3 K/uL (ref 0.1–1.0)
Monocytes Relative: 3 %
Neutro Abs: 4.9 K/uL (ref 1.7–7.7)
Neutrophils Relative %: 58 %
Platelets: 309 K/uL (ref 150–400)
RBC: 4.24 MIL/uL (ref 3.87–5.11)
RDW: 25 % — ABNORMAL HIGH (ref 11.5–15.5)
WBC: 8.5 K/uL (ref 4.0–10.5)
nRBC: 0 /100{WBCs}
nRBC: 0.2 % (ref 0.0–0.2)

## 2024-01-21 LAB — COMPREHENSIVE METABOLIC PANEL WITH GFR
ALT: 27 U/L (ref 0–44)
AST: 25 U/L (ref 15–41)
Albumin: 2.9 g/dL — ABNORMAL LOW (ref 3.5–5.0)
Alkaline Phosphatase: 46 U/L (ref 38–126)
Anion gap: 10 (ref 5–15)
BUN: <5 mg/dL — ABNORMAL LOW (ref 6–20)
CO2: 20 mmol/L — ABNORMAL LOW (ref 22–32)
Calcium: 9.3 mg/dL (ref 8.9–10.3)
Chloride: 106 mmol/L (ref 98–111)
Creatinine, Ser: 0.56 mg/dL (ref 0.44–1.00)
GFR, Estimated: >60 mL/min (ref >60)
Glucose, Bld: 74 mg/dL (ref 70–99)
Potassium: 3.7 mmol/L (ref 3.5–5.1)
Sodium: 136 mmol/L (ref 135–145)
Total Bilirubin: 0.7 mg/dL (ref 0.0–1.2)
Total Protein: 6.5 g/dL (ref 6.5–8.1)

## 2024-01-21 MED ORDER — AZITHROMYCIN 250 MG PO TABS
1000.0000 mg | ORAL_TABLET | Freq: Once | ORAL | Status: AC
  Administered 2024-01-21: 1000 mg via ORAL
  Filled 2024-01-21: qty 4

## 2024-01-21 MED ORDER — POLYETHYLENE GLYCOL 3350 17 G PO PACK
17.0000 g | PACK | ORAL | Status: DC
  Administered 2024-01-21 – 2024-01-23 (×2): 17 g via ORAL
  Filled 2024-01-21 (×4): qty 1

## 2024-01-21 MED ORDER — ACETAMINOPHEN 325 MG PO TABS
650.0000 mg | ORAL_TABLET | ORAL | Status: DC | PRN
  Administered 2024-01-23 – 2024-01-25 (×4): 650 mg via ORAL
  Filled 2024-01-21 (×6): qty 2

## 2024-01-21 MED ORDER — AMOXICILLIN 500 MG PO CAPS
500.0000 mg | ORAL_CAPSULE | Freq: Three times a day (TID) | ORAL | Status: DC
  Administered 2024-01-23 – 2024-01-25 (×7): 500 mg via ORAL
  Filled 2024-01-21 (×8): qty 1

## 2024-01-21 MED ORDER — DOCUSATE SODIUM 100 MG PO CAPS
100.0000 mg | ORAL_CAPSULE | Freq: Every day | ORAL | Status: DC
  Administered 2024-01-22 – 2024-01-25 (×4): 100 mg via ORAL
  Filled 2024-01-21 (×4): qty 1

## 2024-01-21 MED ORDER — SODIUM CHLORIDE 0.9% FLUSH
3.0000 mL | INTRAVENOUS | Status: DC | PRN
  Administered 2024-01-22: 10 mL via INTRAVENOUS

## 2024-01-21 MED ORDER — FERROUS SULFATE 325 (65 FE) MG PO TABS
325.0000 mg | ORAL_TABLET | Freq: Every day | ORAL | Status: DC
  Administered 2024-01-22 – 2024-01-25 (×4): 325 mg via ORAL
  Filled 2024-01-21 (×4): qty 1

## 2024-01-21 MED ORDER — ZOLPIDEM TARTRATE 5 MG PO TABS
5.0000 mg | ORAL_TABLET | Freq: Every evening | ORAL | Status: DC | PRN
  Administered 2024-01-21 – 2024-01-24 (×4): 5 mg via ORAL
  Filled 2024-01-21 (×5): qty 1

## 2024-01-21 MED ORDER — PRENATAL MULTIVITAMIN CH
1.0000 | ORAL_TABLET | Freq: Every day | ORAL | Status: DC
  Administered 2024-01-22 – 2024-01-25 (×4): 1 via ORAL
  Filled 2024-01-21 (×5): qty 1

## 2024-01-21 MED ORDER — ASPIRIN 81 MG PO TBEC
162.0000 mg | DELAYED_RELEASE_TABLET | Freq: Every day | ORAL | Status: DC
  Administered 2024-01-22 – 2024-01-25 (×4): 162 mg via ORAL
  Filled 2024-01-21 (×4): qty 2

## 2024-01-21 MED ORDER — CYCLOBENZAPRINE HCL 10 MG PO TABS
10.0000 mg | ORAL_TABLET | Freq: Three times a day (TID) | ORAL | Status: DC | PRN
  Administered 2024-01-22 – 2024-01-25 (×4): 10 mg via ORAL
  Filled 2024-01-21 (×5): qty 1

## 2024-01-21 MED ORDER — ONDANSETRON 4 MG PO TBDP
4.0000 mg | ORAL_TABLET | Freq: Four times a day (QID) | ORAL | Status: DC | PRN
  Administered 2024-01-21 – 2024-01-27 (×6): 4 mg via ORAL
  Filled 2024-01-21 (×7): qty 1

## 2024-01-21 MED ORDER — CALCIUM CARBONATE ANTACID 500 MG PO CHEW
2.0000 | CHEWABLE_TABLET | ORAL | Status: DC | PRN
  Administered 2024-01-23 – 2024-01-24 (×3): 400 mg via ORAL
  Filled 2024-01-21 (×3): qty 2

## 2024-01-21 MED ORDER — BETAMETHASONE SOD PHOS & ACET 6 (3-3) MG/ML IJ SUSP
12.0000 mg | INTRAMUSCULAR | Status: AC
  Administered 2024-01-21 – 2024-01-22 (×2): 12 mg via INTRAMUSCULAR
  Filled 2024-01-21: qty 5

## 2024-01-21 MED ORDER — SODIUM CHLORIDE 0.9 % IV SOLN
2.0000 g | Freq: Four times a day (QID) | INTRAVENOUS | Status: AC
  Administered 2024-01-21 – 2024-01-23 (×8): 2 g via INTRAVENOUS
  Filled 2024-01-21 (×8): qty 2000

## 2024-01-21 MED ORDER — SODIUM CHLORIDE 0.9% FLUSH
3.0000 mL | Freq: Two times a day (BID) | INTRAVENOUS | Status: DC
  Administered 2024-01-21 – 2024-01-25 (×8): 10 mL via INTRAVENOUS

## 2024-01-21 NOTE — Consult Note (Signed)
 [facility]  Prenatal Consult      01/21/2024   1:06 PM  I was asked by Dr. Herchel to consult on this patient for possible preterm delivery. I had the pleasure of meeting with [name] today.   She is a 33 yo G17P2103 female presenting at [redacted]w[redacted]d IUP with concerns for PPROM. Pregnancy has also been complicated by PPROM at [redacted]w[redacted]d, and is now being admitted for monitoring, BMZ, and latency antibiotics. No signs of threatened immediate preterm delivery at this time. Received her first dose of BMZ today at 1222. Most recent estimated fetal weight of 338 grams on 6/29 ([redacted]w[redacted]d). She is a having a baby boy.   She met with Dr. Othella on 6/19 during her previous admission, and based on that discussion voiced that she had a good understanding of what to expect in the delivery room and during her baby's potential NICU course. She denied having any further questions, and declined further discussion about  her baby's potential risks or care at this time. She continues to desire full resuscitative efforts and a trial of intensive care. I affirmed that we will continue to follow her course, and the neonatal intensive care team would be present for the delivery. I also encouraged her to ask for a member of our team to come speak with her anytime during her admission if she has questions, and she voiced appreciation.  Thank you for involving us  in the care of this patient. A member of our team will be available should the family have additional questions.   Alyce Kirsch, MD Neonatal Medicine

## 2024-01-21 NOTE — Consult Note (Signed)
 MFM consultation (telemedicine)  I connected with  [name] on 01/21/24 by a video enabled telemedicine application and verified that I am speaking with the correct person using two identifiers.   I discussed the limitations of evaluation and management by telemedicine. The patient expressed understanding and agreed to proceed.   I was asked by Dr. Gloris Hugger to review management and delivery for [name] who being admitted at 75 w 6 day with an EDD of 05/20/24 s/p PPROM at 17 wks 6 d.  She is doing well today without complaints of vaginal bleeding, uterine contractions, fever, chills or abdominal pain.  She has been doing well at home since 57 w 6 d w/o s/sx of infection.    Her pregnancy is complicated by the following issues: 1) elevated AFP 5 MoM  2) PPROM with oligohydramnios She is receiving BMZ and latency antibiotics.  She has been counseling by NICU and understands the risk related to PTD.  3) prior PPROM with PTD.     01/21/2024   12:41 PM 01/21/2024   10:10 AM 01/20/2024   10:21 AM  Vitals with BMI  Systolic 117 133 884  Diastolic 73 83 58  Pulse 83 104 100       Latest Ref Rng & Units 01/21/2024   12:01 PM 01/20/2024    9:44 AM 01/09/2024    7:56 AM  CBC  WBC 4.0 - 10.5 K/uL 8.5  9.5  9.8   Hemoglobin 12.0 - 15.0 g/dL 88.7  88.2  88.3   Hematocrit 36.0 - 46.0 % 34.7  36.2  36.8   Platelets 150 - 400 K/uL 309  300  390       Latest Ref Rng & Units 01/20/2024    9:44 AM 01/09/2024    7:56 AM 01/08/2024   11:36 AM  CMP  Glucose 70 - 99 mg/dL 85  78  79   BUN 6 - 20 mg/dL <5  6  <5   Creatinine 0.44 - 1.00 mg/dL 9.44  9.44  9.45   Sodium 135 - 145 mmol/L 136  136  134   Potassium 3.5 - 5.1 mmol/L 3.6  4.4  3.9   Chloride 98 - 111 mmol/L 104  105  105   CO2 22 - 32 mmol/L 21  19  21    Calcium  8.9 - 10.3 mg/dL 9.3  9.3  8.8   Total Protein 6.5 - 8.1 g/dL 6.6  6.8  6.4   Total Bilirubin 0.0 - 1.2 mg/dL 0.7  0.7  0.6   Alkaline Phos 38 - 126 U/L  47  48  46   AST 15 - 41 U/L 32  29  15   ALT 0 - 44 U/L 27  16  12     OB History  Gravida Para Term Preterm AB Living  4 3 2 1  0 3  SAB IAB Ectopic Multiple Live Births  0 0 0 0 3    # Outcome Date GA Lbr Len/2nd Weight Sex Type Anes PTL Lv  4 Current           3 Term 12/11/11 [redacted]w[redacted]d 273:55 / 00:09 3465 g M Vag-Spont None  LIV     Name: [name]     Apgar1: 8  Apgar5: 9  2 Preterm 04/08/10 [redacted]w[redacted]d   M Vag-Spont   LIV     Birth Comments: PPROM at 10 wk,+GBS, IOL at 34 wk  1 Term 10/16/08 [redacted]w[redacted]d   F  Vag-Spont   LIV     Birth Comments: Had preeclampsia, required magnesium  sulfate     Complications: Preeclampsia   . Past Medical History:  Diagnosis Date   Abdominal trauma 10/21/2011   Blood transfusion    2010 s/p SVD   Chlamydia trachomatis infection in pregnancy 12/17/2023   Chronic back pain    Dental caries 10/22/2011   Depression    not on meds   GERD (gastroesophageal reflux disease)    High-risk pregnancy supervision 10/22/2011   History of postpartum anemia    Mental disorder    Pelvic pain in female 06/15/2012   Right adnexal tenderness, negative ultrasound   Preeclampsia 07/13/2008   required Mag Sulfate   Preterm labor and delivery 04/08/2010   G2 pregnancy, occured at 34 weeks   Trichomonal vaginitis in pregnancy in first trimester 12/17/2023   UTI (urinary tract infection) in pregnancy, antepartum    09/2011 tx with abx   Past Surgical History:  Procedure Laterality Date   FINGER SURGERY     right thumb in childhood   Social History   Socioeconomic History   Marital status: Single    Spouse name: Not on file   Number of children: Not on file   Years of education: Not on file   Highest education level: 10th grade  Occupational History   Not on file  Tobacco Use   Smoking status: Some Days    Types: Cigarettes   Smokeless tobacco: Never  Vaping Use   Vaping status: Never Used  Substance and Sexual Activity   Alcohol use: Not Currently     Comment: occ   Drug use: Not Currently   Sexual activity: Not Currently    Birth control/protection: None  Other Topics Concern   Not on file  Social History Narrative   Not on file   Social Drivers of Health   Financial Resource Strain: Low Risk  (01/10/2024)   Overall Financial Resource Strain (CARDIA)    Difficulty of Paying Living Expenses: Not very hard  Recent Concern: Physicist, medical Strain - High Risk (11/24/2023)   Overall Financial Resource Strain (CARDIA)    Difficulty of Paying Living Expenses: Very hard  Food Insecurity: No Food Insecurity (01/21/2024)   Hunger Vital Sign    Worried About Running Out of Food in the Last Year: Never true    Ran Out of Food in the Last Year: Never true  Recent Concern: Food Insecurity - Food Insecurity Present (01/10/2024)   Hunger Vital Sign    Worried About Running Out of Food in the Last Year: Sometimes true    Ran Out of Food in the Last Year: Sometimes true  Transportation Needs: No Transportation Needs (01/21/2024)   PRAPARE - Administrator, Civil Service (Medical): No    Lack of Transportation (Non-Medical): No  Recent Concern: Transportation Needs - Unmet Transportation Needs (01/10/2024)   PRAPARE - Transportation    Lack of Transportation (Medical): Yes    Lack of Transportation (Non-Medical): Yes  Physical Activity: Inactive (01/10/2024)   Exercise Vital Sign    Days of Exercise per Week: 0 days    Minutes of Exercise per Session: Not on file  Stress: Stress Concern Present (01/10/2024)   [employer] of Occupational Health - Occupational Stress Questionnaire    Feeling of Stress: Rather much  Social Connections: Moderately Isolated (01/10/2024)   Social Connection and Isolation Panel    Frequency of Communication with Friends and Family: More than three  times a week    Frequency of Social Gatherings with Friends and Family: Never    Attends Religious Services: More than 4 times per year    Active  Member of [employer] or Organizations: No    Attends Engineer, structural: Not on file    Marital Status: Never married  Intimate Partner Violence: Not At Risk (01/21/2024)   Humiliation, Afraid, Rape, and Kick questionnaire    Fear of Current or Ex-Partner: No    Emotionally Abused: No    Physically Abused: No    Sexually Abused: No    Current Facility-Administered Medications (Endocrine & Metabolic):    betamethasone  acetate-betamethasone  sodium phosphate  (CELESTONE ) injection 12 mg       Current Facility-Administered Medications (Analgesics):    acetaminophen  (TYLENOL ) tablet 650 mg   aspirin  EC tablet 162 mg   Current Facility-Administered Medications (Hematological):    ferrous sulfate  tablet 325 mg   Current Facility-Administered Medications (Other):    ampicillin  (OMNIPEN) 2 g in sodium chloride  0.9 % 100 mL IVPB **IN FOLLOWED-BY LINKED GROUP WITH** [START ON 01/23/2024] amoxicillin  (AMOXIL ) capsule 500 mg   calcium  carbonate (TUMS - dosed in mg elemental calcium ) chewable tablet 400 mg of elemental calcium    cyclobenzaprine  (FLEXERIL ) tablet 10 mg   docusate sodium  (COLACE) capsule 100 mg   polyethylene glycol (MIRALAX  / GLYCOLAX ) packet 17 g   prenatal multivitamin tablet 1 tablet   sodium chloride  flush (NS) 0.9 % injection 3-10 mL   sodium chloride  flush (NS) 0.9 % injection 3-10 mL   zolpidem  (AMBIEN ) tablet 5 mg  No current outpatient medications on file.   No Known Allergies   Impression/Counseling:  Previable PROM Previable rupture of membranes is defined by rupture of the fetal membranes at less than [redacted] weeks gestation. PROM in the midtrimester (16-24 weeks) can be associated with significant risks including pulmonary hypoplasia, fetal demise, maternal and fetal sepsis. While prior postnatal outcomes were dismal, more contemporary data suggest that outcomes are more variable and reflect the degree of pulmonary hypoplasia present at birth as well as  complicating factors (gestational age at delivery, presence/absence of clinical intrauterine infection).   Surviving infants often face significant short and long term morbidity including risk for chronic lung disease (bronchopulmonary dysplasia). For undelivered patients following the first 48 hrs, a prolonged period of pregnancy latency may be observed. There is sparse data to guide management of previable PPROM.   Following an initial period of observation, outpatient management until fetal viability is reasonable with frequent maternal surveillance including weekly OB visits and assessment of fetal viability. Antenatal steroids are likely beneficial beginning at [redacted] weeks gestation as is latency antibiotics.   Management beyond the point of fetal viability is typically accomplished at a specialized perinatal center with maternal-fetal medicine, neonatology and ancillary support services. Additional obstetrical and neonatal interventions such as cesarean delivery should be individualized.   Serial evaluation of fetal growth is recommended and vigilance for complications such as intrauterine infection, placental abruption, preterm labor, and cord prolapse.   We discussed this the diagnosis and management in detail, including uncertainty with individual outcome for her fetus.  She is agreeable to the recommendations outlined below.  Including 2x weekly AFI Daily NST after 24-26 weeks - 2x daily FHT prior to that time Repeat growth q3-4 weeks NICU consult- performed BMZ -1st dose administered today with latency abx- rescue BMZ if > 7 days and imminent delivery is suspected. -Reassess amniotic fluid  Repeat growth in 1 week.  All questions answered I spent 45 minutes with > 50% in direct communication with Ms. Carrell, medical record review and care coordination.  Nathanel DOROTHA Fetters, MD

## 2024-01-21 NOTE — H&P (Signed)
 FACULTY PRACTICE ANTEPARTUM ADMISSION HISTORY AND PHYSICAL NOTE   History of Present Illness: [name] is a 33 y.o. [id number] at [redacted]w[redacted]d admitted for previable and prolonged PPROM that occurred since [redacted]w[redacted]d  on 12/17/2023.  She denies any abdominal pain, fevers, back pain, contractions, leaking of fluid or bleeding.  Reports goof fetal movement. Denies any abnormal vaginal discharge, dysuria, nausea, vomiting, other GI or GU symptoms or other general symptoms.  Patient Active Problem List   Diagnosis Date Noted   Previable and prolonged preterm premature rupture of membranes (PPROM) since [redacted]w[redacted]d 01/21/2024   History of preeclampsia, prior pregnancy, currently pregnant 01/21/2024   History of postpartum hemorrhage 01/21/2024   [redacted] weeks gestation of pregnancy 01/21/2024   Constipation during pregnancy in second trimester 12/28/2023   Elevated AFP 12/27/2023   Lewis isoimmunization during pregnancy 12/20/2023   Oligohydramnios antepartum, second trimester 12/17/2023   Alpha thalassemia silent carrier 12/17/2023   Trichomonal vaginitis in pregnancy in first trimester 12/17/2023   Chlamydia trachomatis infection in pregnancy 12/17/2023   History of preterm delivery 11/24/2023   History of postpartum gestational hypertension 11/24/2023   Supervision of high risk pregnancy, antepartum 10/26/2023    Past Medical History:  Diagnosis Date   Abdominal trauma 10/21/2011   Blood transfusion    2010 s/p SVD   Chlamydia trachomatis infection in pregnancy 12/17/2023   Chronic back pain    Dental caries 10/22/2011   Depression    not on meds   GERD (gastroesophageal reflux disease)    High-risk pregnancy supervision 10/22/2011   History of postpartum anemia    Mental disorder    Pelvic pain in female 06/15/2012   Right adnexal tenderness, negative ultrasound   Preeclampsia 07/13/2008   required Mag Sulfate   Preterm labor and delivery 04/08/2010   G2 pregnancy, occured at 34 weeks    Trichomonal vaginitis in pregnancy in first trimester 12/17/2023   UTI (urinary tract infection) in pregnancy, antepartum    09/2011 tx with abx    Past Surgical History:  Procedure Laterality Date   FINGER SURGERY     right thumb in childhood    OB History  Gravida Para Term Preterm AB Living  4 3 2 1  0 3  SAB IAB Ectopic Multiple Live Births  0 0 0 0 3    # Outcome Date GA Lbr Len/2nd Weight Sex Type Anes PTL Lv  4 Current           3 Term 12/11/11 [redacted]w[redacted]d 273:55 / 00:09 3465 g M Vag-Spont None  LIV  2 Preterm 04/08/10 [redacted]w[redacted]d   M Vag-Spont   LIV     Birth Comments: PPROM at 30 wk,+GBS, IOL at 34 wk  1 Term 10/16/08 [redacted]w[redacted]d   F Vag-Spont   LIV     Birth Comments: Had preeclampsia, required magnesium  sulfate     Complications: Preeclampsia    Social History   Socioeconomic History   Marital status: Single    Spouse name: Not on file   Number of children: Not on file   Years of education: Not on file   Highest education level: 10th grade  Occupational History   Not on file  Tobacco Use   Smoking status: Some Days    Types: Cigarettes   Smokeless tobacco: Never  Vaping Use   Vaping status: Never Used  Substance and Sexual Activity   Alcohol use: Not Currently    Comment: occ   Drug use: Not Currently   Sexual activity:  Not Currently    Birth control/protection: None  Other Topics Concern   Not on file  Social History Narrative   Not on file   Social Drivers of Health   Financial Resource Strain: Low Risk  (01/10/2024)   Overall Financial Resource Strain (CARDIA)    Difficulty of Paying Living Expenses: Not very hard  Recent Concern: Financial Resource Strain - High Risk (11/24/2023)   Overall Financial Resource Strain (CARDIA)    Difficulty of Paying Living Expenses: Very hard  Food Insecurity: No Food Insecurity (01/21/2024)   Hunger Vital Sign    Worried About Running Out of Food in the Last Year: Never true    Ran Out of Food in the Last Year: Never true   Recent Concern: Food Insecurity - Food Insecurity Present (01/10/2024)   Hunger Vital Sign    Worried About Running Out of Food in the Last Year: Sometimes true    Ran Out of Food in the Last Year: Sometimes true  Transportation Needs: No Transportation Needs (01/21/2024)   PRAPARE - Administrator, Civil Service (Medical): No    Lack of Transportation (Non-Medical): No  Recent Concern: Transportation Needs - Unmet Transportation Needs (01/10/2024)   PRAPARE - Transportation    Lack of Transportation (Medical): Yes    Lack of Transportation (Non-Medical): Yes  Physical Activity: Inactive (01/10/2024)   Exercise Vital Sign    Days of Exercise per Week: 0 days    Minutes of Exercise per Session: Not on file  Stress: Stress Concern Present (01/10/2024)   [employer] of Occupational Health - Occupational Stress Questionnaire    Feeling of Stress: Rather much  Social Connections: Moderately Isolated (01/10/2024)   Social Connection and Isolation Panel    Frequency of Communication with Friends and Family: More than three times a week    Frequency of Social Gatherings with Friends and Family: Never    Attends Religious Services: More than 4 times per year    Active Member of [employer] or Organizations: No    Attends Engineer, structural: Not on file    Marital Status: Never married    Family History  Problem Relation Age of Onset   Asthma Mother    Diabetes Father    Hypertension Father    Heart disease Father        afib, died 02/14/2020   Mental illness Brother        bipolar, schizophrenia   Anesthesia problems Neg Hx     No Known Allergies  Medications Prior to Admission  Medication Sig Dispense Refill Last Dose/Taking   aspirin  EC 81 MG tablet Take 2 tablets (162 mg total) by mouth daily. Swallow whole. 60 tablet 12 01/21/2024 Morning   cyclobenzaprine  (FLEXERIL ) 10 MG tablet Take 1 tablet (10 mg total) by mouth every 8 (eight) hours as needed for muscle  spasms. 30 tablet 0 01/20/2024 Morning   docusate sodium  (COLACE) 50 MG capsule Take 50 mg by mouth 2 (two) times daily.   01/21/2024 Morning   ferrous sulfate  325 (65 FE) MG tablet TAKE 1 TABLET BY MOUTH EVERY DAY 30 tablet 0 01/21/2024 Morning   Prenatal Vit-Fe Fumarate-FA (PRENATAL MULTIVITAMIN) TABS tablet Take 1 tablet by mouth daily at 12 noon. 30 tablet 0 01/21/2024 Morning   promethazine  (PHENERGAN ) 25 MG tablet Take 1 tablet (25 mg total) by mouth every 6 (six) hours as needed. 40 tablet 0 01/21/2024 Morning   acetaminophen  (TYLENOL ) 325 MG tablet Take 2 tablets (  650 mg total) by mouth every 6 (six) hours as needed.      polyethylene glycol (MIRALAX ) 17 g packet Take 17 g by mouth every 3 (three) days. 14 each 0    Review of Systems - Negative except what is mentioned in HPI  Vitals:  BP 117/73 (BP Location: Right Arm)   Pulse 83   Temp 98.4 F (36.9 C) (Oral)   Resp 18   LMP 08/14/2023   SpO2 100%  Physical Examination: CONSTITUTIONAL: Well-developed, well-nourished female in no acute distress.  HENT:  Normocephalic, atraumatic, External right and left ear normal. Oropharynx is clear and moist EYES: Conjunctivae and EOM are normal. Pupils are equal, round, and reactive to light. No scleral icterus.  NECK: Normal range of motion, supple, no masses SKIN: Skin is warm and dry. No rash noted. Not diaphoretic. No erythema. No pallor. NEUROLOGIC: Alert and oriented to person, place, and time. Normal reflexes, muscle tone coordination. No cranial nerve deficit noted. PSYCHIATRIC: Normal mood and affect. Normal behavior. Normal judgment and thought content. CARDIOVASCULAR: Normal heart rate noted, regular rhythm RESPIRATORY: Effort and breath sounds normal, no problems with respiration noted ABDOMEN: Soft, nontender, nondistended, gravid. MUSCULOSKELETAL: Normal range of motion. No edema and no tenderness. 2+ distal pulses.  Cervix: Deferred  Membranes:ruptured Fetal Monitoring:Baseline:  150 bpm, Variability: Good {> 6 bpm), Accelerations: Non-reactive but appropriate for gestational age, and Decelerations: Absent Tocometer: Flat  Labs:  Results for orders placed or performed during the hospital encounter of 01/21/24 (from the past 24 hours)  CBC with Differential/Platelet   Collection Time: 01/21/24 12:01 PM  Result Value Ref Range   WBC 8.5 4.0 - 10.5 K/uL   RBC 4.24 3.87 - 5.11 MIL/uL   Hemoglobin 11.2 (L) 12.0 - 15.0 g/dL   HCT 65.2 (L) 63.9 - 53.9 %   MCV 81.8 80.0 - 100.0 fL   MCH 26.4 26.0 - 34.0 pg   MCHC 32.3 30.0 - 36.0 g/dL   RDW 74.9 (H) 88.4 - 84.4 %   Platelets 309 150 - 400 K/uL   nRBC 0.2 0.0 - 0.2 %   Neutrophils Relative % 58 %   Neutro Abs 4.9 1.7 - 7.7 K/uL   Lymphocytes Relative 36 %   Lymphs Abs 3.1 0.7 - 4.0 K/uL   Monocytes Relative 3 %   Monocytes Absolute 0.3 0.1 - 1.0 K/uL   Eosinophils Relative 1 %   Eosinophils Absolute 0.1 0.0 - 0.5 K/uL   Basophils Relative 2 %   Basophils Absolute 0.2 (H) 0.0 - 0.1 K/uL   WBC Morphology See Note    RBC Morphology See Note    Smear Review See Note    nRBC 0 0 /100 WBC   Abs Immature Granulocytes 0.00 0.00 - 0.07 K/uL  Comprehensive metabolic panel   Collection Time: 01/21/24 12:01 PM  Result Value Ref Range   Sodium 136 135 - 145 mmol/L   Potassium 3.7 3.5 - 5.1 mmol/L   Chloride 106 98 - 111 mmol/L   CO2 20 (L) 22 - 32 mmol/L   Glucose, Bld 74 70 - 99 mg/dL   BUN <5 (L) 6 - 20 mg/dL   Creatinine, Ser 9.43 0.44 - 1.00 mg/dL   Calcium  9.3 8.9 - 10.3 mg/dL   Total Protein 6.5 6.5 - 8.1 g/dL   Albumin 2.9 (L) 3.5 - 5.0 g/dL   AST 25 15 - 41 U/L   ALT 27 0 - 44 U/L   Alkaline Phosphatase 46  38 - 126 U/L   Total Bilirubin 0.7 0.0 - 1.2 mg/dL   GFR, Estimated >39 >39 mL/min   Anion gap 10 5 - 15  Type and screen [facility]   Collection Time: 01/21/24 12:03 PM  Result Value Ref Range   ABO/RH(D) O POS    Antibody Screen POS    Sample Expiration       01/24/2024,2359 Performed at [facility], [address], [place], [place] [address]     Imaging Studies: US  EKG SITE RITE Result Date: 01/21/2024 If Site Rite image not attached, placement could not be confirmed due to current cardiac rhythm.  US  MFM OB FOLLOW UP Result Date: 01/09/2024 ----------------------------------------------------------------------  OBSTETRICS REPORT                        (Signed Final 01/09/2024 11:39 am) ---------------------------------------------------------------------- Patient Info  ID #:       [id number]                          D.O.B.:  January 26, 1991 (33 yrs)(F)  Name:       [name]               Visit Date: 01/09/2024 09:20 am ---------------------------------------------------------------------- Performed By  Attending:        Fredia Fresh MD        Referred By:       [facility] MAU/Triage  Performed By:     Dwayne Blunt       [facility] ---------------------------------------------------------------------- Orders  #  Description                           Code        Ordered By  1  US  MFM OB FOLLOW UP                   23183.98    ALAIN SOR ----------------------------------------------------------------------  #  Order #                     Accession #                Episode #  1  [id number]                   [id number]                 [id number] ---------------------------------------------------------------------- Indications  Preterm contractions                            O47.00  Premature rupture of membranes - leaking        O42.90  fluid  Poor obstetric history: Previous preterm        O09.219  delivery, antepartum  [redacted] weeks gestation of pregnancy                 Z3A.21 ---------------------------------------------------------------------- Fetal Evaluation  Num Of  Fetuses:          1  Fetal Heart Rate(bpm):   146  Cardiac Activity:        Observed   Presentation:            Breech  Placenta:                Anterior  P. Cord Insertion:       Not well visualized  Amniotic Fluid  AFI FV:      Oligohydramnios  AFI Sum(cm)     %Tile       Largest Pocket(cm)  0.8             < 3         0.8  RUQ(cm)  0.8 ---------------------------------------------------------------------- Biometry  BPD:      40.3  mm     G. Age:  18w 2d        < 1  %    CI:        60.36   %    70 - 86                                                          FL/HC:       18.9  %    15.9 - 20.3  HC:      168.1  mm     G. Age:  19w 3d        1.1  %    HC/AC:       1.07       1.06 - 1.25  AC:      156.4  mm     G. Age:  20w 6d         32  %    FL/BPD:      78.7  %  FL:       31.7  mm     G. Age:  19w 6d          8  %    FL/AC:       20.3  %    20 - 24  Est. FW:     338   gm   0 lb 12 oz       8  % ---------------------------------------------------------------------- OB History  Gravidity:    4         Term:   2        Prem:   1        SAB:   0  TOP:          0       Ectopic:  0        Living: 3 ---------------------------------------------------------------------- Gestational Age  LMP:           21w 1d        Date:  08/14/23                   EDD:   05/20/24  U/S Today:     19w 4d                                        EDD:   05/31/24  Best:          21w 1d     Det. By:  LMP  (08/14/23)          EDD:   05/20/24 ---------------------------------------------------------------------- Anatomy  Ventricles:            Appears normal         Stomach:                Not well visualized  Heart:                 Not well visualized    Kidneys:                Not well visualized  Diaphragm:             Not well visualized    Bladder:                Appears normal ---------------------------------------------------------------------- Impression  Patient presented to the MAU with uterine contractions. She  had PPROM at 17-weeks' gestation.  The estimated fetal weight is 338 grams. Breech  presentation. Severe  oligohydramnios is seen. The  measurable pocket of amniotic fluid is less than 1 cm. Good  fetal heart activity is seen.  Impression: Previable fetus.  Antenatal corticosteroids are not indicated. ----------------------------------------------------------------------                 Fredia Fresh, MD Electronically Signed Final Report   01/09/2024 11:39 am ----------------------------------------------------------------------   US  Abdomen Limited RUQ (LIVER/GB) Result Date: 01/08/2024 CLINICAL DATA:  757517 Chronic RUQ pain 242482 EXAM: ULTRASOUND ABDOMEN LIMITED RIGHT UPPER QUADRANT COMPARISON:  None Available. FINDINGS: Gallbladder: A couple of punctate stones or nodular sludge noted. In the cystic duct, there is a 6 mm calculus. No wall thickening or pericholecystic fluid. Positive sonographic Murphy's sign noted by sonographer. Common bile duct: Diameter: 4 mm Liver: Normal echogenicity. No focal lesion identified. No intrahepatic biliary ductal dilation. Portal vein is patent on color Doppler imaging with normal direction of blood flow towards the liver. Right Kidney: Partially visualized. No mass. No hydronephrosis or nephrolithiasis. Other: None. IMPRESSION: 6 mm calculus in the cystic duct. While there is no wall thickening or pericholecystic fluid, the sonographer reports a positive sonographic Murphy sign. This could represent early changes of acute cholecystitis and a nuclear medicine hepatobiliary scan is recommended for further characterization. Electronically Signed   By: Rogelia Myers M.D.   On: 01/08/2024 13:03   US  MFM OB DETAIL +14 WK Result Date: 12/28/2023 ----------------------------------------------------------------------  OBSTETRICS REPORT                       (Signed Final 12/28/2023 01:11 pm) ---------------------------------------------------------------------- Patient Info  ID #:       [id number]                          D.O.B.:  11-05-1990 (33 yrs)(F)  Name:       [name]                Visit Date: 12/27/2023 10:00 am ---------------------------------------------------------------------- Performed By  Attending:        Steffan Keys MD         Secondary Phy.:    [facility]  Performed By:     Powell Breen          [facility]  Referred By:      [facility] MAU/Triage ---------------------------------------------------------------------- Orders  #  Description                           Code        Ordered By  1  US  MFM OB DETAIL +14 WK               23188.98    CHARLIE PICKENS ----------------------------------------------------------------------  #  Order #                     Accession #                Episode #  1  [id number]                   [id number]                 [id number] ---------------------------------------------------------------------- Indications  Premature rupture of membranes - leaking        O42.90  fluid  Encounter for other specified antenatal         Z36.89  screening  AFP screen positive (5.2 MoM)  [redacted] weeks gestation of pregnancy                 Z3A.19  Poor obstetric history: Previous preterm        O09.219  delivery, antepartum ---------------------------------------------------------------------- Fetal Evaluation  Num Of Fetuses:          1  Fetal Heart Rate(bpm):   129  Cardiac Activity:        Observed  Presentation:            Breech  Placenta:                Anterior  P. Cord Insertion:       Visualized, central  Amniotic Fluid  AFI FV:      Oligohydramnios                              Largest Pocket(cm)                              1.5 ---------------------------------------------------------------------- Biometry  BPD:      35.5  mm     G. Age:  16w 6d        < 1  %    CI:        57.24   %    70 - 86  FL/HC:       17.9  %    16.1 - 18.3  HC:       153.1  mm     G. Age:  18w 2d          7  %    HC/AC:       1.06       1.09 - 1.39  AC:      144.5  mm     G. Age:  19w 5d         62  %    FL/BPD:      77.2  %  FL:       27.4  mm     G. Age:  18w 3d         14  %    FL/AC:       19.0  %    20 - 24  HUM:      25.6  mm     G. Age:  18w 0d         17  %  CER:      18.2  mm     G. Age:  18w 0d          8  %  NFT:       5.8  mm  Est. FW:     266   gm     0 lb 9 oz     27  % ---------------------------------------------------------------------- OB History  Gravidity:    4         Term:   2        Prem:   1        SAB:   0  TOP:          0       Ectopic:  0        Living: 3 ---------------------------------------------------------------------- Gestational Age  LMP:           19w 2d        Date:  08/14/23                   EDD:   05/20/24  U/S Today:     18w 2d                                        EDD:   05/27/24  Best:          19w 2d     Det. By:  LMP  (08/14/23)          EDD:   05/20/24 ---------------------------------------------------------------------- Anatomy  Cranium:               Appears normal         Aortic Arch:            Not well visualized  Cavum:                 Appears normal         Ductal Arch:            Not well visualized  Ventricles:            Appears normal         Diaphragm:              Appears normal  Choroid Plexus:  Appears normal         Stomach:                Appears normal, left                                                                        sided  Cerebellum:            Appears normal         Abdomen:                Appears normal  Face:                  Orbits nl; profile not Abdominal Wall:         Not well visualized                         well visualized  Lips:                  Not well visualized    Cord Vessels:           Appears normal (3                                                                        vessel cord)  Palate:                Not well visualized    Kidneys:                Not well visualized   Thoracic:              Appears normal         Bladder:                Appears normal  Heart:                 Not well visualized    Spine:                  Not well visualized  RVOT:                  Not well visualized    Upper Extremities:      Visualized  LVOT:                  Not well visualized    Lower Extremities:      Visualized  Other:  Fetus appears to be a female. Technically difficult due to low amniotic          fluid. ---------------------------------------------------------------------- Cervix Uterus Adnexa  Cervix  Length:              3  cm.  Normal appearance by transabdominal scan  Uterus  No abnormality visualized.  Right Ovary  Not visualized.  Left Ovary  Not visualized.  Cul De Sac  No free fluid seen.  Adnexa  No  abnormality visualized ---------------------------------------------------------------------- Comments  This patient was admitted due to PPROM.  The overall EFW obtained today was 9 ounces (266 g).  Oligohydramnios with a total AFI of 1.5 cm was noted.  The views of the fetal anatomy were limited today due to  oligohydramnios.  The fetus was in the breech presentation. ----------------------------------------------------------------------                   Steffan Keys, MD Electronically Signed Final Report   12/28/2023 01:11 pm ----------------------------------------------------------------------     Assessment and Plan: Principal Problem:   Previable and prolonged preterm premature rupture of membranes (PPROM) since [redacted]w[redacted]d Active Problems:   Supervision of high risk pregnancy, antepartum   History of preterm delivery   History of postpartum gestational hypertension   Oligohydramnios antepartum, second trimester   Alpha thalassemia silent carrier   Trichomonal vaginitis in pregnancy in first trimester   Chlamydia trachomatis infection in pregnancy   Lewis isoimmunization during pregnancy   Elevated AFP   Constipation during pregnancy in second trimester   History of  preeclampsia, prior pregnancy, currently pregnant   History of postpartum hemorrhage   [redacted] weeks gestation of pregnancy   33 y.o. is a [id number] at [redacted]w[redacted]d with previable and prolonged PPROM requiring inpatient admission.  It was emphasized to the patient that she needs inpatient observation because she is at elevated risk for complications secondary to PPROM such as chorioamnionitis, placental abruption, umbilical cord prolapse, maternal or fetal distress; and these may constitute indications for delivery. 1. Latency antibiotics course for 7 days: Ampicillin  2 g IV every 6 hours for 48 hours, followed by Amoxicillin  500 mg orally three times daily for an additional five days & Azithromycin  1 g PO once.  2. Course of betamethasone  for reduction of fetal morbidity/mortality risk.  If more than 14 days passes since administration of BMZ and there is reason to believe the patient may need delivery, would consider a rescue course of steroids (but not more than two courses total for the pregnancy).  3. Course of magnesium  sulfate for neuroprotection if indicated, if delivery is imminent 4. Timing of delivery should be anticipated by 34 weeks provided that testing remains reassuring and there is no evidence of chorioamnionitis. 5. Delivery prior to 34 weeks for nonreassuring fetal testing, evidence of chorioamnionitis, or other evidence of maternal or fetal deterioration. 6. Mode of delivery is dependent of urgency of need for delivery, presentation of fetus. Patient was counseled about possible need for emergent cesarean section, and high risk that it will be a classical cesarean section, which would necessitate all subsequent pregnancies to be delivered via cesarean section.  The risks of surgery were discussed with the patient including but were not limited to: bleeding which may require transfusion or reoperation; infection which may require antibiotics; injury to bowel, bladder, ureters or other surrounding  organs; injury to the fetus; need for additional procedures including hysterectomy in the event of a life-threatening hemorrhage; formation of adhesions; placental abnormalities with subsequent pregnancies; incisional problems; thromboembolic phenomenon and other postoperative/anesthesia complications.  The patient concurred with the proposed plan, giving informed written consent for the procedure.   Consent was placed in patient's chart.  7. Interval growth should be continually reassessed every 3-4 weeks, next one is due next week. AFIs to be twice weekly as per MFM given additional increased risk of stillbirth due to elevated AFP. 8. Twice daily fetal heart rate dopplers for now, then daily NST after 24 weeks as per MFM  recommendations (more often if clinically warranted). Serial vital signs and serial clinical exams. Cervical exams only as clinically indicated to reduce risk of infection. 9. Type and Screen and CBC q 72 h (noting that WBC at or above 20,000 constitutes leukocytosis for pregnancy).   IV access to be maintained at all times. 8. NICU consultation done, appreciate Dr. Ronna input 9. MFM consultation done, appreciate Dr. Marcus recommendations 10. Routine prenatal and antenatal care (needs TOC for Trich in late July, GTT in third trimester etc)   GLORIS HUGGER, MD, FACOG Obstetrician & Gynecologist, [facility] for [employer], [facility]

## 2024-01-21 NOTE — Progress Notes (Signed)
 Crystal, RN aware that PICC will be placed 7/12.  Patient has USGPIV that was placed today and sufficient for IV needs.

## 2024-01-22 DIAGNOSIS — Z3A23 23 weeks gestation of pregnancy: Secondary | ICD-10-CM

## 2024-01-22 MED ORDER — SODIUM CHLORIDE 0.9% FLUSH
10.0000 mL | Freq: Two times a day (BID) | INTRAVENOUS | Status: DC
  Administered 2024-01-22 – 2024-01-28 (×11): 10 mL

## 2024-01-22 MED ORDER — SODIUM CHLORIDE 0.9% FLUSH: 10.0000 mL | INTRAVENOUS | Status: DC | PRN

## 2024-01-22 MED ORDER — CHLORHEXIDINE GLUCONATE CLOTH 2 % EX PADS
6.0000 | MEDICATED_PAD | Freq: Every day | CUTANEOUS | Status: DC
  Administered 2024-01-23 – 2024-01-28 (×6): 6 via TOPICAL

## 2024-01-22 NOTE — Plan of Care (Signed)

## 2024-01-22 NOTE — Progress Notes (Signed)
 Peripherally Inserted Central Catheter Placement  The IV Nurse has discussed with the patient and/or persons authorized to consent for the patient, the purpose of this procedure and the potential benefits and risks involved with this procedure.  The benefits include less needle sticks, lab draws from the catheter, and the patient may be discharged home with the catheter. Risks include, but not limited to, infection, bleeding, blood clot (thrombus formation), and puncture of an artery; nerve damage and irregular heartbeat and possibility to perform a PICC exchange if needed/ordered by physician.  Alternatives to this procedure were also discussed.  Bard Power PICC patient education guide, fact sheet on infection prevention and patient information card has been provided to patient /or left at bedside.    PICC Placement Documentation  PICC Double Lumen 01/22/24 Right Basilic 37 cm 1 cm (Active)  Indication for Insertion or Continuance of Line Prolonged intravenous therapies 01/22/24 1547  Exposed Catheter (cm) 1 cm 01/22/24 1547  Site Assessment Clean, Dry, Intact 01/22/24 1547  Lumen #1 Status Flushed;Saline locked;Blood return noted 01/22/24 1547  Lumen #2 Status Flushed;Saline locked;Blood return noted 01/22/24 1547  Dressing Type Transparent;Securing device 01/22/24 1547  Dressing Status Antimicrobial disc/dressing in place;Clean, Dry, Intact 01/22/24 1547  Line Care Connections checked and tightened 01/22/24 1547  Line Adjustment (NICU/IV Team Only) No 01/22/24 1547  Dressing Intervention New dressing;Adhesive placed at insertion site (IV team only);Adhesive placed around edges of dressing (IV team/ICU RN only) 01/22/24 1547  Dressing Change Due 01/29/24 01/22/24 1547       [name] 01/22/2024, 3:48 PM

## 2024-01-22 NOTE — Progress Notes (Signed)
 FACULTY PRACTICE ANTEPARTUM PROGRESS NOTE  [name] is a 33 y.o. [phone] at [redacted]w[redacted]d who is admitted for PPROM and oligohydrmanios.  Estimated Date of Delivery: 05/20/24 Fetal presentation is breech on 01/09/24.  Length of Stay:  1 Days. Admitted 01/21/2024  Subjective: Pt comfortable.  She denies fever/chills. Patient reports normal fetal movement.  She denies uterine contractions, denies bleeding.  Some light leaking of fluid per vagina was noted.  Vitals:  Blood pressure (!) 123/56, pulse 88, temperature 99 F (37.2 C), temperature source Oral, resp. rate 16, height 5' 7 (1.702 m), weight 83.3 kg, last menstrual period 08/14/2023, SpO2 100%. Physical Examination: CONSTITUTIONAL: Well-developed, well-nourished female in no acute distress.  HENT:  Normocephalic, atraumatic, External right and left ear normal. Oropharynx is clear and moist EYES: Conjunctivae and EOM are normal.  NECK: Normal range of motion, supple, no masses. SKIN: Skin is warm and dry. No rash noted. Not diaphoretic. No erythema. No pallor. NEUROLGIC: Alert and oriented to person, place, and time. Normal reflexes, muscle tone coordination. No cranial nerve deficit noted. PSYCHIATRIC: Normal mood and affect. Normal behavior. Normal judgment and thought content. CARDIOVASCULAR: Normal heart rate noted, regular rhythm RESPIRATORY: Effort and breath sounds normal, no problems with respiration noted MUSCULOSKELETAL: Normal range of motion. No edema and no tenderness. ABDOMEN: Soft, nontender, nondistended, gravid. CERVIX: deferred  Fetal monitoring: FHR: 145 bpm,   Results for orders placed or performed during the hospital encounter of 01/21/24 (from the past 48 hours)  CBC with Differential/Platelet     Status: Abnormal   Collection Time: 01/21/24 12:01 PM  Result Value Ref Range   WBC 8.5 4.0 - 10.5 K/uL   RBC 4.24 3.87 - 5.11 MIL/uL   Hemoglobin 11.2 (L) 12.0 - 15.0 g/dL   HCT 65.2 (L) 63.9 - 53.9 %   MCV  81.8 80.0 - 100.0 fL   MCH 26.4 26.0 - 34.0 pg   MCHC 32.3 30.0 - 36.0 g/dL   RDW 74.9 (H) 88.4 - 84.4 %   Platelets 309 150 - 400 K/uL   nRBC 0.2 0.0 - 0.2 %   Neutrophils Relative % 58 %   Neutro Abs 4.9 1.7 - 7.7 K/uL   Lymphocytes Relative 36 %   Lymphs Abs 3.1 0.7 - 4.0 K/uL   Monocytes Relative 3 %   Monocytes Absolute 0.3 0.1 - 1.0 K/uL   Eosinophils Relative 1 %   Eosinophils Absolute 0.1 0.0 - 0.5 K/uL   Basophils Relative 2 %   Basophils Absolute 0.2 (H) 0.0 - 0.1 K/uL   WBC Morphology See Note     Comment: Morphology unremarkable   RBC Morphology See Note     Comment: Morphology unremarkable   Smear Review See Note     Comment: Normal Platelet Morphology   nRBC 0 0 /100 WBC   Abs Immature Granulocytes 0.00 0.00 - 0.07 K/uL    Comment: Performed at [facility], [address], [place], [place] [address]  Comprehensive metabolic panel     Status: Abnormal   Collection Time: 01/21/24 12:01 PM  Result Value Ref Range   Sodium 136 135 - 145 mmol/L   Potassium 3.7 3.5 - 5.1 mmol/L   Chloride 106 98 - 111 mmol/L   CO2 20 (L) 22 - 32 mmol/L   Glucose, Bld 74 70 - 99 mg/dL    Comment: Glucose reference range applies only to samples taken after fasting for at least 8 hours.   BUN <5 (L)  6 - 20 mg/dL   Creatinine, Ser 9.43 0.44 - 1.00 mg/dL   Calcium  9.3 8.9 - 10.3 mg/dL   Total Protein 6.5 6.5 - 8.1 g/dL   Albumin 2.9 (L) 3.5 - 5.0 g/dL   AST 25 15 - 41 U/L   ALT 27 0 - 44 U/L   Alkaline Phosphatase 46 38 - 126 U/L   Total Bilirubin 0.7 0.0 - 1.2 mg/dL   GFR, Estimated >39 >39 mL/min    Comment: (NOTE) Calculated using the CKD-EPI Creatinine Equation (2021)    Anion gap 10 5 - 15    Comment: Performed at [facility], [address] [place], [place] [address]  Type and screen [facility]     Status: None (Preliminary result)   Collection Time: 01/21/24 12:03 PM  Result Value Ref Range   ABO/RH(D) O POS    Antibody Screen POS     Sample Expiration 01/24/2024,2359    Antibody Identification      ANTI LEB Angelica b) ANTI LEA Angelica a) Performed at [facility], [address], [place], [place] [address]    Unit Number [id number]    Blood Component Type RED CELLS,LR    Unit division 00    Status of Unit ALLOCATED    Transfusion Status OK TO TRANSFUSE    Crossmatch Result COMPATIBLE    Unit Number [id number]    Blood Component Type RED CELLS,LR    Unit division 00    Status of Unit ALLOCATED    Transfusion Status OK TO TRANSFUSE    Crossmatch Result COMPATIBLE     I have reviewed the patient's current medications.  ASSESSMENT: Principal Problem:   Previable and prolonged preterm premature rupture of membranes (PPROM) since [redacted]w[redacted]d Active Problems:   Supervision of high risk pregnancy, antepartum   History of preterm delivery   History of postpartum gestational hypertension   Oligohydramnios antepartum, second trimester   Alpha thalassemia silent carrier   Trichomonal vaginitis in pregnancy in first trimester   Chlamydia trachomatis infection in pregnancy   Lewis isoimmunization during pregnancy   Elevated AFP   Constipation during pregnancy in second trimester   History of preeclampsia, prior pregnancy, currently pregnant   History of postpartum hemorrhage   [redacted] weeks gestation of pregnancy   PLAN: PPROM since 17.6 weeks: Admitted at 23 weeks at viability BMZ and latency abx.  Second dose of betamethasone  will be administered around 10 AM.  Latency abx x 7 days Pt is s/p MFM and Neonatology consults Magnesium  sulfate if conditions are trending toward delivery Delivery at 34 weeks if without choriomanionitis Mode of delivery will depend on fetal position, but patient is aware of increased risk of classical c section.   Continue routine antenatal care.   Jerilynn Buddle, MD [facility] Faculty Attending, Center for [facility] 01/22/2024 7:18 AM

## 2024-01-23 ENCOUNTER — Inpatient Hospital Stay (HOSPITAL_BASED_OUTPATIENT_CLINIC_OR_DEPARTMENT_OTHER)

## 2024-01-23 DIAGNOSIS — O4702 False labor before 37 completed weeks of gestation, second trimester: Secondary | ICD-10-CM

## 2024-01-23 DIAGNOSIS — Z3A23 23 weeks gestation of pregnancy: Secondary | ICD-10-CM

## 2024-01-23 DIAGNOSIS — O09212 Supervision of pregnancy with history of pre-term labor, second trimester: Secondary | ICD-10-CM

## 2024-01-23 LAB — CULTURE, BETA STREP (GROUP B ONLY)

## 2024-01-23 MED ORDER — PANTOPRAZOLE SODIUM 20 MG PO TBEC
20.0000 mg | DELAYED_RELEASE_TABLET | Freq: Every day | ORAL | Status: DC
  Administered 2024-01-23 – 2024-01-28 (×6): 20 mg via ORAL
  Filled 2024-01-23 (×6): qty 1

## 2024-01-23 NOTE — Progress Notes (Addendum)
 Faculty Note  Called by RN to see patient for some chest tightness.   Patient reports eating a plate of spaghetti and starting to feel some chest tightness afterwards. Has not had this specific pain before but reports the tightness worsened after the tried to eat cheesy bread after plate of spaghetti. Known gallstones. Had GERD with other pregnancies but not exactly this same feeling. Denies shortness of breath. Otherwise feeling well. Reports she lays down immediately after eating.   BP 135/80 (BP Location: Left Arm)   Pulse 92   Temp 98.8 F (37.1 C) (Oral)   Resp 18   Ht 5' 7 (1.702 m)   Wt 83.3 kg   LMP 08/14/2023   SpO2 100%   BMI 28.76 kg/m  Gen: alert, oriented CVS: RRR Resp: poor air movement in right lower lobe but movement heard in all lobes  Abd: soft, non-tender  A/P: 33 yo [id number] @ [redacted]w[redacted]d with PPROM at 17 weeks, oligohydramnios now admitted for monitoring s/p ANCS and on latency antibiotics c/o chest tightness, suspect reflux, will start on protonix  and encouraged her to sit upright for 2 hrs after every meal. She did experience some relief after belching during interview. If chest pain/pressure worsens, will obtain EKG/Cxr. Pt in agreement.   Daily protonix  Incentive spirometer Cont routine antenatal care   K. Yolanda Moats, MD, [facility] Attending Center for [facility])

## 2024-01-23 NOTE — Plan of Care (Signed)

## 2024-01-23 NOTE — Progress Notes (Signed)
 FACULTY PRACTICE ANTEPARTUM PROGRESS NOTE  [name] is a 33 y.o. [phone] at [redacted]w[redacted]d who is admitted for PPROM.  Estimated Date of Delivery: 05/20/24 Fetal presentation is breech.  Length of Stay:  2 Days. Admitted 01/21/2024  Subjective: Pt is comfortable.  She is s/p BMZ x 2 and IV abx.  Latency abx continue with  amoxicillinm. Patient reports normal fetal movement.  She denies uterine contractions, denies bleeding.  Light leaking of fluid per vagina.  Vitals:  Blood pressure 121/81, pulse 88, temperature 98.6 F (37 C), temperature source Oral, resp. rate 18, height 5' 7 (1.702 m), weight 83.3 kg, last menstrual period 08/14/2023, SpO2 100%. Physical Examination: CONSTITUTIONAL: Well-developed, well-nourished female in no acute distress.  HENT:  Normocephalic, atraumatic, External right and left ear normal. Oropharynx is clear and moist EYES: Conjunctivae and EOM are normal. NECK: Normal range of motion, supple, no masses. SKIN: Skin is warm and dry. No rash noted. Not diaphoretic. No erythema. No pallor. NEUROLGIC: Alert and oriented to person, place, and time. Normal reflexes, muscle tone coordination. No cranial nerve deficit noted. PSYCHIATRIC: Normal mood and affect. Normal behavior. Normal judgment and thought content. CARDIOVASCULAR: Normal heart rate noted, regular rhythm RESPIRATORY: Effort and breath sounds normal, no problems with respiration noted MUSCULOSKELETAL: Normal range of motion. No edema and no tenderness. ABDOMEN: Soft, nontender, nondistended, gravid. CERVIX: deferred  Fetal monitoring: FHR: 148 bpm,   Results for orders placed or performed during the hospital encounter of 01/21/24 (from the past 48 hours)  CBC with Differential/Platelet     Status: Abnormal   Collection Time: 01/21/24 12:01 PM  Result Value Ref Range   WBC 8.5 4.0 - 10.5 K/uL   RBC 4.24 3.87 - 5.11 MIL/uL   Hemoglobin 11.2 (L) 12.0 - 15.0 g/dL   HCT 65.2 (L) 63.9 - 53.9 %   MCV  81.8 80.0 - 100.0 fL   MCH 26.4 26.0 - 34.0 pg   MCHC 32.3 30.0 - 36.0 g/dL   RDW 74.9 (H) 88.4 - 84.4 %   Platelets 309 150 - 400 K/uL   nRBC 0.2 0.0 - 0.2 %   Neutrophils Relative % 58 %   Neutro Abs 4.9 1.7 - 7.7 K/uL   Lymphocytes Relative 36 %   Lymphs Abs 3.1 0.7 - 4.0 K/uL   Monocytes Relative 3 %   Monocytes Absolute 0.3 0.1 - 1.0 K/uL   Eosinophils Relative 1 %   Eosinophils Absolute 0.1 0.0 - 0.5 K/uL   Basophils Relative 2 %   Basophils Absolute 0.2 (H) 0.0 - 0.1 K/uL   WBC Morphology See Note     Comment: Morphology unremarkable   RBC Morphology See Note     Comment: Morphology unremarkable   Smear Review See Note     Comment: Normal Platelet Morphology   nRBC 0 0 /100 WBC   Abs Immature Granulocytes 0.00 0.00 - 0.07 K/uL    Comment: Performed at [facility], [address], [place], [place] [address]  Comprehensive metabolic panel     Status: Abnormal   Collection Time: 01/21/24 12:01 PM  Result Value Ref Range   Sodium 136 135 - 145 mmol/L   Potassium 3.7 3.5 - 5.1 mmol/L   Chloride 106 98 - 111 mmol/L   CO2 20 (L) 22 - 32 mmol/L   Glucose, Bld 74 70 - 99 mg/dL    Comment: Glucose reference range applies only to samples taken after fasting for at least 8 hours.  BUN <5 (L) 6 - 20 mg/dL   Creatinine, Ser 9.43 0.44 - 1.00 mg/dL   Calcium  9.3 8.9 - 10.3 mg/dL   Total Protein 6.5 6.5 - 8.1 g/dL   Albumin 2.9 (L) 3.5 - 5.0 g/dL   AST 25 15 - 41 U/L   ALT 27 0 - 44 U/L   Alkaline Phosphatase 46 38 - 126 U/L   Total Bilirubin 0.7 0.0 - 1.2 mg/dL   GFR, Estimated >39 >39 mL/min    Comment: (NOTE) Calculated using the CKD-EPI Creatinine Equation (2021)    Anion gap 10 5 - 15    Comment: Performed at [facility], [address], [place], [place] [address]  Type and screen [facility]     Status: None (Preliminary result)   Collection Time: 01/21/24 12:03 PM  Result Value Ref Range   ABO/RH(D) O POS    Antibody Screen POS     Sample Expiration 01/24/2024,2359    Antibody Identification      ANTI LEB Angelica b) ANTI LEA Angelica a) Performed at [facility], [address] [place], [place] [address]    Unit Number [id number]    Blood Component Type RED CELLS,LR    Unit division 00    Status of Unit ALLOCATED    Transfusion Status OK TO TRANSFUSE    Crossmatch Result COMPATIBLE    Unit Number [id number]    Blood Component Type RED CELLS,LR    Unit division 00    Status of Unit ALLOCATED    Transfusion Status OK TO TRANSFUSE    Crossmatch Result COMPATIBLE   Culture, beta strep (group b only)     Status: Abnormal   Collection Time: 01/21/24  4:25 PM   Specimen: Vaginal/Rectal; Genital  Result Value Ref Range   Specimen Description VAGINAL/RECTAL    Special Requests NONE    Culture (A)     GROUP B STREP(S.AGALACTIAE)ISOLATED TESTING AGAINST S. AGALACTIAE NOT ROUTINELY PERFORMED DUE TO PREDICTABILITY OF AMP/PEN/VAN SUSCEPTIBILITY. Performed at [facility], [address], [place], [place] [address]    Report Status 01/23/2024 FINAL     I have reviewed the patient's current medications.  ASSESSMENT: Principal Problem:   Previable and prolonged preterm premature rupture of membranes (PPROM) since [redacted]w[redacted]d Active Problems:   Supervision of high risk pregnancy, antepartum   History of preterm delivery   History of postpartum gestational hypertension   Oligohydramnios antepartum, second trimester   Alpha thalassemia silent carrier   Trichomonal vaginitis in pregnancy in first trimester   Chlamydia trachomatis infection in pregnancy   Lewis isoimmunization during pregnancy   Elevated AFP   Constipation during pregnancy in second trimester   History of preeclampsia, prior pregnancy, currently pregnant   History of postpartum hemorrhage   [redacted] weeks gestation of pregnancy   PLAN: PPROM since 17.6 weeks, BMZ x 2 completed.  Pt continues latency abx. If conditions worrisome for  delivery, will give magnesium  sulfate for neuroprotection. Delivery at 34 weeks per current guidelines Limited u/s today confirmed breech, at this time would likely need classical c section.   Continue routine antenatal care.   Jerilynn Buddle, MD [facility] Faculty Attending, Center for [facility] 01/23/2024 7:43 AM

## 2024-01-24 ENCOUNTER — Encounter

## 2024-01-24 LAB — CBC WITH DIFFERENTIAL/PLATELET
Abs Immature Granulocytes: 0 K/uL (ref 0.00–0.07)
Basophils Absolute: 0 K/uL (ref 0.0–0.1)
Basophils Relative: 0 %
Eosinophils Absolute: 0.1 K/uL (ref 0.0–0.5)
Eosinophils Relative: 1 %
HCT: 33.6 % — ABNORMAL LOW (ref 36.0–46.0)
Hemoglobin: 10.9 g/dL — ABNORMAL LOW (ref 12.0–15.0)
Lymphocytes Relative: 34 %
Lymphs Abs: 4 K/uL (ref 0.7–4.0)
MCH: 26.8 pg (ref 26.0–34.0)
MCHC: 32.4 g/dL (ref 30.0–36.0)
MCV: 82.8 fL (ref 80.0–100.0)
Monocytes Absolute: 0.5 K/uL (ref 0.1–1.0)
Monocytes Relative: 4 %
Neutro Abs: 7.2 K/uL (ref 1.7–7.7)
Neutrophils Relative %: 61 %
Platelets: 267 K/uL (ref 150–400)
RBC: 4.06 MIL/uL (ref 3.87–5.11)
RDW: 25 % — ABNORMAL HIGH (ref 11.5–15.5)
WBC: 11.8 K/uL — ABNORMAL HIGH (ref 4.0–10.5)
nRBC: 0 % (ref 0.0–0.2)
nRBC: 0 /100{WBCs}

## 2024-01-24 LAB — FIBRINOGEN: Fibrinogen: 300 mg/dL (ref 210–475)

## 2024-01-24 LAB — PROTIME-INR
INR: 1 (ref 0.8–1.2)
Prothrombin Time: 13.9 s (ref 11.4–15.2)

## 2024-01-24 LAB — APTT: aPTT: 24 s (ref 24–36)

## 2024-01-24 NOTE — Progress Notes (Addendum)
 Notified by RN that patient complaining of cramping, spotting and back pain. Patient seen and evaluated. Reports intense back pain and vaginal pressure. Notes spotting.  Vitals:   01/24/24 0347 01/24/24 0745  BP: 109/73 124/75  Pulse: 84 91  Resp: 17 19  Temp: 98.2 F (36.8 C) 98.3 F (36.8 C)  SpO2: 100% 98%   Gen: appears uncomfortable Abd: soft, gravid SSE: cervix appears closed, small blood in posterior fornix, no active bleeding  RN chaperone present for exam  A/P: Pelvic pain & spotting -- cervix appears closed on exam -- keep patient on monitor while uncomfortable, no regular contractions seen on monitoring from earlier this morning -- flexeril  prn pain to treat any musculoskeletal component -- will get CBC and coags to evaluate for any signs of abruption -- continue to monitor closely and reassess prn  Rollo ONEIDA Bring, MD, FACOG Obstetrician & Gynecologist, [facility] for [employer], [facility]

## 2024-01-24 NOTE — Progress Notes (Signed)
 FACULTY PRACTICE ANTEPARTUM PROGRESS NOTE  [name] is a 33 y.o. [phone] at [redacted]w[redacted]d who is admitted for PPROM.  Estimated Date of Delivery: 05/20/24 Fetal presentation is breech.  Length of Stay:  3 Days. Admitted 01/21/2024  Subjective: Pt seen, she notes cramping which started about an hour ago. Patient reports normal fetal movement.  She denies overt bleeding but notes minimal spotting.   There is light leaking of fluid per vagina.  Vitals:  Blood pressure 109/73, pulse 84, temperature 98.2 F (36.8 C), temperature source Oral, resp. rate 17, height 5' 7 (1.702 m), weight 83.3 kg, last menstrual period 08/14/2023, SpO2 100%. Physical Examination: CONSTITUTIONAL: Well-developed, well-nourished female in no acute distress.  HENT:  Normocephalic, atraumatic, External right and left ear normal. Oropharynx is clear and moist EYES: Conjunctivae and EOM are normal.  NECK: Normal range of motion, supple, no masses. SKIN: Skin is warm and dry. No rash noted. Not diaphoretic. No erythema. No pallor. NEUROLGIC: Alert and oriented to person, place, and time. Normal reflexes, muscle tone coordination. No cranial nerve deficit noted. PSYCHIATRIC: Normal mood and affect. Normal behavior. Normal judgment and thought content. CARDIOVASCULAR: Normal heart rate noted, regular rhythm RESPIRATORY: Effort and breath sounds normal, no problems with respiration noted MUSCULOSKELETAL: Normal range of motion. No edema and no tenderness. ABDOMEN: Soft, mildly tender, nondistended, gravid. CERVIX: deferred  Fetal monitoring: FHR: 145 bpm,   OBSTETRICS REPORT                        (Signed Final 01/23/2024 08:04 am) ---------------------------------------------------------------------- Patient Info    ID #:       [id number]                          D.O.B.:  [birth year] (33 yrs)(F)  Name:       [name] [facility]               Visit Date: 01/23/2024 07:08  am ---------------------------------------------------------------------- Performed By    Attending:        Nathanel Fetters      Secondary Phy.:    [facility]                    MD                                                              Care  Performed By:     Maybell Hair RDMS      [facility]  Referred By:      [facility] MAU/Triage ---------------------------------------------------------------------- Orders    #  Description                           Code  Ordered By  1  US  MFM OB LIMITED                     23184.98    GLORIS HUGGER ----------------------------------------------------------------------    #  Order #                     Accession #                Episode #  1  [id number]                   [id number]                 [id number] ---------------------------------------------------------------------- Indications    [redacted] weeks gestation of pregnancy                 Z3A.23  Preterm contractions                            O47.00  Premature rupture of membranes - leaking        O42.90  fluid  Poor obstetric history: Previous preterm        O09.219  delivery, antepartum  Premature rupture of membranes - leaking        O42.90  fluid ---------------------------------------------------------------------- Fetal Evaluation    Num Of Fetuses:          1  Fetal Heart Rate(bpm):   153  Cardiac Activity:        Observed  Presentation:            Breech  Placenta:                Anterior  P. Cord Insertion:       Visualized    Amniotic Fluid  AFI FV:      Oligohydramnios    AFI Sum(cm)     %Tile       Largest Pocket(cm)  1.2             < 3         1.2    RUQ(cm)  1.2 ---------------------------------------------------------------------- Biometry    LV:        4.6  mm ---------------------------------------------------------------------- OB History     Gravidity:    4         Term:   2        Prem:   1        SAB:   0  TOP:          0       Ectopic:  0        Living: 3 ---------------------------------------------------------------------- Gestational Age    LMP:           23w 1d        Date:  08/14/23                   EDD:   05/20/24  Best:          23w 1d     Det. By:  LMP  (08/14/23)          EDD:   05/20/24 ---------------------------------------------------------------------- Anatomy    Cranium:               Appears normal         Diaphragm:              Not well visualized  Ventricles:            Appears normal         Kidneys:                Not well visualized  Cerebellum:            Appears normal         Bladder:                Appears normal  Heart:                 Not well visualized ---------------------------------------------------------------------- Cervix Uterus Adnexa    Cervix  Length:              4  cm.  Normal appearance by transabdominal scan    Uterus  No abnormality visualized.    Right Ovary  Not visualized.    Left Ovary  Within normal limits. Not visualized.    Cul De Sac  No free fluid seen.    Adnexa  No abnormality visualized ---------------------------------------------------------------------- Impression    Limited exam to assess amniotic fluid in the context of  premature rupture of membranes  Good fetal movement and oligohydramnios observed. ---------------------------------------------------------------------- Recommendations    Clinical correlation recommended. I have reviewed the patient's current medications.  ASSESSMENT: Principal Problem:   Previable and prolonged preterm premature rupture of membranes (PPROM) since [redacted]w[redacted]d Active Problems:   Supervision of high risk pregnancy, antepartum   History of preterm delivery   History of postpartum gestational hypertension   Oligohydramnios antepartum, second trimester   Alpha thalassemia silent carrier   Trichomonal vaginitis in  pregnancy in first trimester   Chlamydia trachomatis infection in pregnancy   Lewis isoimmunization during pregnancy   Elevated AFP   Constipation during pregnancy in second trimester   History of preeclampsia, prior pregnancy, currently pregnant   History of postpartum hemorrhage   [redacted] weeks gestation of pregnancy   PLAN: Due to possible increased uterine activity, will place patient on the monitor for evaluation S/p BMZ x 2, latency abx continue, day 4/7 Low threshold for magnesium  sulfate if cramping continues Delivery at 34 weeks if possible NICU and MFM consults have been completed.   Continue routine antenatal care.   Jerilynn Buddle, MD [facility] Faculty Attending, Center for [facility] 01/24/2024 7:10 AM

## 2024-01-25 ENCOUNTER — Inpatient Hospital Stay (HOSPITAL_COMMUNITY): Admitting: Anesthesiology

## 2024-01-25 ENCOUNTER — Encounter (HOSPITAL_COMMUNITY)
Admission: AD | Disposition: A | Discharge status: Home / Self Care | Payer: Self-pay | Source: Home / Self Care | Attending: Obstetrics & Gynecology

## 2024-01-25 ENCOUNTER — Encounter (HOSPITAL_COMMUNITY): Payer: Self-pay | Admitting: Obstetrics & Gynecology

## 2024-01-25 DIAGNOSIS — Z3A23 23 weeks gestation of pregnancy: Secondary | ICD-10-CM

## 2024-01-25 DIAGNOSIS — O411230 Chorioamnionitis, third trimester, not applicable or unspecified: Secondary | ICD-10-CM

## 2024-01-25 DIAGNOSIS — O321XX0 Maternal care for breech presentation, not applicable or unspecified: Secondary | ICD-10-CM

## 2024-01-25 DIAGNOSIS — O135 Gestational [pregnancy-induced] hypertension without significant proteinuria, complicating the puerperium: Secondary | ICD-10-CM

## 2024-01-25 DIAGNOSIS — O42013 Preterm premature rupture of membranes, onset of labor within 24 hours of rupture, third trimester: Secondary | ICD-10-CM

## 2024-01-25 DIAGNOSIS — Z3A22 22 weeks gestation of pregnancy: Secondary | ICD-10-CM

## 2024-01-25 LAB — BPAM RBC
Blood Product Expiration Date: 202508102359
Blood Product Expiration Date: 202508102359
Unit Type and Rh: 5100
Unit Type and Rh: 5100

## 2024-01-25 LAB — TYPE AND SCREEN
ABO/RH(D): O POS
Antibody Screen: POSITIVE
Unit division: 0
Unit division: 0

## 2024-01-25 SURGERY — Surgical Case
Anesthesia: Spinal

## 2024-01-25 MED ORDER — SOD CITRATE-CITRIC ACID 500-334 MG/5ML PO SOLN
30.0000 mL | ORAL | Status: AC
  Administered 2024-01-25: 30 mL via ORAL

## 2024-01-25 MED ORDER — DIPHENHYDRAMINE HCL 50 MG/ML IJ SOLN
INTRAMUSCULAR | Status: DC | PRN
Start: 2024-01-25 — End: 2024-01-26
  Administered 2024-01-25: 6.25 mg via INTRAVENOUS

## 2024-01-25 MED ORDER — MIDAZOLAM HCL 2 MG/2ML IJ SOLN
INTRAMUSCULAR | Status: AC
  Filled 2024-01-25: qty 2

## 2024-01-25 MED ORDER — BUPIVACAINE IN DEXTROSE 0.75-8.25 % IT SOLN
INTRATHECAL | Status: DC | PRN
  Administered 2024-01-25: 1.6 mL via INTRATHECAL

## 2024-01-25 MED ORDER — ONDANSETRON HCL 4 MG/2ML IJ SOLN
INTRAMUSCULAR | Status: DC | PRN
Start: 2024-01-25 — End: 2024-01-26
  Administered 2024-01-25: 4 mg via INTRAVENOUS

## 2024-01-25 MED ORDER — FENTANYL CITRATE (PF) 100 MCG/2ML IJ SOLN
INTRAMUSCULAR | Status: AC
Start: 2024-01-25 — End: 2024-01-25
  Filled 2024-01-25: qty 2

## 2024-01-25 MED ORDER — MIDAZOLAM HCL 5 MG/5ML IJ SOLN
INTRAMUSCULAR | Status: DC | PRN
Start: 2024-01-25 — End: 2024-01-26
  Administered 2024-01-25 (×2): 1 mg via INTRAVENOUS

## 2024-01-25 MED ORDER — MORPHINE SULFATE (PF) 0.5 MG/ML IJ SOLN
INTRAMUSCULAR | Status: DC | PRN
  Administered 2024-01-25: 150 ug via INTRATHECAL

## 2024-01-25 MED ORDER — CEFAZOLIN SODIUM-DEXTROSE 2-4 GM/100ML-% IV SOLN: 2.0000 g | INTRAVENOUS | Status: DC

## 2024-01-25 MED ORDER — MORPHINE SULFATE (PF) 0.5 MG/ML IJ SOLN
INTRAMUSCULAR | Status: AC
  Filled 2024-01-25: qty 10

## 2024-01-25 MED ORDER — LACTATED RINGERS IV SOLN: INTRAVENOUS | Status: DC | PRN

## 2024-01-25 MED ORDER — FENTANYL CITRATE (PF) 100 MCG/2ML IJ SOLN
INTRAMUSCULAR | Status: DC | PRN
  Administered 2024-01-25: 15 ug via INTRATHECAL

## 2024-01-25 MED ORDER — CEFAZOLIN SODIUM-DEXTROSE 2-3 GM-%(50ML) IV SOLR
INTRAVENOUS | Status: DC | PRN
Start: 2024-01-25 — End: 2024-01-26
  Administered 2024-01-25: 2 g via INTRAVENOUS

## 2024-01-25 MED ORDER — OXYTOCIN-SODIUM CHLORIDE 30-0.9 UT/500ML-% IV SOLN
INTRAVENOUS | Status: DC | PRN
Start: 2024-01-25 — End: 2024-01-26
  Administered 2024-01-25: 300 mL via INTRAVENOUS

## 2024-01-25 MED ORDER — FENTANYL CITRATE (PF) 100 MCG/2ML IJ SOLN
INTRAMUSCULAR | Status: DC | PRN
  Administered 2024-01-25: 50 ug via INTRAVENOUS
  Administered 2024-01-25: 35 ug via INTRAVENOUS

## 2024-01-25 MED ORDER — SODIUM CHLORIDE 0.9 % IV SOLN
INTRAVENOUS | Status: DC | PRN
  Administered 2024-01-25: 500 mg via INTRAVENOUS

## 2024-01-25 MED ORDER — SOD CITRATE-CITRIC ACID 500-334 MG/5ML PO SOLN
ORAL | Status: AC
  Filled 2024-01-25: qty 30

## 2024-01-25 MED ORDER — BISACODYL 10 MG RE SUPP
10.0000 mg | Freq: Once | RECTAL | Status: DC
  Filled 2024-01-25: qty 1

## 2024-01-25 MED ORDER — DEXMEDETOMIDINE HCL IN NACL 80 MCG/20ML IV SOLN
INTRAVENOUS | Status: DC | PRN
  Administered 2024-01-25: 8 ug via INTRAVENOUS
  Administered 2024-01-25: 12 ug via INTRAVENOUS

## 2024-01-25 MED ORDER — PHENYLEPHRINE HCL-NACL 20-0.9 MG/250ML-% IV SOLN
INTRAVENOUS | Status: DC | PRN
  Administered 2024-01-25: 60 ug/min via INTRAVENOUS

## 2024-01-25 MED ORDER — TRANEXAMIC ACID-NACL 1000-0.7 MG/100ML-% IV SOLN
INTRAVENOUS | Status: DC | PRN
  Administered 2024-01-25: 1000 mg via INTRAVENOUS

## 2024-01-25 MED ORDER — DEXAMETHASONE SODIUM PHOSPHATE 10 MG/ML IJ SOLN
INTRAMUSCULAR | Status: DC | PRN
  Administered 2024-01-25: 10 mg via INTRAVENOUS

## 2024-01-25 MED ORDER — LACTATED RINGERS IV BOLUS
1000.0000 mL | Freq: Once | INTRAVENOUS | Status: AC
  Administered 2024-01-25: 1000 mL via INTRAVENOUS

## 2024-01-25 MED ORDER — SODIUM CHLORIDE 0.9 % IV SOLN: 500.0000 mg | INTRAVENOUS | Status: DC

## 2024-01-25 SURGICAL SUPPLY — 30 items
CHLORAPREP W/TINT 26 (MISCELLANEOUS) ×4 IMPLANT
CLAMP UMBILICAL CORD (MISCELLANEOUS) ×2 IMPLANT
DERMABOND ADVANCED .7 DNX12 (GAUZE/BANDAGES/DRESSINGS) IMPLANT
DRSG OPSITE POSTOP 4X10 (GAUZE/BANDAGES/DRESSINGS) ×2 IMPLANT
ELECTRODE REM PT RTRN 9FT ADLT (ELECTROSURGICAL) ×2 IMPLANT
EXTRACTOR VACUUM M CUP 4 TUBE (SUCTIONS) IMPLANT
GAUZE PAD ABD 7.5X8 STRL (GAUZE/BANDAGES/DRESSINGS) IMPLANT
GAUZE SPONGE 4X4 12PLY STRL LF (GAUZE/BANDAGES/DRESSINGS) IMPLANT
GLOVE BIOGEL PI IND STRL 6.5 (GLOVE) ×2 IMPLANT
GLOVE BIOGEL PI IND STRL 7.0 (GLOVE) ×2 IMPLANT
GLOVE SURG SS PI 6.5 STRL IVOR (GLOVE) ×2 IMPLANT
GOWN STRL REUS W/TWL LRG LVL3 (GOWN DISPOSABLE) ×4 IMPLANT
HEMOSTAT ARISTA ABSORB 3G PWDR (HEMOSTASIS) IMPLANT
KIT ABG SYR 3ML LUER SLIP (SYRINGE) IMPLANT
MAT PREVALON FULL STRYKER (MISCELLANEOUS) IMPLANT
NDL HYPO 25X5/8 SAFETYGLIDE (NEEDLE) IMPLANT
NEEDLE HYPO 25X5/8 SAFETYGLIDE (NEEDLE) ×2 IMPLANT
NS IRRIG 1000ML POUR BTL (IV SOLUTION) ×2 IMPLANT
PACK C SECTION WH (CUSTOM PROCEDURE TRAY) ×2 IMPLANT
PAD OB MATERNITY 4.3X12.25 (PERSONAL CARE ITEMS) ×2 IMPLANT
RETAINER VISCERAL (MISCELLANEOUS) IMPLANT
RTRCTR C-SECT PINK 25CM LRG (MISCELLANEOUS) IMPLANT
SUT PLAIN 0 NONE (SUTURE) IMPLANT
SUT PLAIN ABS 2-0 CT1 27XMFL (SUTURE) IMPLANT
SUT VIC AB 0 CT1 36 (SUTURE) ×8 IMPLANT
SUT VIC AB 2-0 CT1 TAPERPNT 27 (SUTURE) IMPLANT
SUT VIC AB 4-0 KS 27 (SUTURE) ×2 IMPLANT
TOWEL OR 17X24 6PK STRL BLUE (TOWEL DISPOSABLE) ×2 IMPLANT
TRAY FOLEY W/BAG SLVR 14FR LF (SET/KITS/TRAYS/PACK) ×2 IMPLANT
WATER STERILE IRR 1000ML POUR (IV SOLUTION) ×2 IMPLANT

## 2024-01-25 NOTE — Anesthesia Preprocedure Evaluation (Addendum)
 Anesthesia Evaluation  Patient identified by MRN, date of birth, ID band Patient awake    Reviewed: Allergy & Precautions, H&P , NPO status , Patient's Chart, lab work & pertinent test results  Airway Mallampati: II  TM Distance: >3 FB Neck ROM: Full    Dental no notable dental hx.    Pulmonary Current Smoker   Pulmonary exam normal breath sounds clear to auscultation       Cardiovascular hypertension, Normal cardiovascular exam Rhythm:Regular Rate:Normal     Neuro/Psych  PSYCHIATRIC DISORDERS  Depression    negative neurological ROS     GI/Hepatic Neg liver ROS,GERD  ,,  Endo/Other  negative endocrine ROS    Renal/GU negative Renal ROS  negative genitourinary   Musculoskeletal negative musculoskeletal ROS (+)    Abdominal   Peds negative pediatric ROS (+)  Hematology Hb 10.9, plt 267   Anesthesia Other Findings   Reproductive/Obstetrics (+) Pregnancy PPROM since 17weeks, now 23 3/7 weeks                              Anesthesia Physical Anesthesia Plan  ASA: 3  Anesthesia Plan: Spinal   Post-op Pain Management: Regional block, Ofirmev  IV (intra-op)* and Toradol  IV (intra-op)*   Induction:   PONV Risk Score and Plan: 3 and Ondansetron , Dexamethasone  and Treatment may vary due to age or medical condition  Airway Management Planned: Natural Airway and Nasal Cannula  Additional Equipment: None  Intra-op Plan:   Post-operative Plan:   Informed Consent: I have reviewed the patients History and Physical, chart, labs and discussed the procedure including the risks, benefits and alternatives for the proposed anesthesia with the patient or authorized representative who has indicated his/her understanding and acceptance.       Plan Discussed with: CRNA  Anesthesia Plan Comments: (+ antibody screen- has two units crossmatched and ready for her in blood bank Not appropriately NPO,  but ruptured and contracting- emergent case. Inc risk aspiration.)         Anesthesia Quick Evaluation

## 2024-01-25 NOTE — Anesthesia Procedure Notes (Signed)
 Spinal  Patient location during procedure: OR Start time: 01/25/2024 10:38 PM End time: 01/25/2024 10:41 PM Reason for block: surgical anesthesia Staffing Performed: anesthesiologist  Anesthesiologist: Merla Almarie HERO, DO Performed by: Merla Almarie HERO, DO Authorized by: Merla Almarie HERO, DO   Preanesthetic Checklist Completed: patient identified, IV checked, risks and benefits discussed, surgical consent, monitors and equipment checked, pre-op evaluation and timeout performed Spinal Block Patient position: sitting Prep: DuraPrep and site prepped and draped Patient monitoring: cardiac monitor, continuous pulse ox and blood pressure Approach: midline Location: L3-4 Injection technique: single-shot Needle Needle type: Pencan  Needle gauge: 24 G Needle length: 9 cm Assessment Sensory level: T6 Events: CSF return Additional Notes Functioning IV was confirmed and monitors were applied. Sterile prep and drape, including hand hygiene and sterile gloves were used. The patient was positioned and the spine was prepped. The skin was anesthetized with lidocaine .  Free flow of clear CSF was obtained prior to injecting local anesthetic into the CSF.  The spinal needle aspirated freely following injection.  The needle was carefully withdrawn.  The patient tolerated the procedure well.

## 2024-01-25 NOTE — Progress Notes (Signed)
 FACULTY PRACTICE ANTEPARTUM COMPREHENSIVE PROGRESS NOTE  [name] is a 33 y.o. [phone] at [redacted]w[redacted]d who is admitted for PPROM (since [redacted]w[redacted]d).  Estimated Date of Delivery: 05/20/24 Fetal presentation is breech.  Length of Stay:  4 Days. Admitted 01/21/2024  Subjective: Doing well this morning. Reports cramping/pain from yesterday is much improved. Has some spotting but much less than yesterday.    Vitals:  Blood pressure 120/70, pulse (!) 111, temperature 98.1 F (36.7 C), temperature source Oral, resp. rate 18, height 5' 7 (1.702 m), weight 83.3 kg, last menstrual period 08/14/2023, SpO2 100%. Physical Examination: CONSTITUTIONAL: Well-developed, well-nourished female in no acute distress.  NEUROLOGIC: Alert and oriented to person, place, and time. No cranial nerve deficit noted. PSYCHIATRIC: Normal mood and affect. Normal behavior. Normal judgment and thought content. CARDIOVASCULAR: Normal heart rate noted  RESPIRATORY: normal respiratory effort MUSCULOSKELETAL: Normal range of motion. No edema and no tenderness. 2+ distal pulses. ABDOMEN: Soft, nontender, nondistended, gravid. CERVIX:  deferred  Fetal monitoring: FHR: 150 bpm, Variability: moderate, Accelerations: Present, Decelerations: occasional variables (7/14) Uterine activity: quiet  Results for orders placed or performed during the hospital encounter of 01/21/24 (from the past 48 hours)  Type and screen [facility]     Status: None (Preliminary result)   Collection Time: 01/24/24 10:29 AM  Result Value Ref Range   ABO/RH(D) O POS    Antibody Screen POS    Sample Expiration 01/27/2024,2359    Antibody Identification      ANTI LEA Angelica a) ANTI LEB Angelica b) Performed at [facility], [address], [place], [place] [address]    Unit Number [id number]    Blood Component Type RED CELLS,LR    Unit division 00    Status of Unit ALLOCATED    Transfusion Status OK TO TRANSFUSE    Crossmatch  Result COMPATIBLE    Unit Number [id number]    Blood Component Type RED CELLS,LR    Unit division 00    Status of Unit ALLOCATED    Transfusion Status OK TO TRANSFUSE    Crossmatch Result COMPATIBLE   CBC with Differential/Platelet     Status: Abnormal   Collection Time: 01/24/24 10:29 AM  Result Value Ref Range   WBC 11.8 (H) 4.0 - 10.5 K/uL   RBC 4.06 3.87 - 5.11 MIL/uL   Hemoglobin 10.9 (L) 12.0 - 15.0 g/dL   HCT 66.3 (L) 63.9 - 53.9 %   MCV 82.8 80.0 - 100.0 fL   MCH 26.8 26.0 - 34.0 pg   MCHC 32.4 30.0 - 36.0 g/dL   RDW 74.9 (H) 88.4 - 84.4 %   Platelets 267 150 - 400 K/uL   nRBC 0.0 0.0 - 0.2 %   Neutrophils Relative % 61 %   Neutro Abs 7.2 1.7 - 7.7 K/uL   Lymphocytes Relative 34 %   Lymphs Abs 4.0 0.7 - 4.0 K/uL   Monocytes Relative 4 %   Monocytes Absolute 0.5 0.1 - 1.0 K/uL   Eosinophils Relative 1 %   Eosinophils Absolute 0.1 0.0 - 0.5 K/uL   Basophils Relative 0 %   Basophils Absolute 0.0 0.0 - 0.1 K/uL   WBC Morphology See Note     Comment: Morphology unremarkable   Smear Review See Note     Comment: Normal Platelet Morphology   nRBC 0 0 /100 WBC   Abs Immature Granulocytes 0.00 0.00 - 0.07 K/uL   Polychromasia PRESENT     Comment: Performed at  [facility], [address] [place], [place] [address]  Protime-INR     Status: None   Collection Time: 01/24/24 10:29 AM  Result Value Ref Range   Prothrombin Time 13.9 11.4 - 15.2 seconds   INR 1.0 0.8 - 1.2    Comment: (NOTE) INR goal varies based on device and disease states. Performed at [facility], [address], [place], [place] [address]   APTT     Status: None   Collection Time: 01/24/24 10:29 AM  Result Value Ref Range   aPTT 24 24 - 36 seconds    Comment: Performed at [facility], [address] [place], [place] [address]  Fibrinogen      Status: None   Collection Time: 01/24/24 10:29 AM  Result Value Ref Range   Fibrinogen  300 210 - 475 mg/dL    Comment:  (NOTE) Fibrinogen  results may be underestimated in patients receiving thrombolytic therapy. Performed at [facility], [address] [place], [place] [address]     No results found.  Current scheduled medications  amoxicillin   500 mg Oral TID   aspirin  EC  162 mg Oral Daily   Chlorhexidine  Gluconate Cloth  6 each Topical Daily   docusate sodium   100 mg Oral Daily   ferrous sulfate   325 mg Oral Daily   pantoprazole   20 mg Oral Daily   polyethylene glycol  17 g Oral Q72H   prenatal multivitamin  1 tablet Oral Q1200   sodium chloride  flush  10-40 mL Intracatheter Q12H   sodium chloride  flush  3-10 mL Intravenous Q12H    I have reviewed the patient's current medications.  ASSESSMENT: Principal Problem:   Previable and prolonged preterm premature rupture of membranes (PPROM) since [redacted]w[redacted]d Active Problems:   Supervision of high risk pregnancy, antepartum   History of preterm delivery   History of postpartum gestational hypertension   Oligohydramnios antepartum, second trimester   Alpha thalassemia silent carrier   Trichomonal vaginitis in pregnancy in first trimester   Chlamydia trachomatis infection in pregnancy   Lewis isoimmunization during pregnancy   Elevated AFP   Constipation during pregnancy in second trimester   History of preeclampsia, prior pregnancy, currently pregnant   History of postpartum hemorrhage   [redacted] weeks gestation of pregnancy   PLAN: PPROM:   - latency antibiotics 7/11  - BMZ 7/11-7/12  - s/p NICU consult  2.  FWB: NST reassuring for gestational age  - Last AFI 1.2 cm on 7/13  - Last growth 6/29 EFW 338 g (8%) - AFI twice weekly  - Growth q 3 weeks (next 7/18)  - s/p NICU & MFM consults  - daily NST, twice daily doptones  3. IUGR: Last growth 6/29 EFW 338 g (8%)  - Next growth 7/18, if still IUGR, will get dopplers  - Dr. Ileana plans to see and review plan of care with patient after ultrasound on 7/18  4. 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 History of chlamydia and trich: neg GC/Ch/Trich on 7/10  - retest late July and third trimester as previously planned  6. Elevated AFP  7. Mode of delivery: baby is currently BREECH, consented for emergency classical cesarean section on admission 7/11  Continue routine antenatal care.  Rollo ONEIDA Bring, MD, FACOG Obstetrician & Gynecologist, [facility] for [facility], [facility]

## 2024-01-25 NOTE — Op Note (Signed)
 Luceal M Pettis PROCEDURE DATE: 01/21/2024 - 01/25/2024  PREOPERATIVE DIAGNOSIS: Intrauterine pregnancy at  [redacted]w[redacted]d weeks gestation; chorioamnionitis and fetal malpresentation: frank breech  POSTOPERATIVE DIAGNOSIS: The same  PROCEDURE:     Cesarean Section  SURGEON:  Dr. Winton Felt  ASSISTANT: Dr. DELENA Sor  An experienced assistant was required given the standard of surgical care given the complexity of the case.  This assistant was needed for exposure, dissection, suctioning, retraction, instrument exchange,  assisting with delivery with administration of fundal pressure, and for overall help during the procedure.  INDICATIONS: CYRA SPADER is a 33 y.o. [id number] at [redacted]w[redacted]d scheduled for cesarean section secondary to chorioamnionitis and malpresentation: frank breech.  The risks of cesarean section discussed with the patient included but were not limited to: bleeding which may require transfusion or reoperation; infection which may require antibiotics; injury to bowel, bladder, ureters or other surrounding organs; injury to the fetus; need for additional procedures including hysterectomy in the event of a life-threatening hemorrhage; placental abnormalities wth subsequent pregnancies, incisional problems, thromboembolic phenomenon and other postoperative/anesthesia complications. The patient concurred with the proposed plan, giving informed written consent for the procedure.    FINDINGS:  Viable female infant in frank breech presentation.  Apgars and weight not available at the time f her note. Clear amniotic fluid. A 4 x 4 cm clot delivered prior to delivery of fetus and placenta.  Intact placenta, three vessel cord.  Normal uterus, fallopian tubes and ovaries bilaterally.  ANESTHESIA:    Spinal INTRAVENOUS FLUIDS:1800 ml ESTIMATED BLOOD LOSS: 528 ml URINE OUTPUT:  300 ml SPECIMENS: Placenta sent to pathology COMPLICATIONS: None immediate  PROCEDURE IN DETAIL:  The patient received  intravenous antibiotics and had sequential compression devices applied to her lower extremities while in the preoperative area.  She was then taken to the operating room where anesthesia was induced and was found to be adequate. A foley catheter was placed into her bladder and attached to Krista Som gravity. She was then placed in a dorsal supine position with a leftward tilt, and prepped and draped in a sterile manner. After an adequate timeout was performed, a Pfannenstiel skin incision was made with scalpel and carried through to the underlying layer of fascia. The fascia was incised in the midline and this incision was extended bilaterally bluntly.  The rectus muscles were separated in the midline bluntly and the peritoneum was entered bluntly. The Alexis self-retaining retractor was introduced into the abdominal cavity. Attention was turned to the lower uterine segment where a transverse hysterotomy was made with a scalpel and extended bilaterally bluntly. The infant was successfully delivered, and cord was clamped and cut and infant was handed over to awaiting neonatology team. Uterine massage was then administered. After a segment of cord was cut for gas collection, cord evolution was noted. The placenta was manually delivered intact with three-vessel cord. The uterus was cleared of clot and debris.  The hysterotomy was closed with 0 Vicryl in a running locked fashion, and an imbricating layer was also placed with a 0 Vicryl. Overall, excellent hemostasis was noted. Arista was applied over the hysterotomy to ensure persistence of hemostasis. The pelvis was cleared of all clot and debris. Hemostasis was confirmed on all surfaces.  The peritoneum and the muscles were reapproximated using 0 vicryl interrupted stitches. The fascia was then closed using 0 Vicryl in a running fashion.  The subcutaneous layer was reapproximated with plain gut and the skin was closed in a subcuticular fashion using 3.0 Vicryl.  The  patient tolerated the procedure well. Sponge, lap, instrument and needle counts were correct x 2. She was taken to the recovery room in stable condition.    Iniya Matzek ConstantMD  01/25/2024 11:41 PM

## 2024-01-25 NOTE — Progress Notes (Signed)
 Called at patient bedside for evaluation of patient complaining of pain. Patient is visibly uncomfortable. Patient reports intermittent contractions every 3-4 minutes.   Blood pressure 110/61, pulse 93, temperature 98.1 F (36.7 C), temperature source Oral, resp. rate 18, height 5' 7 (1.702 m), weight 83.3 kg, last menstrual period 08/14/2023, SpO2 98%. GENERAL: Well-developed, well-nourished female in no acute distress.  ABDOMEN: Soft, mild abdominal tenderness, gravid PELVIC: not performed EXTREMITIES: No cyanosis, clubbing, or edema, 2+ distal pulses.  FHT: baseline 175, min variability, no accels, no decels Toco: not picking up contractions but patient visibly uncomfortable every 3 minutes  A/P 33 yo P3 with previable PPROM since 17 weeks now 23.3 weeks in labor with fetal tachycardia - Given signs of labor and fetal tachycardia, recommend delivery for chorioamnionitis - fetus in breech presentation- will proceed with cesarean delivery. Risks, benefits and alternatives were explained including but not limited to risks of bleeding, infection and damage to adjacent organs. Patient verbalized understanding and all questions were answered

## 2024-01-26 ENCOUNTER — Other Ambulatory Visit: Payer: Self-pay

## 2024-01-26 ENCOUNTER — Encounter (HOSPITAL_COMMUNITY): Payer: Self-pay | Admitting: Obstetrics & Gynecology

## 2024-01-26 DIAGNOSIS — Z98891 History of uterine scar from previous surgery: Principal | ICD-10-CM

## 2024-01-26 LAB — CREATININE, SERUM
Creatinine, Ser: 0.58 mg/dL (ref 0.44–1.00)
GFR, Estimated: >60 mL/min

## 2024-01-26 LAB — CBC
HCT: 31.7 % — ABNORMAL LOW (ref 36.0–46.0)
Hemoglobin: 10.1 g/dL — ABNORMAL LOW (ref 12.0–15.0)
MCH: 26.8 pg (ref 26.0–34.0)
MCHC: 31.9 g/dL (ref 30.0–36.0)
MCV: 84.1 fL (ref 80.0–100.0)
Platelets: 239 K/uL (ref 150–400)
RBC: 3.77 MIL/uL — ABNORMAL LOW (ref 3.87–5.11)
RDW: 23.8 % — ABNORMAL HIGH (ref 11.5–15.5)
WBC: 10.3 K/uL (ref 4.0–10.5)
nRBC: 0 % (ref 0.0–0.2)

## 2024-01-26 MED ORDER — ONDANSETRON HCL 4 MG/2ML IJ SOLN: 4.0000 mg | Freq: Three times a day (TID) | INTRAMUSCULAR | Status: DC | PRN

## 2024-01-26 MED ORDER — NALOXONE HCL 0.4 MG/ML IJ SOLN: 0.4000 mg | INTRAMUSCULAR | Status: DC | PRN

## 2024-01-26 MED ORDER — DIPHENHYDRAMINE HCL 50 MG/ML IJ SOLN: 12.5000 mg | INTRAMUSCULAR | Status: DC | PRN

## 2024-01-26 MED ORDER — SCOPOLAMINE 1 MG/3DAYS TD PT72
1.0000 | MEDICATED_PATCH | Freq: Once | TRANSDERMAL | Status: DC
  Administered 2024-01-26: 1.5 mg via TRANSDERMAL
  Filled 2024-01-26: qty 1

## 2024-01-26 MED ORDER — OXYCODONE HCL 5 MG PO TABS: 5.0000 mg | ORAL_TABLET | Freq: Once | ORAL | Status: DC | PRN

## 2024-01-26 MED ORDER — KETOROLAC TROMETHAMINE 30 MG/ML IJ SOLN: 30.0000 mg | Freq: Four times a day (QID) | INTRAMUSCULAR | Status: DC | PRN

## 2024-01-26 MED ORDER — COCONUT OIL OIL: 1.0000 | TOPICAL_OIL | Status: DC | PRN

## 2024-01-26 MED ORDER — IBUPROFEN 600 MG PO TABS
600.0000 mg | ORAL_TABLET | Freq: Four times a day (QID) | ORAL | Status: DC
  Administered 2024-01-26 – 2024-01-28 (×10): 600 mg via ORAL
  Filled 2024-01-26 (×10): qty 1

## 2024-01-26 MED ORDER — SODIUM CHLORIDE 0.9% FLUSH: 3.0000 mL | INTRAVENOUS | Status: DC | PRN

## 2024-01-26 MED ORDER — ENOXAPARIN SODIUM 40 MG/0.4ML IJ SOSY
40.0000 mg | PREFILLED_SYRINGE | INTRAMUSCULAR | Status: DC
  Administered 2024-01-26 – 2024-01-27 (×2): 40 mg via SUBCUTANEOUS
  Filled 2024-01-26 (×2): qty 0.4

## 2024-01-26 MED ORDER — MENTHOL 3 MG MT LOZG: 1.0000 | LOZENGE | OROMUCOSAL | Status: DC | PRN

## 2024-01-26 MED ORDER — HYDROMORPHONE HCL 1 MG/ML IJ SOLN: 0.2500 mg | INTRAMUSCULAR | Status: DC | PRN

## 2024-01-26 MED ORDER — SENNOSIDES-DOCUSATE SODIUM 8.6-50 MG PO TABS
2.0000 | ORAL_TABLET | Freq: Every day | ORAL | Status: DC
  Administered 2024-01-26 – 2024-01-28 (×3): 2 via ORAL
  Filled 2024-01-26 (×3): qty 2

## 2024-01-26 MED ORDER — ZOLPIDEM TARTRATE 5 MG PO TABS
5.0000 mg | ORAL_TABLET | Freq: Every evening | ORAL | Status: DC | PRN
  Administered 2024-01-26 – 2024-01-28 (×2): 5 mg via ORAL
  Filled 2024-01-26 (×2): qty 1

## 2024-01-26 MED ORDER — SIMETHICONE 80 MG PO CHEW: 80.0000 mg | CHEWABLE_TABLET | ORAL | Status: DC | PRN

## 2024-01-26 MED ORDER — DIPHENHYDRAMINE HCL 25 MG PO CAPS: 25.0000 mg | ORAL_CAPSULE | ORAL | Status: DC | PRN

## 2024-01-26 MED ORDER — WITCH HAZEL-GLYCERIN EX PADS: 1.0000 | MEDICATED_PAD | CUTANEOUS | Status: DC | PRN

## 2024-01-26 MED ORDER — LACTATED RINGERS IV SOLN: INTRAVENOUS | Status: AC

## 2024-01-26 MED ORDER — AMISULPRIDE (ANTIEMETIC) 5 MG/2ML IV SOLN: 10.0000 mg | Freq: Once | INTRAVENOUS | Status: DC | PRN

## 2024-01-26 MED ORDER — SIMETHICONE 80 MG PO CHEW
80.0000 mg | CHEWABLE_TABLET | Freq: Three times a day (TID) | ORAL | Status: DC
  Administered 2024-01-26 – 2024-01-28 (×7): 80 mg via ORAL
  Filled 2024-01-26 (×7): qty 1

## 2024-01-26 MED ORDER — OXYTOCIN-SODIUM CHLORIDE 30-0.9 UT/500ML-% IV SOLN: 2.5000 [IU]/h | INTRAVENOUS | Status: AC

## 2024-01-26 MED ORDER — OXYCODONE HCL 5 MG/5ML PO SOLN: 5.0000 mg | Freq: Once | ORAL | Status: DC | PRN

## 2024-01-26 MED ORDER — ACETAMINOPHEN 500 MG PO TABS
1000.0000 mg | ORAL_TABLET | Freq: Four times a day (QID) | ORAL | Status: AC
  Administered 2024-01-26 (×2): 1000 mg via ORAL
  Filled 2024-01-26 (×2): qty 2

## 2024-01-26 MED ORDER — MEPERIDINE HCL 25 MG/ML IJ SOLN: 6.2500 mg | INTRAMUSCULAR | Status: DC | PRN

## 2024-01-26 MED ORDER — KETOROLAC TROMETHAMINE 30 MG/ML IJ SOLN: 30.0000 mg | Freq: Once | INTRAMUSCULAR | Status: DC | PRN

## 2024-01-26 MED ORDER — DIPHENHYDRAMINE HCL 25 MG PO CAPS: 25.0000 mg | ORAL_CAPSULE | Freq: Four times a day (QID) | ORAL | Status: DC | PRN

## 2024-01-26 MED ORDER — NALOXONE HCL 4 MG/10ML IJ SOLN: 1.0000 ug/kg/h | INTRAVENOUS | Status: DC | PRN

## 2024-01-26 MED ORDER — DIBUCAINE (PERIANAL) 1 % EX OINT: 1.0000 | TOPICAL_OINTMENT | CUTANEOUS | Status: DC | PRN

## 2024-01-26 MED ORDER — PRENATAL MULTIVITAMIN CH
1.0000 | ORAL_TABLET | Freq: Every day | ORAL | Status: DC
  Administered 2024-01-26 – 2024-01-28 (×3): 1 via ORAL
  Filled 2024-01-26 (×3): qty 1

## 2024-01-26 MED ORDER — OXYCODONE-ACETAMINOPHEN 5-325 MG PO TABS
1.0000 | ORAL_TABLET | ORAL | Status: DC | PRN
  Administered 2024-01-26 – 2024-01-27 (×7): 2 via ORAL
  Administered 2024-01-27: 1 via ORAL
  Administered 2024-01-28 (×2): 2 via ORAL
  Filled 2024-01-26 (×10): qty 2

## 2024-01-26 MED ORDER — ONDANSETRON HCL 4 MG/2ML IJ SOLN: 4.0000 mg | Freq: Once | INTRAMUSCULAR | Status: DC | PRN

## 2024-01-26 NOTE — Anesthesia Postprocedure Evaluation (Signed)
 Anesthesia Post Note  Patient: [name]  Procedure(s) Performed: CESAREAN DELIVERY     Patient location during evaluation: PACU Anesthesia Type: Spinal Level of consciousness: awake and alert and oriented Pain management: pain level controlled Vital Signs Assessment: post-procedure vital signs reviewed and stable Respiratory status: spontaneous breathing, nonlabored ventilation and respiratory function stable Cardiovascular status: blood pressure returned to baseline and stable Postop Assessment: no headache, no backache, spinal receding, patient able to bend at knees and no apparent nausea or vomiting Anesthetic complications: no   No notable events documented.  Last Vitals:  Vitals:   01/26/24 0100 01/26/24 0103  BP:  (!) 124/92  Pulse: 100 (!) 101  Resp: (!) 22 (!) 21  Temp:    SpO2: 100% 100%    Last Pain:  Vitals:   01/26/24 0008  TempSrc: Axillary  PainSc:    Pain Goal:                Epidural/Spinal Function Cutaneous sensation: Tingles (01/26/24 0045), Patient able to flex knees: Yes (01/26/24 0045), Patient able to lift hips off bed: Yes (01/26/24 0045), Back pain beyond tenderness at insertion site: No (01/26/24 0045), Progressively worsening motor and/or sensory loss: No (01/26/24 0045), Bowel and/or bladder incontinence post epidural: No (01/26/24 0045)  [name]

## 2024-01-26 NOTE — Progress Notes (Signed)
 Subjective: Postpartum Day 1: Cesarean Delivery Patient reports incisional pain this morning. She ambulated in her room. She denies chest pain, shortness of breath or dizziness. Patient is tolerating a regular diet. Foley is still in place    Objective: Vital signs in last 24 hours: Temp:  [98.1 F (36.7 C)-99.4 F (37.4 C)] 98.4 F (36.9 C) (07/16 0428) Pulse Rate:  [80-126] 93 (07/16 0624) Resp:  [16-24] 16 (07/16 0624) BP: (110-144)/(61-127) 112/82 (07/16 0624) SpO2:  [98 %-100 %] 100 % (07/16 0428)  Physical Exam:  General: alert, cooperative, and no distress Lochia: appropriate Uterine Fundus: firm Incision: Pressure dressing in place DVT Evaluation: No evidence of DVT seen on physical exam.  Recent Labs    01/24/24 1029  HGB 10.9*  HCT 33.6*    Assessment/Plan: Status post Cesarean section. Doing well postoperatively.  Continue current care Will plan to discontinue foley Encourage ambulation Patient called to NICU while being seen by me this morning. Will plan to remove dressing at a later time today or tomorrow morning.  Winton Felt, MD 01/26/2024, 7:20 AM

## 2024-01-26 NOTE — Progress Notes (Signed)
 Patient seen and evaluated. Coping well with loss of infant. Has seen chaplain. SW consult ordered. Pressure dressing removed. Support given.   Rollo ONEIDA Bring, MD, FACOG Obstetrician & Gynecologist, [facility] for [facility], [facility]

## 2024-01-26 NOTE — Discharge Summary (Signed)
 Postpartum Discharge Summary  Date of Service updated***     Patient Name: [name] DOB: 12/13/1990 MRN: [id number]  Date of admission: 01/21/2024 Delivery date:01/25/2024 Delivering provider: CONSTANT, PEGGY Date of discharge: 01/26/2024  Admitting diagnosis: Preterm premature rupture of membranes (PPROM) with onset of labor after 24 hours of rupture in second trimester, antepartum [O42.112] Intrauterine pregnancy: [redacted]w[redacted]d     Secondary diagnosis:  Principal Problem:   S/P cesarean section Active Problems:   Supervision of high risk pregnancy, antepartum   History of preterm delivery   History of postpartum gestational hypertension   Oligohydramnios antepartum, second trimester   Alpha thalassemia silent carrier   Trichomonal vaginitis in pregnancy in first trimester   Chlamydia trachomatis infection in pregnancy   Lewis isoimmunization during pregnancy   Elevated AFP   Constipation during pregnancy in second trimester   Previable and prolonged preterm premature rupture of membranes (PPROM) since [redacted]w[redacted]d   History of preeclampsia, prior pregnancy, currently pregnant   History of postpartum hemorrhage   [redacted] weeks gestation of pregnancy  Additional problems: ***    Discharge diagnosis: Preterm Pregnancy Delivered and PPROM                                              Post partum procedures:{Postpartum procedures:23558} Augmentation: N/A Complications: {OB Labor/Delivery Complications:20784}  Hospital course: Onset of Labor With Unplanned C/S   33 y.o. yo [id number] at [redacted]w[redacted]d was admitted for PPROM at 17w, admitted at [redacted]w[redacted]d for course of BMZ, expectant management following, noted to have fetal tachycardia and evidence of labor on 01/26/24 so decision made to proceed w primarly LTCS. Delivery details as follows: Membrane Rupture Time/Date:  ,   Delivery Method:C-Section, Low Vertical Operative Delivery:N/A Details of operation can be found in separate operative note. Patient  had a postpartum course complicated by***.  She is ambulating,tolerating a regular diet, passing flatus, and urinating well.  Patient is discharged home in stable condition 01/26/24.  Newborn Data: Birth date:01/25/2024 Birth time:11:04 PM Gender:Female Living status:Living Apgars: ,  Weight:490 g  Magnesium  Sulfate received: {Mag received:30440022} BMZ received: Yes Rhophylac:N/A MMR:N/A T-DaP:*** Flu: No RSV Vaccine received: No Transfusion:{Transfusion received:30440034}  Immunizations received: Immunization History  Administered Date(s) Administered   PFIZER(Purple Top)SARS-COV-2 Vaccination 02/20/2020   Tdap 12/12/2011    Physical exam  Vitals:   01/25/24 2003 01/26/24 0008 01/26/24 0015 01/26/24 0030  BP: 110/61 (!) 144/93 128/89 131/88  Pulse: 93 (!) 126 (!) 112 (!) 122  Resp: 18 18 20 20   Temp: 98.1 F (36.7 C) 98.1 F (36.7 C)    TempSrc: Oral Axillary    SpO2: 98% 100%  100%  Weight:      Height:       General: {Exam; general:21111117} Lochia: {Desc; appropriate/inappropriate:30686::appropriate} Uterine Fundus: {Desc; firm/soft:30687} Incision: {Exam; incision:21111123} DVT Evaluation: {Exam; dvt:2111122} Labs: Lab Results  Component Value Date   WBC 11.8 (H) 01/24/2024   HGB 10.9 (L) 01/24/2024   HCT 33.6 (L) 01/24/2024   MCV 82.8 01/24/2024   PLT 267 01/24/2024      Latest Ref Rng & Units 01/21/2024   12:01 PM  CMP  Glucose 70 - 99 mg/dL 74   BUN 6 - 20 mg/dL <5   Creatinine 9.55 - 1.00 mg/dL 9.43   Sodium 864 - 854 mmol/L 136   Potassium 3.5 - 5.1 mmol/L 3.7  Chloride 98 - 111 mmol/L 106   CO2 22 - 32 mmol/L 20   Calcium  8.9 - 10.3 mg/dL 9.3   Total Protein 6.5 - 8.1 g/dL 6.5   Total Bilirubin 0.0 - 1.2 mg/dL 0.7   Alkaline Phos 38 - 126 U/L 46   AST 15 - 41 U/L 25   ALT 0 - 44 U/L 27    Edinburgh Score:     No data to display         No data recorded  After visit meds:  Allergies as of 01/26/2024   No Known Allergies    Med Rec must be completed prior to using this [facility]***        Discharge home in stable condition Infant Feeding: Both Infant Disposition:{CHL IP OB HOME WITH FNUYZM:76418} Discharge instruction: per After Visit Summary and Postpartum booklet. Activity: Advance as tolerated. Pelvic rest for 6 weeks.  Diet: {OB ipzu:78888878} Future Appointments: Future Appointments  Date Time Provider Department Center  02/15/2024  8:20 AM [facility]  02/15/2024  8:55 AM Nicholaus Burnard HERO, MD [facility]  02/29/2024  1:15 PM Nicholaus Burnard HERO, MD [facility]   Follow up Visit: Message sent to [facility] 7/16  Please schedule this patient for a In person postpartum visit in 6 weeks with the following provider: Any provider. Additional Postpartum F/U:Postpartum Depression checkup and Incision check 1 week  High risk pregnancy complicated by: PPROM at 17wk Delivery mode:  C-Section, Low Vertical Anticipated Birth Control:  Unsure   01/26/2024 Alain Sor, MD

## 2024-01-26 NOTE — Transfer of Care (Signed)
 Immediate Anesthesia Transfer of Care Note  Patient: [name]  Procedure(s) Performed: CESAREAN DELIVERY  Patient Location: PACU  Anesthesia Type:Spinal  Level of Consciousness: awake, alert , and oriented  Airway & Oxygen Therapy: Patient Spontanous Breathing  Post-op Assessment: Report given to RN and Post -op Vital signs reviewed and stable  Post vital signs: Reviewed and stable  Last Vitals:  Vitals Value Taken Time  BP 131/88 01/26/24 00:30  Temp    Pulse 109 01/26/24 00:35  Resp 20 01/26/24 00:35  SpO2 100 % 01/26/24 00:35  Vitals shown include unfiled device data.  Last Pain:  Vitals:   01/26/24 0008  TempSrc: Axillary  PainSc:          Complications: No notable events documented.

## 2024-01-27 ENCOUNTER — Encounter (HOSPITAL_COMMUNITY): Payer: Self-pay | Admitting: Obstetrics & Gynecology

## 2024-01-27 LAB — SURGICAL PATHOLOGY

## 2024-01-27 MED ORDER — LORAZEPAM 1 MG PO TABS
1.0000 mg | ORAL_TABLET | Freq: Four times a day (QID) | ORAL | Status: DC | PRN
  Administered 2024-01-27 (×2): 1 mg via ORAL
  Filled 2024-01-27 (×2): qty 1

## 2024-01-27 MED ORDER — CYCLOBENZAPRINE HCL 10 MG PO TABS
10.0000 mg | ORAL_TABLET | Freq: Three times a day (TID) | ORAL | Status: DC | PRN
  Administered 2024-01-27: 10 mg via ORAL
  Filled 2024-01-27: qty 1

## 2024-01-27 NOTE — Progress Notes (Signed)
 Brief follow up visit with [name] in her hospital room. She had baby [name] in the room with her and her daughter was holding him. Chaplain assisted in taking photos of the siblings together. [name] shared that [name]'s ultrasound is in the morning and they are feeling hopeful about finding out the baby's sex alongside of their tremendous grief about baby [name]'s death. We briefly discussed disposition of baby's body and [name] shared that she was leaning toward cremation so they could all have a piece of him with them.  Chaplain provided resources for local options for discounted disposition planning and we made a plan to discuss further on Thursday.  Please page as further needs arise.  Alan HERO. Davee Lomax, M.Div. [facility] Chaplain Pager [phone] Office [phone]

## 2024-01-27 NOTE — Progress Notes (Signed)
 Brief follow up with Berklee in her room. Chaplain informed pt that I would follow up this afternoon regarding disposition plans and financial support per our conversation yesterday. She was napping at the moment and agreeable to the plan.  Please page as further needs arise.  Alan HERO. Davee Lomax, M.Div. [facility] Chaplain Pager [phone] Office [phone]

## 2024-01-27 NOTE — Progress Notes (Incomplete)
 CSW followed up with MOB at bedside, MOB accompanied by her daughter. CSW introduced self and explained role. CSW offered condolences for MOB's loss. MOB was visibly upset and tearful. CSW provided emotional support. CSW and MOB discussed the loss of infant and the importance of caring for self. MOB verbalized interest in mental health resources and endorsed a history of depression, anxiety, and ADHD. CSW informed MOB about the option to see an Agricultural engineer through her OBGYN office, MOB reported that she was interested and agreed able to a referral. CSW agreed to complete a referral. MOB endorsed depressive symptoms during her pregnancy, noting external stressors with issues with FOB were causing depression. CSW inquired about how MOB was coping with depressive symptoms, MOB reported that she cried and isolated herself to cope. CSW acknowledged the benefits of support during difficult times like these. MOB reported that her mom and daughter are supports. CSW positively affirmed MOB's support system and encouraged MOB to rely on her supports during this time. CSW also encouraged MOB to follow up with mental health resources as it can be helpful to process emotions and learn healthy coping skills. CSW inquired about MOB's interest in psychotropic medications to assist with symptoms, MOB reported that she was interested. CSW agreed to notify MOB's RN. MOB was tearful and did not demonstrate any acute mental health signs/symptoms. CSW assessed for safety, MOB denied SI, HI, and domestic violence.   CSW provided education regarding the baby blues period vs. perinatal mood disorders, discussed treatment and gave resources for mental health follow up if concerns arise.  CSW recommends self-evaluation during the postpartum time period using the Perinatal Mental Health Discussion Tool for Parents Experiencing Loss and encouraged MOB to contact a medical professional if symptoms are noted at any  time.    MOB shared that she is currently staying at different places and needs assistance with securing permanent housing. CSW agreed to provide local housing resources. CSW inquired about any additional needs for resources/supports. MOB verbalized needing assistance with support for an urn for infant's remains. MOB reported that her family will assist her with cremation cost as she plans to utilize [employer] which will cost approximately $50. CSW provided information about the [employer] and [employer]. MOB verbalized a plan to apply for the [employer]. MOB was experiencing nausea, CSW agreed to check with MOB's RN about medication for nausea.   CSW followed up with RN and provided an update that MOB requested medication for nausea. RN agreed to provide.   CSW returned to room to provide resources, Chaplin was present and reported that she was about to leave the room. Neonatologist entered the room to follow up with MOB. MOB was experiencing nausea, CSW agreed to check in with MOB in the morning as she was visibly sick and has a lot going on. MOB agreeable and thanked CSW.   CSW updated MOB's RN of support provided, plan to follow up tomorrow, and MOB's interest in psychotropic medication.   Suzen Law, LCSW Clinical Social Worker [facility] Cell#: [phone]

## 2024-01-27 NOTE — Progress Notes (Signed)
 Follow up visit with [name] and her teenage daugther [name] at [facility] bedside. [name] is in the room with family. Chaplain brought baptism certificate and bible from yesterday's baptism as well as information for our grief support group. Chaplain facilitated emotional expression and story telling and provided grief education as well as support and encouragement regarding self care practices. We discussed the family's plans for cremation with [employer] and possible financial support through [employer]. LCSW Suzen Law will continue to assist [name] with that process and had arrived for follow up as chaplain was exiting the room.  Please page as further needs arise.  Alan HERO. Davee Lomax, M.Div. [facility] Chaplain Pager [phone] Office [phone]

## 2024-01-27 NOTE — Progress Notes (Signed)
 Subjective: Postpartum Day 2 technically delivery 2304: Cesarean Delivery Patient reports incisional pain, tolerating PO, and no problems voiding.    Objective: Vital signs in last 24 hours: Temp:  [97.7 F (36.5 C)-98.3 F (36.8 C)] 98.1 F (36.7 C) (07/17 0518) Pulse Rate:  [85-100] 100 (07/17 0518) Resp:  [16-18] 16 (07/17 0518) BP: (108-146)/(64-104) 118/81 (07/17 0518) SpO2:  [98 %-100 %] 99 % (07/17 0518)  Physical Exam:  General: alert, cooperative, and no distress Lochia: appropriate Uterine Fundus: firm Incision: healing well, no significant drainage, no dehiscence, no significant erythema DVT Evaluation: No evidence of DVT seen on physical exam.  Recent Labs    01/24/24 1029 01/26/24 1634  HGB 10.9* 10.1*  HCT 33.6* 31.7*    Assessment/Plan: Status post Cesarean section. Normal operatively Of course struggling emotionally  Concerned about cremation expense since she has not been working since she experienced P/PROM.  Vonn VEAR Inch, MD 01/27/2024, 7:21 AM

## 2024-01-27 NOTE — Progress Notes (Signed)
 CSW acknowledged consult and attempted to meet with MOB at bedside; however, MOB reported that she was just falling asleep and requested that CSW return at a later time. CSW will attempt to meet with MOB at a later time.  Suzen Law, LCSW Clinical Social Worker [facility] Cell#: [phone]

## 2024-01-28 ENCOUNTER — Other Ambulatory Visit (HOSPITAL_COMMUNITY): Payer: Self-pay

## 2024-01-28 DIAGNOSIS — F4321 Adjustment disorder with depressed mood: Secondary | ICD-10-CM | POA: Diagnosis not present

## 2024-01-28 DIAGNOSIS — O165 Unspecified maternal hypertension, complicating the puerperium: Secondary | ICD-10-CM | POA: Diagnosis not present

## 2024-01-28 LAB — BASIC METABOLIC PANEL WITH GFR
Anion gap: 7 (ref 5–15)
BUN: <5 mg/dL — ABNORMAL LOW (ref 6–20)
CO2: 24 mmol/L (ref 22–32)
Calcium: 8.7 mg/dL — ABNORMAL LOW (ref 8.9–10.3)
Chloride: 109 mmol/L (ref 98–111)
Creatinine, Ser: 0.49 mg/dL (ref 0.44–1.00)
GFR, Estimated: >60 mL/min
Glucose, Bld: 103 mg/dL — ABNORMAL HIGH (ref 70–99)
Potassium: 3.7 mmol/L (ref 3.5–5.1)
Sodium: 140 mmol/L (ref 135–145)

## 2024-01-28 LAB — BPAM RBC
Blood Product Expiration Date: 202508112359
Blood Product Expiration Date: 202508112359
Unit Type and Rh: 5100
Unit Type and Rh: 5100

## 2024-01-28 LAB — TYPE AND SCREEN
ABO/RH(D): O POS
Antibody Screen: POSITIVE
Unit division: 0
Unit division: 0

## 2024-01-28 MED ORDER — ACETAMINOPHEN 500 MG PO TABS
1000.0000 mg | ORAL_TABLET | Freq: Three times a day (TID) | ORAL | 0 refills | Status: AC
  Filled 2024-01-28: qty 60, 10d supply, fill #0

## 2024-01-28 MED ORDER — ESCITALOPRAM OXALATE 10 MG PO TABS
5.0000 mg | ORAL_TABLET | Freq: Every day | ORAL | Status: DC
  Administered 2024-01-28: 5 mg via ORAL
  Filled 2024-01-28: qty 1

## 2024-01-28 MED ORDER — ESCITALOPRAM OXALATE 5 MG PO TABS
ORAL_TABLET | ORAL | 0 refills | Status: AC
  Filled 2024-01-28: qty 54, 30d supply, fill #0

## 2024-01-28 MED ORDER — FUROSEMIDE 20 MG PO TABS
20.0000 mg | ORAL_TABLET | Freq: Every day | ORAL | 0 refills | Status: AC
  Filled 2024-01-28: qty 5, 5d supply, fill #0

## 2024-01-28 MED ORDER — HYDROXYZINE HCL 25 MG PO TABS
25.0000 mg | ORAL_TABLET | Freq: Four times a day (QID) | ORAL | 2 refills | Status: AC | PRN
  Filled 2024-01-28: qty 30, 8d supply, fill #0

## 2024-01-28 MED ORDER — IBUPROFEN 600 MG PO TABS
600.0000 mg | ORAL_TABLET | Freq: Four times a day (QID) | ORAL | 0 refills | Status: AC
  Filled 2024-01-28: qty 30, 8d supply, fill #0

## 2024-01-28 MED ORDER — POTASSIUM CHLORIDE CRYS ER 20 MEQ PO TBCR
20.0000 meq | EXTENDED_RELEASE_TABLET | Freq: Every day | ORAL | 0 refills | Status: AC
  Filled 2024-01-28: qty 5, 5d supply, fill #0

## 2024-01-28 MED ORDER — SENNOSIDES-DOCUSATE SODIUM 8.6-50 MG PO TABS
2.0000 | ORAL_TABLET | Freq: Every day | ORAL | 0 refills | Status: AC
  Filled 2024-01-28: qty 30, 15d supply, fill #0

## 2024-01-28 MED ORDER — OXYCODONE HCL 5 MG PO TABS
5.0000 mg | ORAL_TABLET | ORAL | 0 refills | Status: AC | PRN
  Filled 2024-01-28: qty 12, 2d supply, fill #0

## 2024-01-28 NOTE — Plan of Care (Signed)
  Problem: Education: Goal: Knowledge of General Education information will improve Description: Including pain rating scale, medication(s)/side effects and non-pharmacologic comfort measures Outcome: Adequate for Discharge   Problem: Health Behavior/Discharge Planning: Goal: Ability to manage health-related needs will improve Outcome: Adequate for Discharge   Problem: Clinical Measurements: Goal: Ability to maintain clinical measurements within normal limits will improve Outcome: Adequate for Discharge Goal: Will remain free from infection Outcome: Adequate for Discharge Goal: Diagnostic test results will improve Outcome: Adequate for Discharge Goal: Respiratory complications will improve Outcome: Adequate for Discharge Goal: Cardiovascular complication will be avoided Outcome: Adequate for Discharge   Problem: Activity: Goal: Risk for activity intolerance will decrease Outcome: Adequate for Discharge   Problem: Nutrition: Goal: Adequate nutrition will be maintained Outcome: Adequate for Discharge   Problem: Coping: Goal: Level of anxiety will decrease Outcome: Adequate for Discharge   Problem: Elimination: Goal: Will not experience complications related to bowel motility Outcome: Adequate for Discharge Goal: Will not experience complications related to urinary retention Outcome: Adequate for Discharge   Problem: Pain Managment: Goal: General experience of comfort will improve and/or be controlled Outcome: Adequate for Discharge   Problem: Safety: Goal: Ability to remain free from injury will improve Outcome: Adequate for Discharge   Problem: Skin Integrity: Goal: Risk for impaired skin integrity will decrease Outcome: Adequate for Discharge   Problem: Education: Goal: Knowledge of the prescribed therapeutic regimen will improve Outcome: Adequate for Discharge Goal: Understanding of sexual limitations or changes related to disease process or condition will  improve Outcome: Adequate for Discharge Goal: Individualized Educational Video(s) Outcome: Adequate for Discharge   Problem: Self-Concept: Goal: Communication of feelings regarding changes in body function or appearance will improve Outcome: Adequate for Discharge   Problem: Skin Integrity: Goal: Demonstration of wound healing without infection will improve Outcome: Adequate for Discharge   Problem: Education: Goal: Knowledge of condition will improve Outcome: Adequate for Discharge Goal: Individualized Educational Video(s) Outcome: Adequate for Discharge   Problem: Activity: Goal: Will verbalize the importance of balancing activity with adequate rest periods Outcome: Adequate for Discharge Goal: Ability to tolerate increased activity will improve Outcome: Adequate for Discharge   Problem: Coping: Goal: Ability to identify and utilize available resources and services will improve Outcome: Adequate for Discharge   Problem: Life Cycle: Goal: Chance of risk for complications during the postpartum period will decrease Outcome: Adequate for Discharge   Problem: Skin Integrity: Goal: Demonstration of wound healing without infection will improve Outcome: Adequate for Discharge

## 2024-01-28 NOTE — Progress Notes (Signed)
 CSW followed up with MOB at bedside to provide requested resources. MOB confirmed needing assistance with housing and agreed to referral. CSW made a referral for [employer] via [employer] tool and shared that information with MOB. MOB confirmed still needing assistance with cremation cost and requested that CSW assist with [employer] application. CSW contacted [employer] to inquire about available funds, no answer. CSW left a voicemail requesting return phone call. CSW completed [employer] application with MOB's permission and assistance. MOB thanked CSW. CSW inquired about any additional needs, MOB reported needing a taxi voucher for transportation. CSW confirmed MOB's address and agreed to provide the taxi voucher to MOB's RN so she can call when MOB is ready, MOB thanked CSW and denied any additional needs.   CSW provided MOB with the following resources: [employer], [employer], Support Groups for grieving parents, and Perinatal Mental Health Discussion Tool for Parents Experiencing Loss.   CSW updated MOB's RN and provided completed taxi voucher.   CSW completed [facility] referral.   Suzen Law, LCSW Clinical Social Worker [facility] Cell#: [phone]

## 2024-01-28 NOTE — Progress Notes (Signed)
RA DL PICC removed per protocol per MD order. Manual pressure applied for 3 mins. Vaseline gauze, gauze, and Tegaderm applied over insertion site. No bleeding or swelling noted. Instructed patient to remain in bed for thirty mins. Educated patient about S/S of infection and when to call MD; no heavy lifting or pressure on right side for 24 hours; keep dressing dry and intact for 24 hours. Pt verbalized comprehension.

## 2024-01-28 NOTE — Progress Notes (Signed)
   01/28/24 1423  Departure Condition  Departure Condition Good  Mobility at [employer]  Patient/Caregiver Teaching Teach Back Method Used;Discharge instructions reviewed;Prescriptions reviewed;Follow-up care reviewed;Patient/caregiver verbalized understanding;Medications discussed;Pain management discussed;Educated about hypertension in pregnancy  Departure Mode With family  Was procedural sedation performed on this patient during this visit? No   Patient alert and oriented x4 and pain stable at discharge.

## 2024-02-02 ENCOUNTER — Other Ambulatory Visit: Payer: Self-pay

## 2024-02-02 ENCOUNTER — Other Ambulatory Visit (HOSPITAL_COMMUNITY): Payer: Self-pay

## 2024-02-02 ENCOUNTER — Ambulatory Visit (INDEPENDENT_AMBULATORY_CARE_PROVIDER_SITE_OTHER)

## 2024-02-02 DIAGNOSIS — N643 Galactorrhea not associated with childbirth: Secondary | ICD-10-CM

## 2024-02-02 DIAGNOSIS — Z4889 Encounter for other specified surgical aftercare: Secondary | ICD-10-CM

## 2024-02-02 MED ORDER — CABERGOLINE 0.5 MG PO TABS
0.5000 mg | ORAL_TABLET | Freq: Once | ORAL | 0 refills | Status: AC
  Filled 2024-02-02: qty 8, 8d supply, fill #0

## 2024-02-02 NOTE — Progress Notes (Signed)
 Incision Check Visit  [name] is here for incision check following primary c-section for PPROM and breech position on 01/25/24 with neonatal demise at delivery.   Assessment:  Honeycomb dressing removed by RN.  Incision appeared dry, approximated, no signs of infections noted.  Concerns Addressed:  Pt reported her breast feel full and are very sore.  With pt permission, RN assessed both breasts.  Both breast very firm, enlarged and pt obviously uncomfortable with light touch.  She reports she had pumped once since delivery and has not taken any other measures to soothe or empty her breasts thus far but has had leakage.  Pt open to receiving help and education with this issue.  [facility] visited with pt at bedside in office.   Education: Reviewed good wound care and s/s of infection with patient.  Patient will follow up at her post partum visit  on 03/06/24 at 1:55 with Dr. Ilean.  Pt verbalized understanding with no further questions.  Waddell LITTIE Burows, RN

## 2024-02-02 NOTE — Progress Notes (Signed)
 Patient with worsening engorgement since loss of infant son from 7/15. Breasts physically swollen and mom reports worsening pain. She has been wearing a sports bra and limiting stimulation.   Reviewed:  Ice packs to both breasts for 20 minutes at a time through a shirt or bra, can apply every 2-3 hours Cold crumpled cabbage leaves to bra and to change when wilted Limit stimulation to the breast, ok to leak in the shower Sage tea or capsules Altoids ad lib Wear supportive bra  Dr. Eldonna Prescribed 1 dosage of Cabergoline  to dry up milk. Patient informed to pick up at [facility].

## 2024-02-02 NOTE — Progress Notes (Signed)
 Consulted due to engorgement/hyperlactation after neonatal demise Offered Cabergoline , accepted Sent to [facility] pharmacy Follow up scheduled at 4 weeks for pp visit

## 2024-02-02 NOTE — BH Specialist Note (Unsigned)
 Integrated Behavioral Health via Telemedicine Visit  02/04/2024 [name] [id number]  Number of Integrated Behavioral Health Clinician visits: 1- Initial Visit  Session Start time: 1015   Session End time: 1043  Total time in minutes: 28    Referring Provider: Kieth Carolin, MD Patient/Family location: Home [facility] Provider location: Center for [facility] at [facility]  All persons participating in visit: [name] and [facility] Jameil Whitmoyer   Types of Service: Individual psychotherapy and Video visit  I connected with Brandee M Zhong and/or Rion M Treiber's n/a via  Telephone or Video Enabled Telemedicine Application  (Video is Caregility application) and verified that I am speaking with the correct person using two identifiers. Discussed confidentiality: Yes   I discussed the limitations of telemedicine and the availability of in person appointments.  Discussed there is a possibility of technology failure and discussed alternative modes of communication if that failure occurs.  I discussed that engaging in this telemedicine visit, they consent to the provision of behavioral healthcare and the services will be billed under their insurance.  Patient and/or legal guardian expressed understanding and consented to Telemedicine visit: Yes   Presenting Concerns: Patient and/or family reports the following symptoms/concerns: Pt requests Ambien  (worked well in the hospital) to help her sleep, for short-term usage; very difficult to fall asleep after neonatal demise. Pt is coping best by good support at home from her mother, as well as looking forward to holding her first grandchild (via her 15yo daughter) and will attend initial [facility] therapy session soon.  Duration of problem: less than two weeks  Patient and/or Family's Strengths/Protective Factors: Social connections, Concrete supports in place (healthy food, safe  environments, etc.), and Sense of purpose  Goals Addressed: Patient will:  Reduce symptoms of: anxiety and depression   Increase knowledge and/or ability of: healthy habits   Demonstrate ability to: Increase healthy adjustment to current life circumstances, Increase adequate support systems for patient/family, and Begin healthy grieving over loss  Progress towards Goals: Ongoing    Interventions: Interventions utilized:  Supportive Counseling, Psychoeducation and/or Health Education, and Link to [employer] Standardized Assessments completed: GAD-7 and PHQ 9    Patient and/or Family Response: Patient agrees with treatment plan.   Clinical Assessment/Diagnosis  Grief .   Patient may benefit from psychoeducation and brief therapeutic interventions regarding coping with symptoms of anxiety and depression related to grief .  Plan: Follow up with behavioral health clinician on : Call [name] at [phone], as needed. Behavioral recommendations:  -Continue taking Lexapro  and hydroxyzine  as prescribed; provider has been notified about request for Ambien  for sleep -Continue prioritizing healthy self-care (regular meals, adequate rest; allowing practical help from supportive friends and family) until at least postpartum medical appointment -Continue plan to attend initial [facility] session  -Read through information on After Visit Summary; use as needed and discussed   Referral(s): Integrated [employer] (In Clinic) and [employer]:  family support  I discussed the assessment and treatment plan with the patient and/or parent/guardian. They were provided an opportunity to ask questions and all were answered. They agreed with the plan and demonstrated an understanding of the instructions.   They were advised to call back or seek an in-person evaluation if the symptoms worsen or if the condition fails to improve as anticipated.  Jeanette JAYSON Mering, LCSW     02/03/2024   10:28 AM 11/24/2023    4:30 PM  Depression screen [facility] 2/9  Decreased Interest 3 2  Down, Depressed, Hopeless 3 0  PHQ - 2 Score 6 2  Altered sleeping 3 3  Tired, decreased energy 3 3  Change in appetite 0 2  Feeling bad or failure about yourself  3 0  Trouble concentrating 3 2  Moving slowly or fidgety/restless 1 0  Suicidal thoughts 0 0  PHQ-9 Score 19 12      02/03/2024   10:30 AM 11/24/2023    4:30 PM  GAD 7 : Generalized Anxiety Score  Nervous, Anxious, on Edge 1 1  Control/stop worrying 3 1  Worry too much - different things 3 1  Trouble relaxing 3 1  Restless 3 0  Easily annoyed or irritable 0 1  Afraid - awful might happen 0 0  Total GAD 7 Score 13 5

## 2024-02-03 ENCOUNTER — Ambulatory Visit: Payer: Self-pay | Admitting: Clinical

## 2024-02-03 DIAGNOSIS — F4321 Adjustment disorder with depressed mood: Secondary | ICD-10-CM

## 2024-02-04 ENCOUNTER — Other Ambulatory Visit: Payer: Self-pay | Admitting: Advanced Practice Midwife

## 2024-02-04 DIAGNOSIS — F4321 Adjustment disorder with depressed mood: Secondary | ICD-10-CM

## 2024-02-04 DIAGNOSIS — G4700 Insomnia, unspecified: Secondary | ICD-10-CM

## 2024-02-04 MED ORDER — ZOLPIDEM TARTRATE 5 MG PO TABS: 5.0000 mg | ORAL_TABLET | Freq: Every evening | ORAL | 0 refills | Status: AC | PRN

## 2024-02-04 NOTE — Patient Instructions (Signed)
 Center for [facility] at [facility] [address] [place], [place] [address] [phone] (main office) [phone] (Nijae Doyel's office)  For Daughter:  [email or web address]  [facility] [employer] [email or web address]

## 2024-02-04 NOTE — Progress Notes (Signed)
 Ambien  5 mg Q HS for sleep sent to pt pharmacy at request of integrated behavioral health provider, [facility].  Pt with insomnia related to recent loss.

## 2024-02-05 ENCOUNTER — Other Ambulatory Visit (HOSPITAL_COMMUNITY): Payer: Self-pay

## 2024-02-15 ENCOUNTER — Other Ambulatory Visit

## 2024-02-15 ENCOUNTER — Encounter: Admitting: Obstetrics and Gynecology

## 2024-02-29 ENCOUNTER — Encounter: Admitting: Obstetrics and Gynecology

## 2024-03-06 ENCOUNTER — Ambulatory Visit: Admitting: Family Medicine

## 2024-03-27 ENCOUNTER — Encounter (HOSPITAL_COMMUNITY): Payer: Self-pay | Admitting: Obstetrics and Gynecology

## 2024-04-04 ENCOUNTER — Other Ambulatory Visit: Payer: Self-pay

## 2024-04-04 ENCOUNTER — Emergency Department (HOSPITAL_COMMUNITY)
Admission: EM | Admit: 2024-04-04 | Discharge: 2024-04-05 | Discharge status: Against Medical Advice | Attending: Emergency Medicine | Admitting: Emergency Medicine

## 2024-04-04 ENCOUNTER — Encounter (HOSPITAL_COMMUNITY): Payer: Self-pay | Admitting: Emergency Medicine

## 2024-04-04 DIAGNOSIS — R110 Nausea: Secondary | ICD-10-CM | POA: Diagnosis not present

## 2024-04-04 DIAGNOSIS — Z5321 Procedure and treatment not carried out due to patient leaving prior to being seen by health care provider: Secondary | ICD-10-CM | POA: Insufficient documentation

## 2024-04-04 DIAGNOSIS — R1012 Left upper quadrant pain: Secondary | ICD-10-CM | POA: Insufficient documentation

## 2024-04-04 LAB — CBC
HCT: 37.2 % (ref 36.0–46.0)
Hemoglobin: 11.6 g/dL — ABNORMAL LOW (ref 12.0–15.0)
MCH: 26.4 pg (ref 26.0–34.0)
MCHC: 31.2 g/dL (ref 30.0–36.0)
MCV: 84.5 fL (ref 80.0–100.0)
Platelets: 322 K/uL (ref 150–400)
RBC: 4.4 MIL/uL (ref 3.87–5.11)
RDW: 14.6 % (ref 11.5–15.5)
WBC: 6.2 K/uL (ref 4.0–10.5)
nRBC: 0 % (ref 0.0–0.2)

## 2024-04-04 LAB — URINALYSIS, ROUTINE W REFLEX MICROSCOPIC
Bacteria, UA: NONE SEEN
Bilirubin Urine: NEGATIVE
Glucose, UA: NEGATIVE mg/dL
Ketones, ur: NEGATIVE mg/dL
Nitrite: NEGATIVE
Protein, ur: NEGATIVE mg/dL
Specific Gravity, Urine: 1.015 (ref 1.005–1.030)
pH: 5 (ref 5.0–8.0)

## 2024-04-04 LAB — COMPREHENSIVE METABOLIC PANEL WITH GFR
ALT: 16 U/L (ref 0–44)
AST: 19 U/L (ref 15–41)
Albumin: 3.6 g/dL (ref 3.5–5.0)
Alkaline Phosphatase: 61 U/L (ref 38–126)
Anion gap: 8 (ref 5–15)
BUN: 10 mg/dL (ref 6–20)
CO2: 23 mmol/L (ref 22–32)
Calcium: 9.4 mg/dL (ref 8.9–10.3)
Chloride: 106 mmol/L (ref 98–111)
Creatinine, Ser: 0.97 mg/dL (ref 0.44–1.00)
GFR, Estimated: >60 mL/min (ref >60)
Glucose, Bld: 109 mg/dL — ABNORMAL HIGH (ref 70–99)
Potassium: 4.3 mmol/L (ref 3.5–5.1)
Sodium: 137 mmol/L (ref 135–145)
Total Bilirubin: 0.5 mg/dL (ref 0.0–1.2)
Total Protein: 6.9 g/dL (ref 6.5–8.1)

## 2024-04-04 LAB — HCG, SERUM, QUALITATIVE: Preg, Serum: NEGATIVE

## 2024-04-04 NOTE — ED Notes (Signed)
Pt called x 2 

## 2024-04-04 NOTE — ED Triage Notes (Signed)
 Pt complains of LUQ pain started 45 mins ago. Radiates around to back. Nausea no vomiting.

## 2024-04-05 LAB — LIPASE, BLOOD: Lipase: 22 U/L (ref 11–51)

## 2024-04-05 NOTE — ED Notes (Signed)
Pt called x3 no response.  

## 2024-05-15 ENCOUNTER — Other Ambulatory Visit: Payer: Self-pay

## 2024-05-15 ENCOUNTER — Emergency Department (HOSPITAL_COMMUNITY)

## 2024-05-15 ENCOUNTER — Encounter (HOSPITAL_COMMUNITY): Payer: Self-pay | Admitting: Emergency Medicine

## 2024-05-15 ENCOUNTER — Emergency Department (HOSPITAL_COMMUNITY)
Admission: EM | Admit: 2024-05-15 | Discharge: 2024-05-16 | Discharge status: Against Medical Advice | Attending: Emergency Medicine | Admitting: Emergency Medicine

## 2024-05-15 DIAGNOSIS — M25512 Pain in left shoulder: Secondary | ICD-10-CM | POA: Insufficient documentation

## 2024-05-15 DIAGNOSIS — Z5321 Procedure and treatment not carried out due to patient leaving prior to being seen by health care provider: Secondary | ICD-10-CM | POA: Diagnosis not present

## 2024-05-15 MED ORDER — OXYCODONE HCL 5 MG PO TABS
5.0000 mg | ORAL_TABLET | Freq: Once | ORAL | Status: AC
  Administered 2024-05-15: 5 mg via ORAL
  Filled 2024-05-15: qty 1

## 2024-05-15 NOTE — ED Triage Notes (Signed)
 Pt arrive by [facility] from home for c/o left shoulder pain, no recent fall or injury, pain started 45 min pta to ED.

## 2024-05-15 NOTE — ED Provider Triage Note (Signed)
 Emergency Medicine Provider Triage Evaluation Note  [name] , a 33 y.o. female  was evaluated in triage.  Pt complains of left shoulder pain. Thinks she turnt wrong earlier this morning. Has not taken otc analgesia pta. Denies head injury, LOC  Review of Systems  Positive: See hpi Negative:   Physical Exam  BP (!) 133/90 (BP Location: Right Arm)   Pulse (!) 108   Temp 98.9 F (37.2 C)   Resp 16   SpO2 99%  Gen:   Awake, no distress   Resp:  Normal effort  MSK:   Moves extremities without difficulty  Other:  TTP of cervical spine, left shoulder and left elbow pain. No obvious deformity nor swelling. Radial 2+. Reports paresthesia to left digits 3-5  Medical Decision Making  Medically screening exam initiated at 9:22 PM.  Appropriate orders placed.  [name] was informed that the remainder of the evaluation will be completed by another provider, this initial triage assessment does not replace that evaluation, and the importance of remaining in the ED until their evaluation is complete.  XR, CT neck, and analgesia ordered   Jeanette Tinnie BRAVO, PA 05/15/24 2125

## 2024-05-16 NOTE — ED Notes (Signed)
 Pt name called for updated vitals, no response
